# Patient Record
Sex: Female | Born: 1940
Health system: Southern US, Community
[De-identification: ages and names within clinical notes are randomized; demographics above are authoritative.]

## PROBLEM LIST (undated history)

## (undated) DIAGNOSIS — E7800 Pure hypercholesterolemia, unspecified: Secondary | ICD-10-CM

## (undated) DIAGNOSIS — I10 Essential (primary) hypertension: Secondary | ICD-10-CM

---

## 1997-08-29 ENCOUNTER — Other Ambulatory Visit: Admission: RE | Admit: 1997-08-29 | Discharge: 1997-08-29 | Payer: Self-pay | Admitting: Cardiology

## 1998-08-31 ENCOUNTER — Other Ambulatory Visit: Admission: RE | Admit: 1998-08-31 | Discharge: 1998-08-31 | Payer: Self-pay | Admitting: Cardiology

## 1999-09-17 ENCOUNTER — Other Ambulatory Visit: Admission: RE | Admit: 1999-09-17 | Discharge: 1999-09-17 | Payer: Self-pay | Admitting: Internal Medicine

## 1999-09-21 ENCOUNTER — Encounter: Payer: Self-pay | Admitting: Internal Medicine

## 1999-09-21 ENCOUNTER — Encounter: Admission: RE | Admit: 1999-09-21 | Discharge: 1999-09-21 | Payer: Self-pay | Admitting: Internal Medicine

## 2000-03-08 ENCOUNTER — Other Ambulatory Visit: Admission: RE | Admit: 2000-03-08 | Discharge: 2000-03-08 | Payer: Self-pay | Admitting: Internal Medicine

## 2000-05-31 ENCOUNTER — Ambulatory Visit (HOSPITAL_COMMUNITY): Admission: RE | Admit: 2000-05-31 | Discharge: 2000-05-31 | Payer: Self-pay | Admitting: Gastroenterology

## 2000-10-23 ENCOUNTER — Other Ambulatory Visit: Admission: RE | Admit: 2000-10-23 | Discharge: 2000-10-23 | Payer: Self-pay | Admitting: *Deleted

## 2000-10-25 ENCOUNTER — Ambulatory Visit (HOSPITAL_COMMUNITY): Admission: RE | Admit: 2000-10-25 | Discharge: 2000-10-25 | Payer: Self-pay | Admitting: *Deleted

## 2001-08-06 ENCOUNTER — Ambulatory Visit (HOSPITAL_COMMUNITY): Admission: RE | Admit: 2001-08-06 | Discharge: 2001-08-06 | Payer: Self-pay | Admitting: *Deleted

## 2001-10-01 ENCOUNTER — Encounter: Payer: Self-pay | Admitting: Gastroenterology

## 2001-10-01 ENCOUNTER — Encounter: Admission: RE | Admit: 2001-10-01 | Discharge: 2001-10-01 | Payer: Self-pay | Admitting: Gastroenterology

## 2002-04-18 ENCOUNTER — Other Ambulatory Visit: Admission: RE | Admit: 2002-04-18 | Discharge: 2002-04-18 | Payer: Self-pay | Admitting: *Deleted

## 2002-05-01 ENCOUNTER — Ambulatory Visit (HOSPITAL_BASED_OUTPATIENT_CLINIC_OR_DEPARTMENT_OTHER): Admission: RE | Admit: 2002-05-01 | Discharge: 2002-05-01 | Payer: Self-pay | Admitting: Orthopedic Surgery

## 2002-10-03 ENCOUNTER — Encounter: Payer: Self-pay | Admitting: Internal Medicine

## 2002-10-03 ENCOUNTER — Encounter: Admission: RE | Admit: 2002-10-03 | Discharge: 2002-10-03 | Payer: Self-pay | Admitting: Internal Medicine

## 2003-03-26 ENCOUNTER — Encounter: Admission: RE | Admit: 2003-03-26 | Discharge: 2003-03-26 | Payer: Self-pay | Admitting: Internal Medicine

## 2003-04-30 ENCOUNTER — Other Ambulatory Visit: Admission: RE | Admit: 2003-04-30 | Discharge: 2003-04-30 | Payer: Self-pay | Admitting: *Deleted

## 2004-04-05 ENCOUNTER — Encounter: Admission: RE | Admit: 2004-04-05 | Discharge: 2004-04-05 | Payer: Self-pay | Admitting: Internal Medicine

## 2005-04-08 ENCOUNTER — Encounter: Admission: RE | Admit: 2005-04-08 | Discharge: 2005-04-08 | Payer: Self-pay | Admitting: Internal Medicine

## 2006-04-07 ENCOUNTER — Encounter: Admission: RE | Admit: 2006-04-07 | Discharge: 2006-04-07 | Payer: Self-pay | Admitting: Internal Medicine

## 2009-12-30 ENCOUNTER — Encounter
Admission: RE | Admit: 2009-12-30 | Discharge: 2009-12-30 | Payer: Self-pay | Source: Home / Self Care | Attending: Internal Medicine | Admitting: Internal Medicine

## 2010-02-07 ENCOUNTER — Encounter: Payer: Self-pay | Admitting: Internal Medicine

## 2010-11-29 ENCOUNTER — Other Ambulatory Visit: Payer: Self-pay | Admitting: Dermatology

## 2011-02-25 ENCOUNTER — Other Ambulatory Visit: Payer: Self-pay | Admitting: Gastroenterology

## 2013-04-18 ENCOUNTER — Other Ambulatory Visit: Payer: Self-pay | Admitting: Internal Medicine

## 2013-04-18 DIAGNOSIS — R1032 Left lower quadrant pain: Secondary | ICD-10-CM

## 2013-04-24 ENCOUNTER — Ambulatory Visit
Admission: RE | Admit: 2013-04-24 | Discharge: 2013-04-24 | Disposition: A | Discharge status: Home / Self Care | Payer: Medicare Other | Source: Ambulatory Visit | Attending: Internal Medicine | Admitting: Internal Medicine

## 2013-04-24 DIAGNOSIS — R1032 Left lower quadrant pain: Secondary | ICD-10-CM

## 2013-04-24 IMAGING — CT CT ABD-PELV W/ CM
3 of 5 series · 12 of 36 positions shown, 18 images · IV contrast (READICAT/WATER & [ID] OMNI 300)
Comparison: None.

CLINICAL DATA: Left lower quadrant abdominal pain, constipation,
negative colonoscopy 2 years ago

EXAM:
CT ABDOMEN AND PELVIS WITH CONTRAST
TECHNIQUE: Multidetector CT imaging of the abdomen and pelvis was performed
using the standard protocol following bolus administration of
intravenous contrast.
CONTRAST:  100mL OMNIPAQUE IOHEXOL 300 MG/ML  SOLN

[Series 3: abd/pelvis with · axial · 0.78mm/px · z∈[-312,-12]mm · 7 of 80 slices shown, 12 images]
[im 10/80  soft-tissue]
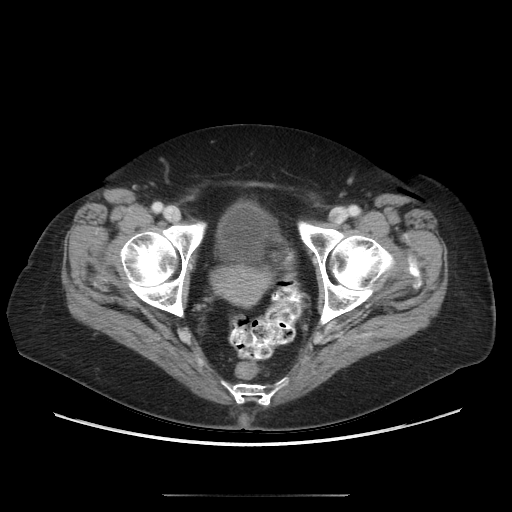
[im 10/80  bone]
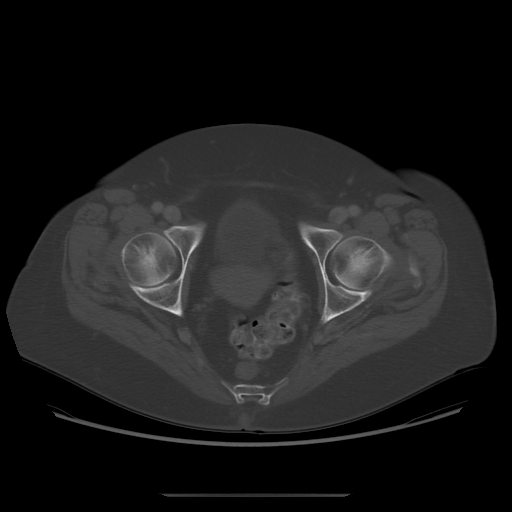
[im 20/80  soft-tissue]
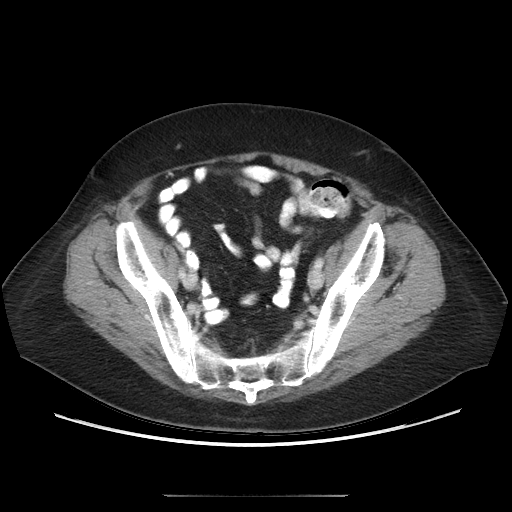
[im 30/80  soft-tissue]
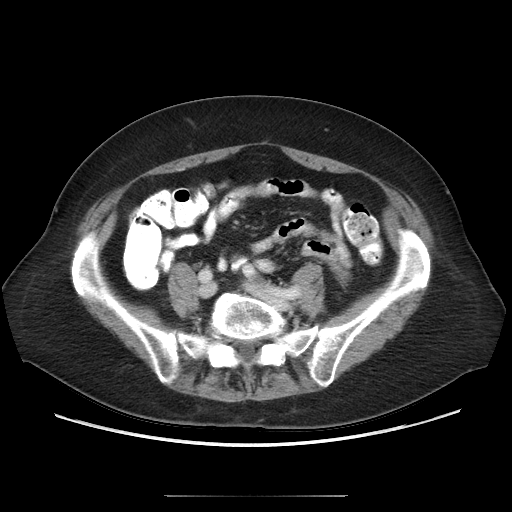
[im 40/80  soft-tissue]
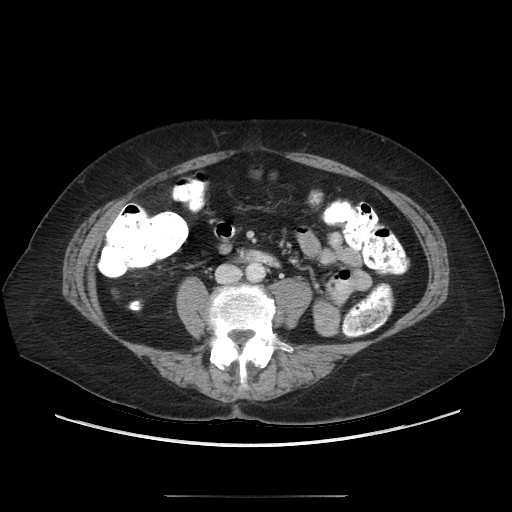
[im 40/80  lung]
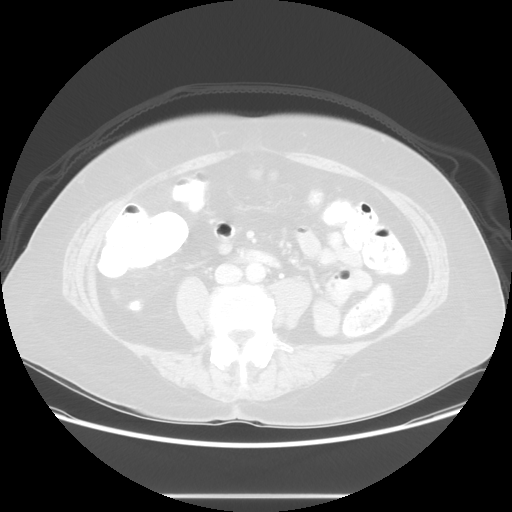
[im 50/80  soft-tissue]
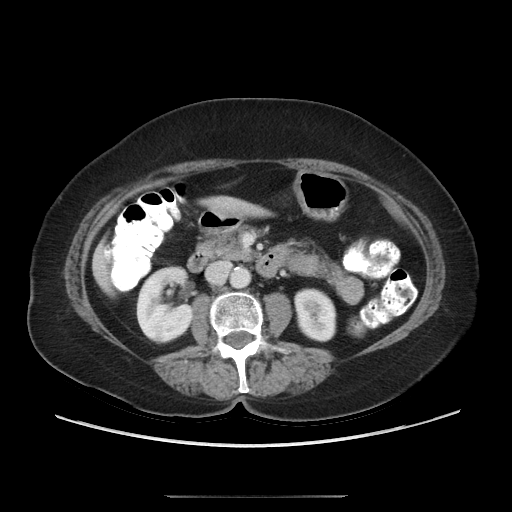
[im 50/80  lung]
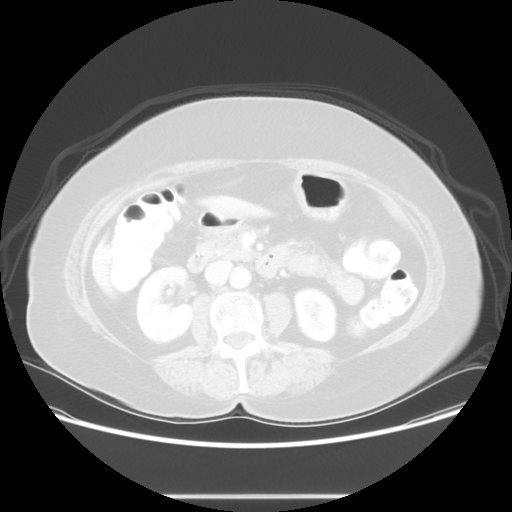
[im 60/80  soft-tissue]
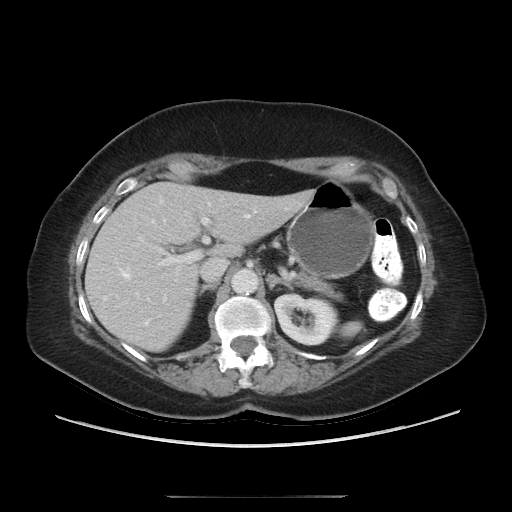
[im 60/80  lung]
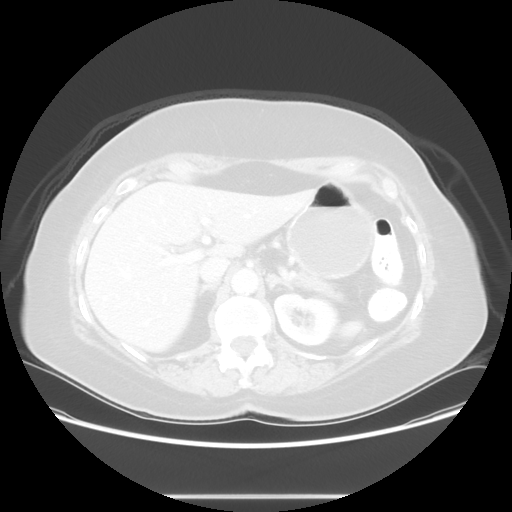
[im 70/80  soft-tissue]
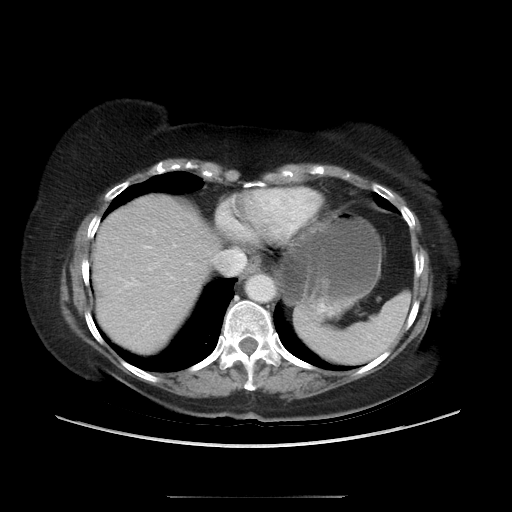
[im 70/80  lung]
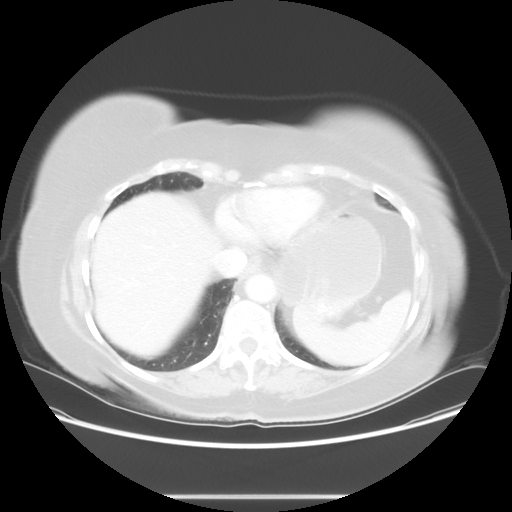

[Series 601: coronal body · coronal · 0.84mm/px · 1 of 114 slices shown, 2 images]
[im 38/114  soft-tissue]
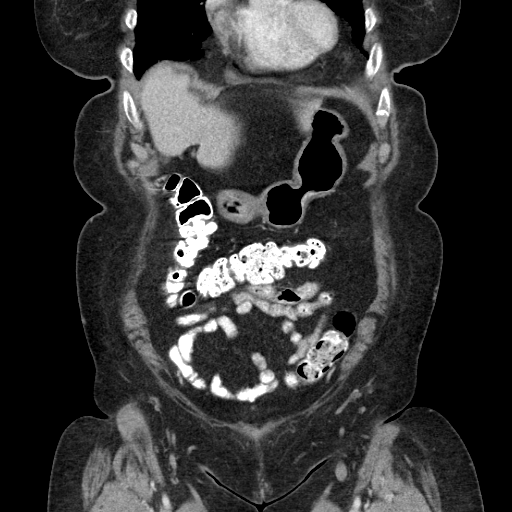
[im 38/114  bone]
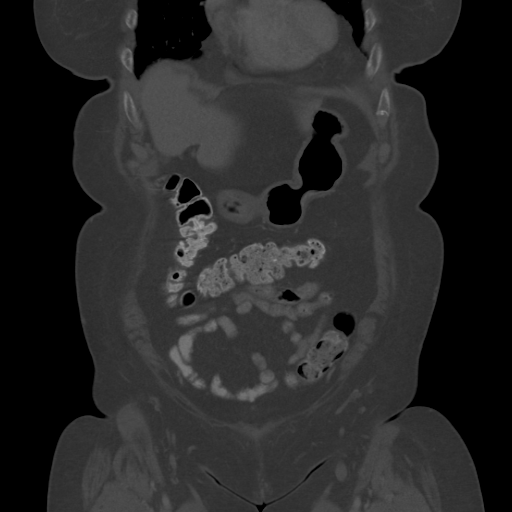

[Series 602: sagittal body · sagittal · 0.84mm/px · 4 of 159 slices shown]
[im 19/159  soft-tissue]
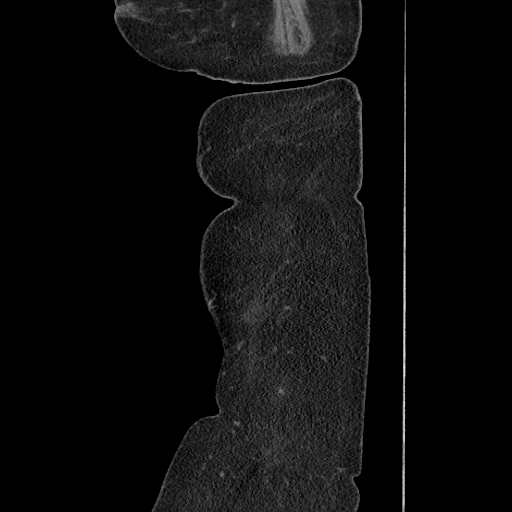
[im 38/159  soft-tissue]
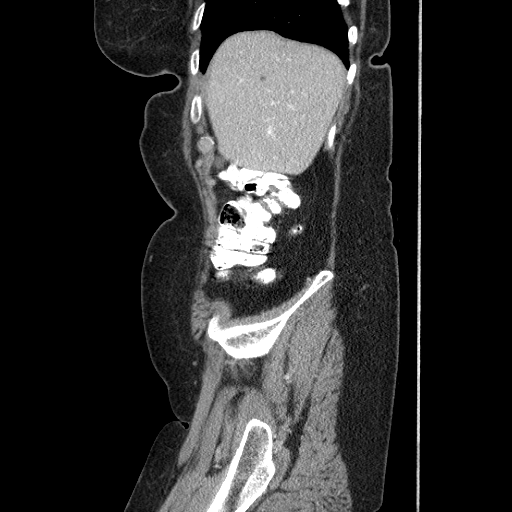
[im 56/159  soft-tissue]
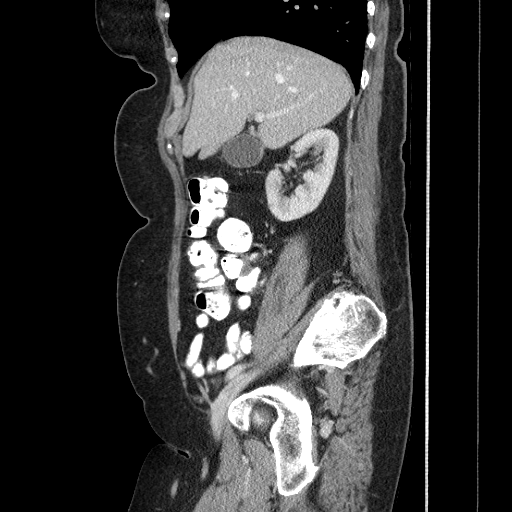
[im 75/159  soft-tissue]
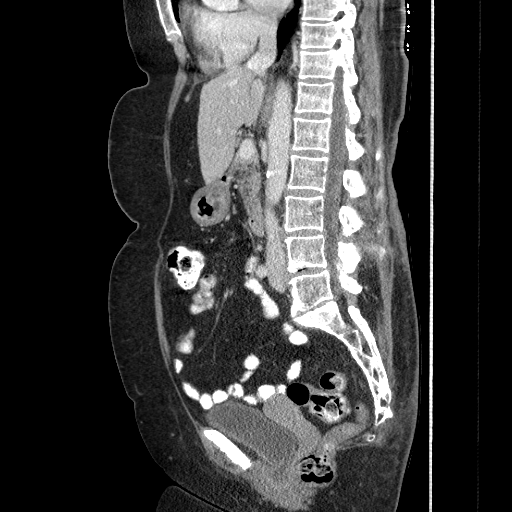

[12 of 36 positions shown; findings below may reference images not displayed]

FINDINGS: The lung bases are clear. The liver enhances with only a single sub
cm low-attenuation structure near the dome of the right lobe most
consistent with a benign process such as a small cyst. No calcified
gallstones are seen, but there are small soft tissue densities
layering dependently in the neck of the gallbladder which could
represent noncalcified gallstones or polyps. The pancreas is normal
in size and the pancreatic duct is not dilated. The adrenal glands
and spleen are unremarkable. The stomach is moderately fluid
distended with no abnormality noted. The kidneys enhance with no
calculus, although there is a small cyst in the lower pole of the
right kidney laterally of 13 mm in diameter. The pelvocaliceal
systems appear unremarkable on the delayed images and the proximal
ureters are normal in caliber. The abdominal aorta is normal in
caliber with only mild atheromatous change present. No adenopathy is
seen.

The urinary bladder is not well distended but no abnormality is
seen. The uterus is normal in size and no adnexal lesion is seen. No
fluid is noted within the pelvis. The colon is not distended but no
gross abnormality is evident. The terminal ileum is unremarkable as
is the appendix. There is degenerative disc disease at both L4-5 and
L5-S1 levels.
IMPRESSION: 1. No explanation for the patient's left lower quadrant pain is
seen. No diverticular disease is evident.
2. The terminal ileum and appendix are unremarkable.
3. Degenerative disc disease is noted at L4-5 and L5-S1.
4. Small soft tissue densities layer within the neck of the
gallbladder may represent noncalcified gallstones. Consider
ultrasound if further assessment is warranted.

## 2013-04-24 MED ORDER — IOHEXOL 300 MG/ML  SOLN
100.0000 mL | Freq: Once | INTRAMUSCULAR | Status: AC | PRN
  Administered 2013-04-24: 100 mL via INTRAVENOUS

## 2016-07-08 ENCOUNTER — Other Ambulatory Visit (HOSPITAL_COMMUNITY): Payer: Self-pay | Admitting: Internal Medicine

## 2016-07-08 DIAGNOSIS — S32009S Unspecified fracture of unspecified lumbar vertebra, sequela: Secondary | ICD-10-CM

## 2016-07-11 ENCOUNTER — Ambulatory Visit (HOSPITAL_COMMUNITY): Payer: Medicare PPO

## 2016-07-12 ENCOUNTER — Ambulatory Visit (HOSPITAL_COMMUNITY): Payer: Medicare PPO

## 2016-07-13 ENCOUNTER — Ambulatory Visit (HOSPITAL_COMMUNITY)
Admission: RE | Admit: 2016-07-13 | Discharge: 2016-07-13 | Disposition: A | Discharge status: Home / Self Care | Payer: Medicare PPO | Source: Ambulatory Visit | Attending: Internal Medicine | Admitting: Internal Medicine

## 2016-07-13 DIAGNOSIS — S32009S Unspecified fracture of unspecified lumbar vertebra, sequela: Secondary | ICD-10-CM

## 2016-07-13 DIAGNOSIS — X58XXXA Exposure to other specified factors, initial encounter: Secondary | ICD-10-CM | POA: Diagnosis not present

## 2016-07-13 DIAGNOSIS — M5136 Other intervertebral disc degeneration, lumbar region: Secondary | ICD-10-CM | POA: Diagnosis not present

## 2016-07-13 DIAGNOSIS — S32009A Unspecified fracture of unspecified lumbar vertebra, initial encounter for closed fracture: Secondary | ICD-10-CM | POA: Insufficient documentation

## 2016-07-13 DIAGNOSIS — M48061 Spinal stenosis, lumbar region without neurogenic claudication: Secondary | ICD-10-CM | POA: Diagnosis not present

## 2018-03-30 DIAGNOSIS — J00 Acute nasopharyngitis [common cold]: Secondary | ICD-10-CM | POA: Diagnosis not present

## 2018-03-30 DIAGNOSIS — I10 Essential (primary) hypertension: Secondary | ICD-10-CM | POA: Diagnosis not present

## 2018-04-11 DIAGNOSIS — I959 Hypotension, unspecified: Secondary | ICD-10-CM | POA: Diagnosis not present

## 2018-04-11 DIAGNOSIS — Z0000 Encounter for general adult medical examination without abnormal findings: Secondary | ICD-10-CM | POA: Diagnosis not present

## 2018-04-11 DIAGNOSIS — E785 Hyperlipidemia, unspecified: Secondary | ICD-10-CM | POA: Diagnosis not present

## 2018-04-11 DIAGNOSIS — N390 Urinary tract infection, site not specified: Secondary | ICD-10-CM | POA: Diagnosis not present

## 2018-04-11 DIAGNOSIS — Z6827 Body mass index (BMI) 27.0-27.9, adult: Secondary | ICD-10-CM | POA: Diagnosis not present

## 2018-04-11 DIAGNOSIS — I10 Essential (primary) hypertension: Secondary | ICD-10-CM | POA: Diagnosis not present

## 2018-04-16 DIAGNOSIS — R32 Unspecified urinary incontinence: Secondary | ICD-10-CM | POA: Diagnosis not present

## 2018-04-16 DIAGNOSIS — Z8249 Family history of ischemic heart disease and other diseases of the circulatory system: Secondary | ICD-10-CM | POA: Diagnosis not present

## 2018-04-16 DIAGNOSIS — M810 Age-related osteoporosis without current pathological fracture: Secondary | ICD-10-CM | POA: Diagnosis not present

## 2018-04-16 DIAGNOSIS — I10 Essential (primary) hypertension: Secondary | ICD-10-CM | POA: Diagnosis not present

## 2018-04-16 DIAGNOSIS — Z882 Allergy status to sulfonamides status: Secondary | ICD-10-CM | POA: Diagnosis not present

## 2018-04-16 DIAGNOSIS — Z809 Family history of malignant neoplasm, unspecified: Secondary | ICD-10-CM | POA: Diagnosis not present

## 2018-04-16 DIAGNOSIS — E785 Hyperlipidemia, unspecified: Secondary | ICD-10-CM | POA: Diagnosis not present

## 2018-08-07 DIAGNOSIS — I10 Essential (primary) hypertension: Secondary | ICD-10-CM | POA: Diagnosis not present

## 2018-08-07 DIAGNOSIS — R946 Abnormal results of thyroid function studies: Secondary | ICD-10-CM | POA: Diagnosis not present

## 2018-08-07 DIAGNOSIS — E538 Deficiency of other specified B group vitamins: Secondary | ICD-10-CM | POA: Diagnosis not present

## 2018-08-07 DIAGNOSIS — E7849 Other hyperlipidemia: Secondary | ICD-10-CM | POA: Diagnosis not present

## 2018-08-08 DIAGNOSIS — I10 Essential (primary) hypertension: Secondary | ICD-10-CM | POA: Diagnosis not present

## 2018-08-08 DIAGNOSIS — R82998 Other abnormal findings in urine: Secondary | ICD-10-CM | POA: Diagnosis not present

## 2018-08-14 DIAGNOSIS — N281 Cyst of kidney, acquired: Secondary | ICD-10-CM | POA: Diagnosis not present

## 2018-08-14 DIAGNOSIS — E538 Deficiency of other specified B group vitamins: Secondary | ICD-10-CM | POA: Diagnosis not present

## 2018-08-14 DIAGNOSIS — E039 Hypothyroidism, unspecified: Secondary | ICD-10-CM | POA: Diagnosis not present

## 2018-08-14 DIAGNOSIS — S32009D Unspecified fracture of unspecified lumbar vertebra, subsequent encounter for fracture with routine healing: Secondary | ICD-10-CM | POA: Diagnosis not present

## 2018-08-14 DIAGNOSIS — Z0000 Encounter for general adult medical examination without abnormal findings: Secondary | ICD-10-CM | POA: Diagnosis not present

## 2018-08-14 DIAGNOSIS — Z1331 Encounter for screening for depression: Secondary | ICD-10-CM | POA: Diagnosis not present

## 2018-08-14 DIAGNOSIS — I868 Varicose veins of other specified sites: Secondary | ICD-10-CM | POA: Diagnosis not present

## 2018-08-14 DIAGNOSIS — D126 Benign neoplasm of colon, unspecified: Secondary | ICD-10-CM | POA: Diagnosis not present

## 2018-08-14 DIAGNOSIS — M810 Age-related osteoporosis without current pathological fracture: Secondary | ICD-10-CM | POA: Diagnosis not present

## 2018-08-23 ENCOUNTER — Other Ambulatory Visit (HOSPITAL_COMMUNITY): Payer: Self-pay | Admitting: *Deleted

## 2018-08-24 ENCOUNTER — Ambulatory Visit (HOSPITAL_COMMUNITY)
Admission: RE | Admit: 2018-08-24 | Discharge: 2018-08-24 | Disposition: A | Discharge status: Home / Self Care | Payer: Medicare HMO | Source: Ambulatory Visit | Attending: Internal Medicine | Admitting: Internal Medicine

## 2018-08-24 ENCOUNTER — Other Ambulatory Visit: Payer: Self-pay

## 2018-08-24 DIAGNOSIS — M810 Age-related osteoporosis without current pathological fracture: Secondary | ICD-10-CM | POA: Diagnosis not present

## 2018-08-24 MED ORDER — DENOSUMAB 60 MG/ML ~~LOC~~ SOSY
60.0000 mg | PREFILLED_SYRINGE | Freq: Once | SUBCUTANEOUS | Status: AC
  Administered 2018-08-24: 60 mg via SUBCUTANEOUS

## 2018-08-24 MED ORDER — DENOSUMAB 60 MG/ML ~~LOC~~ SOSY
PREFILLED_SYRINGE | SUBCUTANEOUS | Status: AC
  Filled 2018-08-24: qty 1

## 2018-08-24 NOTE — Discharge Instructions (Signed)
Denosumab injection °What is this medicine? °DENOSUMAB (den oh sue mab) slows bone breakdown. Prolia is used to treat osteoporosis in women after menopause and in men, and in people who are taking corticosteroids for 6 months or more. Xgeva is used to treat a high calcium level due to cancer and to prevent bone fractures and other bone problems caused by multiple myeloma or cancer bone metastases. Xgeva is also used to treat giant cell tumor of the bone. °This medicine may be used for other purposes; ask your health care provider or pharmacist if you have questions. °COMMON BRAND NAME(S): Prolia, XGEVA °What should I tell my health care provider before I take this medicine? °They need to know if you have any of these conditions: °· dental disease °· having surgery or tooth extraction °· infection °· kidney disease °· low levels of calcium or Vitamin D in the blood °· malnutrition °· on hemodialysis °· skin conditions or sensitivity °· thyroid or parathyroid disease °· an unusual reaction to denosumab, other medicines, foods, dyes, or preservatives °· pregnant or trying to get pregnant °· breast-feeding °How should I use this medicine? °This medicine is for injection under the skin. It is given by a health care professional in a hospital or clinic setting. °A special MedGuide will be given to you before each treatment. Be sure to read this information carefully each time. °For Prolia, talk to your pediatrician regarding the use of this medicine in children. Special care may be needed. For Xgeva, talk to your pediatrician regarding the use of this medicine in children. While this drug may be prescribed for children as young as 13 years for selected conditions, precautions do apply. °Overdosage: If you think you have taken too much of this medicine contact a poison control center or emergency room at once. °NOTE: This medicine is only for you. Do not share this medicine with others. °What if I miss a dose? °It is  important not to miss your dose. Call your doctor or health care professional if you are unable to keep an appointment. °What may interact with this medicine? °Do not take this medicine with any of the following medications: °· other medicines containing denosumab °This medicine may also interact with the following medications: °· medicines that lower your chance of fighting infection °· steroid medicines like prednisone or cortisone °This list may not describe all possible interactions. Give your health care provider a list of all the medicines, herbs, non-prescription drugs, or dietary supplements you use. Also tell them if you smoke, drink alcohol, or use illegal drugs. Some items may interact with your medicine. °What should I watch for while using this medicine? °Visit your doctor or health care professional for regular checks on your progress. Your doctor or health care professional may order blood tests and other tests to see how you are doing. °Call your doctor or health care professional for advice if you get a fever, chills or sore throat, or other symptoms of a cold or flu. Do not treat yourself. This drug may decrease your body's ability to fight infection. Try to avoid being around people who are sick. °You should make sure you get enough calcium and vitamin D while you are taking this medicine, unless your doctor tells you not to. Discuss the foods you eat and the vitamins you take with your health care professional. °See your dentist regularly. Brush and floss your teeth as directed. Before you have any dental work done, tell your dentist you are   receiving this medicine. Do not become pregnant while taking this medicine or for 5 months after stopping it. Talk with your doctor or health care professional about your birth control options while taking this medicine. Women should inform their doctor if they wish to become pregnant or think they might be pregnant. There is a potential for serious side  effects to an unborn child. Talk to your health care professional or pharmacist for more information. What side effects may I notice from receiving this medicine? Side effects that you should report to your doctor or health care professional as soon as possible:  allergic reactions like skin rash, itching or hives, swelling of the face, lips, or tongue  bone pain  breathing problems  dizziness  jaw pain, especially after dental work  redness, blistering, peeling of the skin  signs and symptoms of infection like fever or chills; cough; sore throat; pain or trouble passing urine  signs of low calcium like fast heartbeat, muscle cramps or muscle pain; pain, tingling, numbness in the hands or feet; seizures  unusual bleeding or bruising  unusually weak or tired Side effects that usually do not require medical attention (report to your doctor or health care professional if they continue or are bothersome):  constipation  diarrhea  headache  joint pain  loss of appetite  muscle pain  runny nose  tiredness  upset stomach This list may not describe all possible side effects. Call your doctor for medical advice about side effects. You may report side effects to FDA at 1-800-FDA-1088. Where should I keep my medicine? This medicine is only given in a clinic, doctor's office, or other health care setting and will not be stored at home. NOTE: This sheet is a summary. It may not cover all possible information. If you have questions about this medicine, talk to your doctor, pharmacist, or health care provider.  2020 Elsevier/Gold Standard (2017-05-12 16:10:44)

## 2018-10-05 DIAGNOSIS — Z23 Encounter for immunization: Secondary | ICD-10-CM | POA: Diagnosis not present

## 2018-10-08 DIAGNOSIS — B0089 Other herpesviral infection: Secondary | ICD-10-CM | POA: Diagnosis not present

## 2018-10-08 DIAGNOSIS — D692 Other nonthrombocytopenic purpura: Secondary | ICD-10-CM | POA: Diagnosis not present

## 2018-10-08 DIAGNOSIS — L821 Other seborrheic keratosis: Secondary | ICD-10-CM | POA: Diagnosis not present

## 2018-10-08 DIAGNOSIS — L738 Other specified follicular disorders: Secondary | ICD-10-CM | POA: Diagnosis not present

## 2018-10-29 DIAGNOSIS — Z1231 Encounter for screening mammogram for malignant neoplasm of breast: Secondary | ICD-10-CM | POA: Diagnosis not present

## 2018-11-07 DIAGNOSIS — R946 Abnormal results of thyroid function studies: Secondary | ICD-10-CM | POA: Diagnosis not present

## 2018-11-07 DIAGNOSIS — E538 Deficiency of other specified B group vitamins: Secondary | ICD-10-CM | POA: Diagnosis not present

## 2019-02-11 ENCOUNTER — Ambulatory Visit: Payer: BC Managed Care – PPO | Attending: Internal Medicine

## 2019-02-11 DIAGNOSIS — Z23 Encounter for immunization: Secondary | ICD-10-CM | POA: Insufficient documentation

## 2019-02-11 NOTE — Progress Notes (Signed)
   Covid-19 Vaccination Clinic  Name:  Melissa Tapia    MRN: [id number] DOB: 1940/09/18  02/11/2019  Ms. Melissa Tapia was observed post Covid-19 immunization for 15 minutes without incidence. She was provided with Vaccine Information Sheet and instruction to access the V-Safe system.   Ms. Melissa Tapia was instructed to call 911 with any severe reactions post vaccine: Marland Kitchen Difficulty breathing  . Swelling of your face and throat  . A fast heartbeat  . A bad rash all over your body  . Dizziness and weakness    Immunizations Administered    Name Date Dose VIS Date Route   Pfizer COVID-19 Vaccine 02/11/2019  8:56 AM 0.3 mL 12/28/2018 Intramuscular   Manufacturer: [place]   Lot: [id number]   [place]: [id number]

## 2019-02-15 DIAGNOSIS — M810 Age-related osteoporosis without current pathological fracture: Secondary | ICD-10-CM | POA: Diagnosis not present

## 2019-02-15 DIAGNOSIS — I10 Essential (primary) hypertension: Secondary | ICD-10-CM | POA: Diagnosis not present

## 2019-02-15 DIAGNOSIS — E039 Hypothyroidism, unspecified: Secondary | ICD-10-CM | POA: Diagnosis not present

## 2019-02-15 DIAGNOSIS — K5909 Other constipation: Secondary | ICD-10-CM | POA: Diagnosis not present

## 2019-02-15 DIAGNOSIS — K649 Unspecified hemorrhoids: Secondary | ICD-10-CM | POA: Diagnosis not present

## 2019-02-15 DIAGNOSIS — M5136 Other intervertebral disc degeneration, lumbar region: Secondary | ICD-10-CM | POA: Diagnosis not present

## 2019-02-15 DIAGNOSIS — I959 Hypotension, unspecified: Secondary | ICD-10-CM | POA: Diagnosis not present

## 2019-03-04 ENCOUNTER — Ambulatory Visit: Payer: BC Managed Care – PPO | Attending: Internal Medicine

## 2019-03-04 DIAGNOSIS — Z23 Encounter for immunization: Secondary | ICD-10-CM

## 2019-03-04 NOTE — Progress Notes (Signed)
   Covid-19 Vaccination Clinic  Name:  Melissa Tapia    MRN: [id number] DOB: Aug 12, 1940  03/04/2019  [name] was observed post Covid-19 immunization for 15 minutes without incidence. She was provided with Vaccine Information Sheet and instruction to access the V-Safe system.   [name] was instructed to call 911 with any severe reactions post vaccine: Marland Kitchen Difficulty breathing  . Swelling of your face and throat  . A fast heartbeat  . A bad rash all over your body  . Dizziness and weakness    Immunizations Administered    Name Date Dose VIS Date Route   Pfizer COVID-19 Vaccine 03/04/2019  8:24 AM 0.3 mL 12/28/2018 Intramuscular   Manufacturer: [place]   Lot: [id number]   [place]: [id number]

## 2019-03-25 ENCOUNTER — Other Ambulatory Visit (HOSPITAL_COMMUNITY): Payer: Self-pay | Admitting: *Deleted

## 2019-03-26 ENCOUNTER — Ambulatory Visit (HOSPITAL_COMMUNITY)
Admission: RE | Admit: 2019-03-26 | Discharge: 2019-03-26 | Disposition: A | Discharge status: Home / Self Care | Payer: Medicare HMO | Source: Ambulatory Visit | Attending: Internal Medicine | Admitting: Internal Medicine

## 2019-03-26 ENCOUNTER — Other Ambulatory Visit: Payer: Self-pay

## 2019-03-26 DIAGNOSIS — M810 Age-related osteoporosis without current pathological fracture: Secondary | ICD-10-CM | POA: Insufficient documentation

## 2019-03-26 MED ORDER — DENOSUMAB 60 MG/ML ~~LOC~~ SOSY
PREFILLED_SYRINGE | SUBCUTANEOUS | Status: AC
  Filled 2019-03-26: qty 1

## 2019-03-26 MED ORDER — DENOSUMAB 60 MG/ML ~~LOC~~ SOSY
60.0000 mg | PREFILLED_SYRINGE | Freq: Once | SUBCUTANEOUS | Status: AC
  Administered 2019-03-26: 10:00:00 60 mg via SUBCUTANEOUS

## 2019-04-16 DIAGNOSIS — I10 Essential (primary) hypertension: Secondary | ICD-10-CM | POA: Diagnosis not present

## 2019-04-16 DIAGNOSIS — R32 Unspecified urinary incontinence: Secondary | ICD-10-CM | POA: Diagnosis not present

## 2019-04-16 DIAGNOSIS — Z8249 Family history of ischemic heart disease and other diseases of the circulatory system: Secondary | ICD-10-CM | POA: Diagnosis not present

## 2019-04-16 DIAGNOSIS — Z809 Family history of malignant neoplasm, unspecified: Secondary | ICD-10-CM | POA: Diagnosis not present

## 2019-04-16 DIAGNOSIS — E785 Hyperlipidemia, unspecified: Secondary | ICD-10-CM | POA: Diagnosis not present

## 2019-04-16 DIAGNOSIS — K5900 Constipation, unspecified: Secondary | ICD-10-CM | POA: Diagnosis not present

## 2019-04-16 DIAGNOSIS — Z79899 Other long term (current) drug therapy: Secondary | ICD-10-CM | POA: Diagnosis not present

## 2019-04-16 DIAGNOSIS — M810 Age-related osteoporosis without current pathological fracture: Secondary | ICD-10-CM | POA: Diagnosis not present

## 2019-04-23 DIAGNOSIS — Z800 Family history of malignant neoplasm of digestive organs: Secondary | ICD-10-CM | POA: Diagnosis not present

## 2019-04-23 DIAGNOSIS — K921 Melena: Secondary | ICD-10-CM | POA: Diagnosis not present

## 2019-05-02 DIAGNOSIS — E538 Deficiency of other specified B group vitamins: Secondary | ICD-10-CM | POA: Diagnosis not present

## 2019-05-20 DIAGNOSIS — Z1159 Encounter for screening for other viral diseases: Secondary | ICD-10-CM | POA: Diagnosis not present

## 2019-05-23 DIAGNOSIS — K5730 Diverticulosis of large intestine without perforation or abscess without bleeding: Secondary | ICD-10-CM | POA: Diagnosis not present

## 2019-05-23 DIAGNOSIS — Z800 Family history of malignant neoplasm of digestive organs: Secondary | ICD-10-CM | POA: Diagnosis not present

## 2019-05-23 DIAGNOSIS — K921 Melena: Secondary | ICD-10-CM | POA: Diagnosis not present

## 2019-08-14 DIAGNOSIS — E7849 Other hyperlipidemia: Secondary | ICD-10-CM | POA: Diagnosis not present

## 2019-08-14 DIAGNOSIS — Z0000 Encounter for general adult medical examination without abnormal findings: Secondary | ICD-10-CM | POA: Diagnosis not present

## 2019-08-14 DIAGNOSIS — E038 Other specified hypothyroidism: Secondary | ICD-10-CM | POA: Diagnosis not present

## 2019-08-14 DIAGNOSIS — I10 Essential (primary) hypertension: Secondary | ICD-10-CM | POA: Diagnosis not present

## 2019-08-14 DIAGNOSIS — E538 Deficiency of other specified B group vitamins: Secondary | ICD-10-CM | POA: Diagnosis not present

## 2019-08-20 ENCOUNTER — Other Ambulatory Visit: Payer: Self-pay | Admitting: Internal Medicine

## 2019-08-20 DIAGNOSIS — E538 Deficiency of other specified B group vitamins: Secondary | ICD-10-CM | POA: Diagnosis not present

## 2019-08-20 DIAGNOSIS — E039 Hypothyroidism, unspecified: Secondary | ICD-10-CM | POA: Diagnosis not present

## 2019-08-20 DIAGNOSIS — N3281 Overactive bladder: Secondary | ICD-10-CM | POA: Diagnosis not present

## 2019-08-20 DIAGNOSIS — R82998 Other abnormal findings in urine: Secondary | ICD-10-CM | POA: Diagnosis not present

## 2019-08-20 DIAGNOSIS — M810 Age-related osteoporosis without current pathological fracture: Secondary | ICD-10-CM | POA: Diagnosis not present

## 2019-08-20 DIAGNOSIS — Z1212 Encounter for screening for malignant neoplasm of rectum: Secondary | ICD-10-CM | POA: Diagnosis not present

## 2019-08-20 DIAGNOSIS — L659 Nonscarring hair loss, unspecified: Secondary | ICD-10-CM | POA: Diagnosis not present

## 2019-08-20 DIAGNOSIS — I868 Varicose veins of other specified sites: Secondary | ICD-10-CM | POA: Diagnosis not present

## 2019-08-20 DIAGNOSIS — I10 Essential (primary) hypertension: Secondary | ICD-10-CM | POA: Diagnosis not present

## 2019-08-20 DIAGNOSIS — Z0000 Encounter for general adult medical examination without abnormal findings: Secondary | ICD-10-CM | POA: Diagnosis not present

## 2019-08-20 DIAGNOSIS — H9190 Unspecified hearing loss, unspecified ear: Secondary | ICD-10-CM | POA: Diagnosis not present

## 2019-08-21 ENCOUNTER — Other Ambulatory Visit: Payer: Self-pay | Admitting: Internal Medicine

## 2019-08-21 DIAGNOSIS — E785 Hyperlipidemia, unspecified: Secondary | ICD-10-CM

## 2019-09-04 ENCOUNTER — Other Ambulatory Visit: Payer: BC Managed Care – PPO

## 2019-09-04 ENCOUNTER — Ambulatory Visit
Admission: RE | Admit: 2019-09-04 | Discharge: 2019-09-04 | Disposition: A | Discharge status: Home / Self Care | Payer: BC Managed Care – PPO | Source: Ambulatory Visit | Attending: Internal Medicine | Admitting: Internal Medicine

## 2019-09-04 ENCOUNTER — Other Ambulatory Visit: Payer: Self-pay

## 2019-09-04 DIAGNOSIS — M810 Age-related osteoporosis without current pathological fracture: Secondary | ICD-10-CM

## 2019-09-04 DIAGNOSIS — Z780 Asymptomatic menopausal state: Secondary | ICD-10-CM | POA: Diagnosis not present

## 2019-09-04 DIAGNOSIS — M85852 Other specified disorders of bone density and structure, left thigh: Secondary | ICD-10-CM | POA: Diagnosis not present

## 2019-09-05 ENCOUNTER — Other Ambulatory Visit: Payer: BC Managed Care – PPO

## 2019-09-11 ENCOUNTER — Ambulatory Visit
Admission: RE | Admit: 2019-09-11 | Discharge: 2019-09-11 | Disposition: A | Discharge status: Home / Self Care | Payer: Self-pay | Source: Ambulatory Visit | Attending: Internal Medicine | Admitting: Internal Medicine

## 2019-09-11 DIAGNOSIS — E785 Hyperlipidemia, unspecified: Secondary | ICD-10-CM

## 2019-09-27 ENCOUNTER — Other Ambulatory Visit (HOSPITAL_COMMUNITY): Payer: Self-pay | Admitting: *Deleted

## 2019-09-30 ENCOUNTER — Ambulatory Visit (HOSPITAL_COMMUNITY)
Admission: RE | Admit: 2019-09-30 | Discharge: 2019-09-30 | Disposition: A | Discharge status: Home / Self Care | Payer: Medicare HMO | Source: Ambulatory Visit | Attending: Internal Medicine | Admitting: Internal Medicine

## 2019-09-30 ENCOUNTER — Other Ambulatory Visit: Payer: Self-pay

## 2019-09-30 DIAGNOSIS — M810 Age-related osteoporosis without current pathological fracture: Secondary | ICD-10-CM | POA: Insufficient documentation

## 2019-09-30 MED ORDER — DENOSUMAB 60 MG/ML ~~LOC~~ SOSY
PREFILLED_SYRINGE | SUBCUTANEOUS | Status: AC
  Administered 2019-09-30: 60 mg
  Filled 2019-09-30: qty 1

## 2019-09-30 MED ORDER — DENOSUMAB 60 MG/ML ~~LOC~~ SOSY: 60.0000 mg | PREFILLED_SYRINGE | Freq: Once | SUBCUTANEOUS | Status: DC

## 2019-09-30 NOTE — Discharge Instructions (Signed)
Denosumab injection What is this medicine? DENOSUMAB (den oh sue mab) slows bone breakdown. Prolia is used to treat osteoporosis in women after menopause and in men, and in people who are taking corticosteroids for 6 months or more. Xgeva is used to treat a high calcium level due to cancer and to prevent bone fractures and other bone problems caused by multiple myeloma or cancer bone metastases. Xgeva is also used to treat giant cell tumor of the bone. This medicine may be used for other purposes; ask your health care provider or pharmacist if you have questions. COMMON BRAND NAME(S): Prolia, XGEVA What should I tell my health care provider before I take this medicine? They need to know if you have any of these conditions:  dental disease  having surgery or tooth extraction  infection  kidney disease  low levels of calcium or Vitamin D in the blood  malnutrition  on hemodialysis  skin conditions or sensitivity  thyroid or parathyroid disease  an unusual reaction to denosumab, other medicines, foods, dyes, or preservatives  pregnant or trying to get pregnant  breast-feeding How should I use this medicine? This medicine is for injection under the skin. It is given by a health care professional in a hospital or clinic setting. A special MedGuide will be given to you before each treatment. Be sure to read this information carefully each time. For Prolia, talk to your pediatrician regarding the use of this medicine in children. Special care may be needed. For Xgeva, talk to your pediatrician regarding the use of this medicine in children. While this drug may be prescribed for children as young as 13 years for selected conditions, precautions do apply. Overdosage: If you think you have taken too much of this medicine contact a poison control center or emergency room at once. NOTE: This medicine is only for you. Do not share this medicine with others. What if I miss a dose? It is  important not to miss your dose. Call your doctor or health care professional if you are unable to keep an appointment. What may interact with this medicine? Do not take this medicine with any of the following medications:  other medicines containing denosumab This medicine may also interact with the following medications:  medicines that lower your chance of fighting infection  steroid medicines like prednisone or cortisone This list may not describe all possible interactions. Give your health care provider a list of all the medicines, herbs, non-prescription drugs, or dietary supplements you use. Also tell them if you smoke, drink alcohol, or use illegal drugs. Some items may interact with your medicine. What should I watch for while using this medicine? Visit your doctor or health care professional for regular checks on your progress. Your doctor or health care professional may order blood tests and other tests to see how you are doing. Call your doctor or health care professional for advice if you get a fever, chills or sore throat, or other symptoms of a cold or flu. Do not treat yourself. This drug may decrease your body's ability to fight infection. Try to avoid being around people who are sick. You should make sure you get enough calcium and vitamin D while you are taking this medicine, unless your doctor tells you not to. Discuss the foods you eat and the vitamins you take with your health care professional. See your dentist regularly. Brush and floss your teeth as directed. Before you have any dental work done, tell your dentist you are   receiving this medicine. Do not become pregnant while taking this medicine or for 5 months after stopping it. Talk with your doctor or health care professional about your birth control options while taking this medicine. Women should inform their doctor if they wish to become pregnant or think they might be pregnant. There is a potential for serious side  effects to an unborn child. Talk to your health care professional or pharmacist for more information. What side effects may I notice from receiving this medicine? Side effects that you should report to your doctor or health care professional as soon as possible:  allergic reactions like skin rash, itching or hives, swelling of the face, lips, or tongue  bone pain  breathing problems  dizziness  jaw pain, especially after dental work  redness, blistering, peeling of the skin  signs and symptoms of infection like fever or chills; cough; sore throat; pain or trouble passing urine  signs of low calcium like fast heartbeat, muscle cramps or muscle pain; pain, tingling, numbness in the hands or feet; seizures  unusual bleeding or bruising  unusually weak or tired Side effects that usually do not require medical attention (report to your doctor or health care professional if they continue or are bothersome):  constipation  diarrhea  headache  joint pain  loss of appetite  muscle pain  runny nose  tiredness  upset stomach This list may not describe all possible side effects. Call your doctor for medical advice about side effects. You may report side effects to FDA at 1-800-FDA-1088. Where should I keep my medicine? This medicine is only given in a clinic, doctor's office, or other health care setting and will not be stored at home. NOTE: This sheet is a summary. It may not cover all possible information. If you have questions about this medicine, talk to your doctor, pharmacist, or health care provider.  2020 Elsevier/Gold Standard (2017-05-12 16:10:44)

## 2019-10-16 DIAGNOSIS — L820 Inflamed seborrheic keratosis: Secondary | ICD-10-CM | POA: Diagnosis not present

## 2019-10-16 DIAGNOSIS — L821 Other seborrheic keratosis: Secondary | ICD-10-CM | POA: Diagnosis not present

## 2019-10-16 DIAGNOSIS — L738 Other specified follicular disorders: Secondary | ICD-10-CM | POA: Diagnosis not present

## 2019-10-19 DIAGNOSIS — Z23 Encounter for immunization: Secondary | ICD-10-CM | POA: Diagnosis not present

## 2019-10-29 DIAGNOSIS — M5116 Intervertebral disc disorders with radiculopathy, lumbar region: Secondary | ICD-10-CM | POA: Diagnosis not present

## 2019-10-29 DIAGNOSIS — M25552 Pain in left hip: Secondary | ICD-10-CM | POA: Diagnosis not present

## 2019-10-29 DIAGNOSIS — M9905 Segmental and somatic dysfunction of pelvic region: Secondary | ICD-10-CM | POA: Diagnosis not present

## 2019-10-29 DIAGNOSIS — M9903 Segmental and somatic dysfunction of lumbar region: Secondary | ICD-10-CM | POA: Diagnosis not present

## 2019-10-31 DIAGNOSIS — M25552 Pain in left hip: Secondary | ICD-10-CM | POA: Diagnosis not present

## 2019-10-31 DIAGNOSIS — M9905 Segmental and somatic dysfunction of pelvic region: Secondary | ICD-10-CM | POA: Diagnosis not present

## 2019-10-31 DIAGNOSIS — M9903 Segmental and somatic dysfunction of lumbar region: Secondary | ICD-10-CM | POA: Diagnosis not present

## 2019-10-31 DIAGNOSIS — M5116 Intervertebral disc disorders with radiculopathy, lumbar region: Secondary | ICD-10-CM | POA: Diagnosis not present

## 2019-11-04 DIAGNOSIS — M9903 Segmental and somatic dysfunction of lumbar region: Secondary | ICD-10-CM | POA: Diagnosis not present

## 2019-11-04 DIAGNOSIS — M5116 Intervertebral disc disorders with radiculopathy, lumbar region: Secondary | ICD-10-CM | POA: Diagnosis not present

## 2019-11-04 DIAGNOSIS — Z1231 Encounter for screening mammogram for malignant neoplasm of breast: Secondary | ICD-10-CM | POA: Diagnosis not present

## 2019-11-04 DIAGNOSIS — M9905 Segmental and somatic dysfunction of pelvic region: Secondary | ICD-10-CM | POA: Diagnosis not present

## 2019-11-04 DIAGNOSIS — M25552 Pain in left hip: Secondary | ICD-10-CM | POA: Diagnosis not present

## 2019-11-06 DIAGNOSIS — M5116 Intervertebral disc disorders with radiculopathy, lumbar region: Secondary | ICD-10-CM | POA: Diagnosis not present

## 2019-11-06 DIAGNOSIS — M9903 Segmental and somatic dysfunction of lumbar region: Secondary | ICD-10-CM | POA: Diagnosis not present

## 2019-11-06 DIAGNOSIS — M25552 Pain in left hip: Secondary | ICD-10-CM | POA: Diagnosis not present

## 2019-11-06 DIAGNOSIS — M9905 Segmental and somatic dysfunction of pelvic region: Secondary | ICD-10-CM | POA: Diagnosis not present

## 2019-11-11 DIAGNOSIS — M9905 Segmental and somatic dysfunction of pelvic region: Secondary | ICD-10-CM | POA: Diagnosis not present

## 2019-11-11 DIAGNOSIS — M9903 Segmental and somatic dysfunction of lumbar region: Secondary | ICD-10-CM | POA: Diagnosis not present

## 2019-11-11 DIAGNOSIS — M25552 Pain in left hip: Secondary | ICD-10-CM | POA: Diagnosis not present

## 2019-11-11 DIAGNOSIS — M5116 Intervertebral disc disorders with radiculopathy, lumbar region: Secondary | ICD-10-CM | POA: Diagnosis not present

## 2019-11-12 DIAGNOSIS — M5116 Intervertebral disc disorders with radiculopathy, lumbar region: Secondary | ICD-10-CM | POA: Diagnosis not present

## 2019-11-12 DIAGNOSIS — M25552 Pain in left hip: Secondary | ICD-10-CM | POA: Diagnosis not present

## 2019-11-12 DIAGNOSIS — M9905 Segmental and somatic dysfunction of pelvic region: Secondary | ICD-10-CM | POA: Diagnosis not present

## 2019-11-12 DIAGNOSIS — M9903 Segmental and somatic dysfunction of lumbar region: Secondary | ICD-10-CM | POA: Diagnosis not present

## 2019-11-14 DIAGNOSIS — M5116 Intervertebral disc disorders with radiculopathy, lumbar region: Secondary | ICD-10-CM | POA: Diagnosis not present

## 2019-11-14 DIAGNOSIS — M9905 Segmental and somatic dysfunction of pelvic region: Secondary | ICD-10-CM | POA: Diagnosis not present

## 2019-11-14 DIAGNOSIS — M25552 Pain in left hip: Secondary | ICD-10-CM | POA: Diagnosis not present

## 2019-11-14 DIAGNOSIS — M9903 Segmental and somatic dysfunction of lumbar region: Secondary | ICD-10-CM | POA: Diagnosis not present

## 2019-11-18 DIAGNOSIS — M9905 Segmental and somatic dysfunction of pelvic region: Secondary | ICD-10-CM | POA: Diagnosis not present

## 2019-11-18 DIAGNOSIS — M9903 Segmental and somatic dysfunction of lumbar region: Secondary | ICD-10-CM | POA: Diagnosis not present

## 2019-11-18 DIAGNOSIS — M25552 Pain in left hip: Secondary | ICD-10-CM | POA: Diagnosis not present

## 2019-11-18 DIAGNOSIS — M5116 Intervertebral disc disorders with radiculopathy, lumbar region: Secondary | ICD-10-CM | POA: Diagnosis not present

## 2019-11-19 DIAGNOSIS — M5116 Intervertebral disc disorders with radiculopathy, lumbar region: Secondary | ICD-10-CM | POA: Diagnosis not present

## 2019-11-19 DIAGNOSIS — M25552 Pain in left hip: Secondary | ICD-10-CM | POA: Diagnosis not present

## 2019-11-19 DIAGNOSIS — M9905 Segmental and somatic dysfunction of pelvic region: Secondary | ICD-10-CM | POA: Diagnosis not present

## 2019-11-19 DIAGNOSIS — M9903 Segmental and somatic dysfunction of lumbar region: Secondary | ICD-10-CM | POA: Diagnosis not present

## 2019-11-21 DIAGNOSIS — M9905 Segmental and somatic dysfunction of pelvic region: Secondary | ICD-10-CM | POA: Diagnosis not present

## 2019-11-21 DIAGNOSIS — M5116 Intervertebral disc disorders with radiculopathy, lumbar region: Secondary | ICD-10-CM | POA: Diagnosis not present

## 2019-11-21 DIAGNOSIS — M9903 Segmental and somatic dysfunction of lumbar region: Secondary | ICD-10-CM | POA: Diagnosis not present

## 2019-11-21 DIAGNOSIS — M25552 Pain in left hip: Secondary | ICD-10-CM | POA: Diagnosis not present

## 2019-11-25 DIAGNOSIS — M9905 Segmental and somatic dysfunction of pelvic region: Secondary | ICD-10-CM | POA: Diagnosis not present

## 2019-11-25 DIAGNOSIS — M5116 Intervertebral disc disorders with radiculopathy, lumbar region: Secondary | ICD-10-CM | POA: Diagnosis not present

## 2019-11-25 DIAGNOSIS — M9903 Segmental and somatic dysfunction of lumbar region: Secondary | ICD-10-CM | POA: Diagnosis not present

## 2019-11-25 DIAGNOSIS — M25552 Pain in left hip: Secondary | ICD-10-CM | POA: Diagnosis not present

## 2019-11-28 DIAGNOSIS — M5116 Intervertebral disc disorders with radiculopathy, lumbar region: Secondary | ICD-10-CM | POA: Diagnosis not present

## 2019-11-28 DIAGNOSIS — M9905 Segmental and somatic dysfunction of pelvic region: Secondary | ICD-10-CM | POA: Diagnosis not present

## 2019-11-28 DIAGNOSIS — H524 Presbyopia: Secondary | ICD-10-CM | POA: Diagnosis not present

## 2019-11-28 DIAGNOSIS — Z0100 Encounter for examination of eyes and vision without abnormal findings: Secondary | ICD-10-CM | POA: Diagnosis not present

## 2019-11-28 DIAGNOSIS — M9903 Segmental and somatic dysfunction of lumbar region: Secondary | ICD-10-CM | POA: Diagnosis not present

## 2019-11-28 DIAGNOSIS — M25552 Pain in left hip: Secondary | ICD-10-CM | POA: Diagnosis not present

## 2019-12-02 DIAGNOSIS — M5116 Intervertebral disc disorders with radiculopathy, lumbar region: Secondary | ICD-10-CM | POA: Diagnosis not present

## 2019-12-02 DIAGNOSIS — M9903 Segmental and somatic dysfunction of lumbar region: Secondary | ICD-10-CM | POA: Diagnosis not present

## 2019-12-02 DIAGNOSIS — M25552 Pain in left hip: Secondary | ICD-10-CM | POA: Diagnosis not present

## 2019-12-02 DIAGNOSIS — M9905 Segmental and somatic dysfunction of pelvic region: Secondary | ICD-10-CM | POA: Diagnosis not present

## 2019-12-04 DIAGNOSIS — M9905 Segmental and somatic dysfunction of pelvic region: Secondary | ICD-10-CM | POA: Diagnosis not present

## 2019-12-04 DIAGNOSIS — M25552 Pain in left hip: Secondary | ICD-10-CM | POA: Diagnosis not present

## 2019-12-04 DIAGNOSIS — M9903 Segmental and somatic dysfunction of lumbar region: Secondary | ICD-10-CM | POA: Diagnosis not present

## 2019-12-04 DIAGNOSIS — M5116 Intervertebral disc disorders with radiculopathy, lumbar region: Secondary | ICD-10-CM | POA: Diagnosis not present

## 2019-12-11 DIAGNOSIS — M9905 Segmental and somatic dysfunction of pelvic region: Secondary | ICD-10-CM | POA: Diagnosis not present

## 2019-12-11 DIAGNOSIS — M25552 Pain in left hip: Secondary | ICD-10-CM | POA: Diagnosis not present

## 2019-12-11 DIAGNOSIS — M9903 Segmental and somatic dysfunction of lumbar region: Secondary | ICD-10-CM | POA: Diagnosis not present

## 2019-12-11 DIAGNOSIS — M5116 Intervertebral disc disorders with radiculopathy, lumbar region: Secondary | ICD-10-CM | POA: Diagnosis not present

## 2019-12-18 DIAGNOSIS — M5116 Intervertebral disc disorders with radiculopathy, lumbar region: Secondary | ICD-10-CM | POA: Diagnosis not present

## 2019-12-18 DIAGNOSIS — M9903 Segmental and somatic dysfunction of lumbar region: Secondary | ICD-10-CM | POA: Diagnosis not present

## 2019-12-18 DIAGNOSIS — M9905 Segmental and somatic dysfunction of pelvic region: Secondary | ICD-10-CM | POA: Diagnosis not present

## 2019-12-18 DIAGNOSIS — M25552 Pain in left hip: Secondary | ICD-10-CM | POA: Diagnosis not present

## 2019-12-26 DIAGNOSIS — E039 Hypothyroidism, unspecified: Secondary | ICD-10-CM | POA: Diagnosis not present

## 2020-01-01 DIAGNOSIS — M9903 Segmental and somatic dysfunction of lumbar region: Secondary | ICD-10-CM | POA: Diagnosis not present

## 2020-01-01 DIAGNOSIS — M25552 Pain in left hip: Secondary | ICD-10-CM | POA: Diagnosis not present

## 2020-01-01 DIAGNOSIS — M9905 Segmental and somatic dysfunction of pelvic region: Secondary | ICD-10-CM | POA: Diagnosis not present

## 2020-01-01 DIAGNOSIS — M5116 Intervertebral disc disorders with radiculopathy, lumbar region: Secondary | ICD-10-CM | POA: Diagnosis not present

## 2020-03-16 DIAGNOSIS — M9905 Segmental and somatic dysfunction of pelvic region: Secondary | ICD-10-CM | POA: Diagnosis not present

## 2020-03-16 DIAGNOSIS — M9903 Segmental and somatic dysfunction of lumbar region: Secondary | ICD-10-CM | POA: Diagnosis not present

## 2020-03-16 DIAGNOSIS — M25552 Pain in left hip: Secondary | ICD-10-CM | POA: Diagnosis not present

## 2020-03-16 DIAGNOSIS — M5116 Intervertebral disc disorders with radiculopathy, lumbar region: Secondary | ICD-10-CM | POA: Diagnosis not present

## 2020-03-24 DIAGNOSIS — Z79899 Other long term (current) drug therapy: Secondary | ICD-10-CM | POA: Diagnosis not present

## 2020-03-24 DIAGNOSIS — M810 Age-related osteoporosis without current pathological fracture: Secondary | ICD-10-CM | POA: Diagnosis not present

## 2020-04-01 ENCOUNTER — Encounter (HOSPITAL_COMMUNITY): Payer: BC Managed Care – PPO

## 2020-04-29 ENCOUNTER — Other Ambulatory Visit (HOSPITAL_COMMUNITY): Payer: Self-pay

## 2020-04-30 ENCOUNTER — Ambulatory Visit (HOSPITAL_COMMUNITY)
Admission: RE | Admit: 2020-04-30 | Discharge: 2020-04-30 | Disposition: A | Discharge status: Home / Self Care | Payer: Medicare HMO | Source: Ambulatory Visit | Attending: Internal Medicine | Admitting: Internal Medicine

## 2020-04-30 ENCOUNTER — Other Ambulatory Visit: Payer: Self-pay

## 2020-04-30 DIAGNOSIS — M810 Age-related osteoporosis without current pathological fracture: Secondary | ICD-10-CM | POA: Diagnosis present

## 2020-04-30 MED ORDER — DENOSUMAB 60 MG/ML ~~LOC~~ SOSY
60.0000 mg | PREFILLED_SYRINGE | Freq: Once | SUBCUTANEOUS | Status: AC
  Administered 2020-04-30: 60 mg via SUBCUTANEOUS

## 2020-04-30 MED ORDER — DENOSUMAB 60 MG/ML ~~LOC~~ SOSY
PREFILLED_SYRINGE | SUBCUTANEOUS | Status: AC
  Filled 2020-04-30: qty 1

## 2020-06-02 DIAGNOSIS — E538 Deficiency of other specified B group vitamins: Secondary | ICD-10-CM | POA: Diagnosis not present

## 2020-09-11 DIAGNOSIS — E785 Hyperlipidemia, unspecified: Secondary | ICD-10-CM | POA: Diagnosis not present

## 2020-09-11 DIAGNOSIS — E538 Deficiency of other specified B group vitamins: Secondary | ICD-10-CM | POA: Diagnosis not present

## 2020-09-11 DIAGNOSIS — E039 Hypothyroidism, unspecified: Secondary | ICD-10-CM | POA: Diagnosis not present

## 2020-09-11 DIAGNOSIS — M810 Age-related osteoporosis without current pathological fracture: Secondary | ICD-10-CM | POA: Diagnosis not present

## 2020-09-18 ENCOUNTER — Other Ambulatory Visit: Payer: Self-pay | Admitting: Internal Medicine

## 2020-09-18 DIAGNOSIS — E538 Deficiency of other specified B group vitamins: Secondary | ICD-10-CM | POA: Diagnosis not present

## 2020-09-18 DIAGNOSIS — I712 Thoracic aortic aneurysm, without rupture: Secondary | ICD-10-CM | POA: Diagnosis not present

## 2020-09-18 DIAGNOSIS — I2510 Atherosclerotic heart disease of native coronary artery without angina pectoris: Secondary | ICD-10-CM | POA: Diagnosis not present

## 2020-09-18 DIAGNOSIS — Z0000 Encounter for general adult medical examination without abnormal findings: Secondary | ICD-10-CM | POA: Diagnosis not present

## 2020-09-18 DIAGNOSIS — I10 Essential (primary) hypertension: Secondary | ICD-10-CM | POA: Diagnosis not present

## 2020-09-18 DIAGNOSIS — Z1331 Encounter for screening for depression: Secondary | ICD-10-CM | POA: Diagnosis not present

## 2020-09-18 DIAGNOSIS — R82998 Other abnormal findings in urine: Secondary | ICD-10-CM | POA: Diagnosis not present

## 2020-09-18 DIAGNOSIS — I7121 Aneurysm of the ascending aorta, without rupture: Secondary | ICD-10-CM

## 2020-09-18 DIAGNOSIS — Z23 Encounter for immunization: Secondary | ICD-10-CM | POA: Diagnosis not present

## 2020-09-18 DIAGNOSIS — N3281 Overactive bladder: Secondary | ICD-10-CM | POA: Diagnosis not present

## 2020-09-18 DIAGNOSIS — E785 Hyperlipidemia, unspecified: Secondary | ICD-10-CM | POA: Diagnosis not present

## 2020-09-18 DIAGNOSIS — M810 Age-related osteoporosis without current pathological fracture: Secondary | ICD-10-CM | POA: Diagnosis not present

## 2020-09-28 ENCOUNTER — Encounter (HOSPITAL_COMMUNITY): Payer: No Typology Code available for payment source

## 2020-09-29 DIAGNOSIS — Z79899 Other long term (current) drug therapy: Secondary | ICD-10-CM | POA: Diagnosis not present

## 2020-09-29 DIAGNOSIS — E785 Hyperlipidemia, unspecified: Secondary | ICD-10-CM | POA: Diagnosis not present

## 2020-09-29 DIAGNOSIS — Z806 Family history of leukemia: Secondary | ICD-10-CM | POA: Diagnosis not present

## 2020-09-29 DIAGNOSIS — R69 Illness, unspecified: Secondary | ICD-10-CM | POA: Diagnosis not present

## 2020-09-29 DIAGNOSIS — R32 Unspecified urinary incontinence: Secondary | ICD-10-CM | POA: Diagnosis not present

## 2020-09-29 DIAGNOSIS — M810 Age-related osteoporosis without current pathological fracture: Secondary | ICD-10-CM | POA: Diagnosis not present

## 2020-09-29 DIAGNOSIS — G4700 Insomnia, unspecified: Secondary | ICD-10-CM | POA: Diagnosis not present

## 2020-09-29 DIAGNOSIS — I10 Essential (primary) hypertension: Secondary | ICD-10-CM | POA: Diagnosis not present

## 2020-09-29 DIAGNOSIS — Z882 Allergy status to sulfonamides status: Secondary | ICD-10-CM | POA: Diagnosis not present

## 2020-09-29 DIAGNOSIS — Z8261 Family history of arthritis: Secondary | ICD-10-CM | POA: Diagnosis not present

## 2020-09-29 DIAGNOSIS — Z604 Social exclusion and rejection: Secondary | ICD-10-CM | POA: Diagnosis not present

## 2020-09-29 DIAGNOSIS — G8929 Other chronic pain: Secondary | ICD-10-CM | POA: Diagnosis not present

## 2020-09-29 DIAGNOSIS — K5900 Constipation, unspecified: Secondary | ICD-10-CM | POA: Diagnosis not present

## 2020-10-05 ENCOUNTER — Encounter (HOSPITAL_COMMUNITY): Payer: No Typology Code available for payment source

## 2020-10-12 ENCOUNTER — Ambulatory Visit
Admission: RE | Admit: 2020-10-12 | Discharge: 2020-10-12 | Disposition: A | Discharge status: Home / Self Care | Payer: Medicare HMO | Source: Ambulatory Visit | Attending: Internal Medicine | Admitting: Internal Medicine

## 2020-10-12 ENCOUNTER — Other Ambulatory Visit: Payer: Self-pay

## 2020-10-12 DIAGNOSIS — I712 Thoracic aortic aneurysm, without rupture: Secondary | ICD-10-CM | POA: Diagnosis not present

## 2020-10-12 DIAGNOSIS — I7121 Aneurysm of the ascending aorta, without rupture: Secondary | ICD-10-CM

## 2020-10-12 DIAGNOSIS — I700 Atherosclerosis of aorta: Secondary | ICD-10-CM | POA: Diagnosis not present

## 2020-10-12 DIAGNOSIS — J479 Bronchiectasis, uncomplicated: Secondary | ICD-10-CM | POA: Diagnosis not present

## 2020-10-12 MED ORDER — IOPAMIDOL (ISOVUE-370) INJECTION 76%
75.0000 mL | Freq: Once | INTRAVENOUS | Status: AC | PRN
  Administered 2020-10-12: 75 mL via INTRAVENOUS

## 2020-10-13 ENCOUNTER — Other Ambulatory Visit (HOSPITAL_BASED_OUTPATIENT_CLINIC_OR_DEPARTMENT_OTHER): Payer: Self-pay

## 2020-10-13 ENCOUNTER — Ambulatory Visit: Payer: Medicare HMO | Attending: Internal Medicine

## 2020-10-13 DIAGNOSIS — Z23 Encounter for immunization: Secondary | ICD-10-CM

## 2020-10-13 MED ORDER — PFIZER COVID-19 VAC BIVALENT 30 MCG/0.3ML IM SUSP
INTRAMUSCULAR | 0 refills | Status: DC
  Filled 2020-10-13: qty 0.3, 1d supply, fill #0

## 2020-10-13 NOTE — Progress Notes (Signed)
   Covid-19 Vaccination Clinic  Name:  Melissa Tapia    MRN: [id number] DOB: Oct 27, 1940  10/13/2020  [name] was observed post Covid-19 immunization for 15 minutes without incident. She was provided with Vaccine Information Sheet and instruction to access the V-Safe system.   [name] was instructed to call 911 with any severe reactions post vaccine: Difficulty breathing  Swelling of face and throat  A fast heartbeat  A bad rash all over body  Dizziness and weakness

## 2020-10-14 DIAGNOSIS — N907 Vulvar cyst: Secondary | ICD-10-CM | POA: Diagnosis not present

## 2020-10-14 DIAGNOSIS — Z6826 Body mass index (BMI) 26.0-26.9, adult: Secondary | ICD-10-CM | POA: Diagnosis not present

## 2020-11-03 DIAGNOSIS — L738 Other specified follicular disorders: Secondary | ICD-10-CM | POA: Diagnosis not present

## 2020-11-03 DIAGNOSIS — D225 Melanocytic nevi of trunk: Secondary | ICD-10-CM | POA: Diagnosis not present

## 2020-11-03 DIAGNOSIS — I8391 Asymptomatic varicose veins of right lower extremity: Secondary | ICD-10-CM | POA: Diagnosis not present

## 2020-11-03 DIAGNOSIS — I8392 Asymptomatic varicose veins of left lower extremity: Secondary | ICD-10-CM | POA: Diagnosis not present

## 2020-11-03 DIAGNOSIS — L578 Other skin changes due to chronic exposure to nonionizing radiation: Secondary | ICD-10-CM | POA: Diagnosis not present

## 2020-11-03 DIAGNOSIS — L821 Other seborrheic keratosis: Secondary | ICD-10-CM | POA: Diagnosis not present

## 2020-11-05 DIAGNOSIS — Z1231 Encounter for screening mammogram for malignant neoplasm of breast: Secondary | ICD-10-CM | POA: Diagnosis not present

## 2020-11-11 ENCOUNTER — Other Ambulatory Visit (HOSPITAL_COMMUNITY): Payer: Self-pay | Admitting: *Deleted

## 2020-11-12 ENCOUNTER — Ambulatory Visit (HOSPITAL_COMMUNITY)
Admission: RE | Admit: 2020-11-12 | Discharge: 2020-11-12 | Disposition: A | Discharge status: Home / Self Care | Payer: Medicare HMO | Source: Ambulatory Visit | Attending: Internal Medicine | Admitting: Internal Medicine

## 2020-11-12 ENCOUNTER — Other Ambulatory Visit: Payer: Self-pay

## 2020-11-12 DIAGNOSIS — M810 Age-related osteoporosis without current pathological fracture: Secondary | ICD-10-CM | POA: Insufficient documentation

## 2020-11-12 MED ORDER — DENOSUMAB 60 MG/ML ~~LOC~~ SOSY
PREFILLED_SYRINGE | SUBCUTANEOUS | Status: AC
  Administered 2020-11-12: 60 mg via SUBCUTANEOUS
  Filled 2020-11-12: qty 1

## 2020-11-12 MED ORDER — DENOSUMAB 60 MG/ML ~~LOC~~ SOSY: 60.0000 mg | PREFILLED_SYRINGE | Freq: Once | SUBCUTANEOUS | Status: AC

## 2020-11-25 DIAGNOSIS — H5203 Hypermetropia, bilateral: Secondary | ICD-10-CM | POA: Diagnosis not present

## 2021-01-28 DIAGNOSIS — E538 Deficiency of other specified B group vitamins: Secondary | ICD-10-CM | POA: Diagnosis not present

## 2021-02-11 DIAGNOSIS — N952 Postmenopausal atrophic vaginitis: Secondary | ICD-10-CM | POA: Diagnosis not present

## 2021-02-11 DIAGNOSIS — R82998 Other abnormal findings in urine: Secondary | ICD-10-CM | POA: Diagnosis not present

## 2021-02-11 DIAGNOSIS — N3281 Overactive bladder: Secondary | ICD-10-CM | POA: Diagnosis not present

## 2021-02-11 DIAGNOSIS — R8290 Unspecified abnormal findings in urine: Secondary | ICD-10-CM | POA: Diagnosis not present

## 2021-02-11 DIAGNOSIS — N390 Urinary tract infection, site not specified: Secondary | ICD-10-CM | POA: Diagnosis not present

## 2021-02-11 DIAGNOSIS — N8111 Cystocele, midline: Secondary | ICD-10-CM | POA: Diagnosis not present

## 2021-03-03 DIAGNOSIS — M25562 Pain in left knee: Secondary | ICD-10-CM | POA: Diagnosis not present

## 2021-03-03 DIAGNOSIS — M1712 Unilateral primary osteoarthritis, left knee: Secondary | ICD-10-CM | POA: Diagnosis not present

## 2021-03-18 ENCOUNTER — Other Ambulatory Visit (HOSPITAL_COMMUNITY): Payer: Self-pay | Admitting: Internal Medicine

## 2021-03-18 DIAGNOSIS — I7121 Aneurysm of the ascending aorta, without rupture: Secondary | ICD-10-CM | POA: Diagnosis not present

## 2021-03-18 DIAGNOSIS — I2510 Atherosclerotic heart disease of native coronary artery without angina pectoris: Secondary | ICD-10-CM | POA: Diagnosis not present

## 2021-03-18 DIAGNOSIS — M810 Age-related osteoporosis without current pathological fracture: Secondary | ICD-10-CM | POA: Diagnosis not present

## 2021-03-18 DIAGNOSIS — E785 Hyperlipidemia, unspecified: Secondary | ICD-10-CM | POA: Diagnosis not present

## 2021-03-18 DIAGNOSIS — M7989 Other specified soft tissue disorders: Secondary | ICD-10-CM | POA: Diagnosis not present

## 2021-03-18 DIAGNOSIS — M5136 Other intervertebral disc degeneration, lumbar region: Secondary | ICD-10-CM | POA: Diagnosis not present

## 2021-03-18 DIAGNOSIS — I959 Hypotension, unspecified: Secondary | ICD-10-CM | POA: Diagnosis not present

## 2021-03-18 DIAGNOSIS — E538 Deficiency of other specified B group vitamins: Secondary | ICD-10-CM | POA: Diagnosis not present

## 2021-03-18 DIAGNOSIS — I10 Essential (primary) hypertension: Secondary | ICD-10-CM | POA: Diagnosis not present

## 2021-03-19 ENCOUNTER — Other Ambulatory Visit: Payer: Self-pay

## 2021-03-19 ENCOUNTER — Ambulatory Visit (HOSPITAL_COMMUNITY)
Admission: RE | Admit: 2021-03-19 | Discharge: 2021-03-19 | Disposition: A | Discharge status: Home / Self Care | Payer: Medicare HMO | Source: Ambulatory Visit | Attending: Internal Medicine | Admitting: Internal Medicine

## 2021-03-19 DIAGNOSIS — M7989 Other specified soft tissue disorders: Secondary | ICD-10-CM | POA: Diagnosis not present

## 2021-04-05 DIAGNOSIS — I868 Varicose veins of other specified sites: Secondary | ICD-10-CM | POA: Diagnosis not present

## 2021-04-05 DIAGNOSIS — I2510 Atherosclerotic heart disease of native coronary artery without angina pectoris: Secondary | ICD-10-CM | POA: Diagnosis not present

## 2021-04-05 DIAGNOSIS — I82813 Embolism and thrombosis of superficial veins of lower extremities, bilateral: Secondary | ICD-10-CM | POA: Diagnosis not present

## 2021-04-05 DIAGNOSIS — M7122 Synovial cyst of popliteal space [Baker], left knee: Secondary | ICD-10-CM | POA: Diagnosis not present

## 2021-04-05 DIAGNOSIS — E785 Hyperlipidemia, unspecified: Secondary | ICD-10-CM | POA: Diagnosis not present

## 2021-04-05 DIAGNOSIS — I959 Hypotension, unspecified: Secondary | ICD-10-CM | POA: Diagnosis not present

## 2021-04-22 ENCOUNTER — Other Ambulatory Visit (HOSPITAL_COMMUNITY): Payer: Self-pay

## 2021-04-23 ENCOUNTER — Ambulatory Visit (HOSPITAL_COMMUNITY)
Admission: RE | Admit: 2021-04-23 | Discharge: 2021-04-23 | Disposition: A | Discharge status: Home / Self Care | Payer: Medicare HMO | Source: Ambulatory Visit | Attending: Internal Medicine | Admitting: Internal Medicine

## 2021-04-23 DIAGNOSIS — M810 Age-related osteoporosis without current pathological fracture: Secondary | ICD-10-CM | POA: Insufficient documentation

## 2021-04-23 MED ORDER — DENOSUMAB 60 MG/ML ~~LOC~~ SOSY
60.0000 mg | PREFILLED_SYRINGE | Freq: Once | SUBCUTANEOUS | Status: AC
  Administered 2021-04-23: 60 mg via SUBCUTANEOUS

## 2021-04-23 MED ORDER — DENOSUMAB 60 MG/ML ~~LOC~~ SOSY
PREFILLED_SYRINGE | SUBCUTANEOUS | Status: AC
  Filled 2021-04-23: qty 1

## 2021-04-26 ENCOUNTER — Encounter (HOSPITAL_COMMUNITY): Payer: Medicare HMO

## 2021-05-19 ENCOUNTER — Encounter (HOSPITAL_COMMUNITY): Payer: Medicare HMO

## 2021-05-19 ENCOUNTER — Ambulatory Visit (HOSPITAL_COMMUNITY)
Admission: RE | Admit: 2021-05-19 | Discharge: 2021-05-19 | Disposition: A | Discharge status: Home / Self Care | Payer: Medicare HMO | Source: Ambulatory Visit | Attending: Internal Medicine | Admitting: Internal Medicine

## 2021-05-19 ENCOUNTER — Other Ambulatory Visit (HOSPITAL_COMMUNITY): Payer: Self-pay | Admitting: Internal Medicine

## 2021-05-19 DIAGNOSIS — I8000 Phlebitis and thrombophlebitis of superficial vessels of unspecified lower extremity: Secondary | ICD-10-CM | POA: Diagnosis not present

## 2021-05-27 DIAGNOSIS — M170 Bilateral primary osteoarthritis of knee: Secondary | ICD-10-CM | POA: Diagnosis not present

## 2021-06-07 DIAGNOSIS — M7122 Synovial cyst of popliteal space [Baker], left knee: Secondary | ICD-10-CM | POA: Diagnosis not present

## 2021-06-07 DIAGNOSIS — M7121 Synovial cyst of popliteal space [Baker], right knee: Secondary | ICD-10-CM | POA: Diagnosis not present

## 2021-06-07 DIAGNOSIS — M25562 Pain in left knee: Secondary | ICD-10-CM | POA: Diagnosis not present

## 2021-06-07 DIAGNOSIS — M25561 Pain in right knee: Secondary | ICD-10-CM | POA: Diagnosis not present

## 2021-06-09 DIAGNOSIS — E538 Deficiency of other specified B group vitamins: Secondary | ICD-10-CM | POA: Diagnosis not present

## 2021-06-25 DIAGNOSIS — M545 Low back pain, unspecified: Secondary | ICD-10-CM | POA: Diagnosis not present

## 2021-06-25 DIAGNOSIS — M25562 Pain in left knee: Secondary | ICD-10-CM | POA: Diagnosis not present

## 2021-06-25 DIAGNOSIS — M25561 Pain in right knee: Secondary | ICD-10-CM | POA: Diagnosis not present

## 2021-06-28 DIAGNOSIS — R238 Other skin changes: Secondary | ICD-10-CM | POA: Diagnosis not present

## 2021-08-14 ENCOUNTER — Emergency Department (HOSPITAL_BASED_OUTPATIENT_CLINIC_OR_DEPARTMENT_OTHER)
Admission: EM | Admit: 2021-08-14 | Discharge: 2021-08-14 | Disposition: A | Discharge status: Home / Self Care | Payer: Medicare HMO | Attending: Emergency Medicine | Admitting: Emergency Medicine

## 2021-08-14 ENCOUNTER — Other Ambulatory Visit: Payer: Self-pay

## 2021-08-14 ENCOUNTER — Encounter (HOSPITAL_BASED_OUTPATIENT_CLINIC_OR_DEPARTMENT_OTHER): Payer: Self-pay | Admitting: Emergency Medicine

## 2021-08-14 DIAGNOSIS — R110 Nausea: Secondary | ICD-10-CM

## 2021-08-14 DIAGNOSIS — R638 Other symptoms and signs concerning food and fluid intake: Secondary | ICD-10-CM | POA: Insufficient documentation

## 2021-08-14 HISTORY — DX: Essential (primary) hypertension: I10

## 2021-08-14 HISTORY — DX: Pure hypercholesterolemia, unspecified: E78.00

## 2021-08-14 LAB — CBC WITH DIFFERENTIAL/PLATELET
Abs Immature Granulocytes: 0.01 10*3/uL (ref 0.00–0.07)
Basophils Absolute: 0 10*3/uL (ref 0.0–0.1)
Basophils Relative: 1 %
Eosinophils Absolute: 0.3 10*3/uL (ref 0.0–0.5)
Eosinophils Relative: 4 %
HCT: 40.3 % (ref 36.0–46.0)
Hemoglobin: 13.9 g/dL (ref 12.0–15.0)
Immature Granulocytes: 0 %
Lymphocytes Relative: 31 %
Lymphs Abs: 2 10*3/uL (ref 0.7–4.0)
MCH: 34.1 pg — ABNORMAL HIGH (ref 26.0–34.0)
MCHC: 34.5 g/dL (ref 30.0–36.0)
MCV: 98.8 fL (ref 80.0–100.0)
Monocytes Absolute: 0.4 10*3/uL (ref 0.1–1.0)
Monocytes Relative: 6 %
Neutro Abs: 3.7 10*3/uL (ref 1.7–7.7)
Neutrophils Relative %: 58 %
Platelets: 219 10*3/uL (ref 150–400)
RBC: 4.08 MIL/uL (ref 3.87–5.11)
RDW: 12.3 % (ref 11.5–15.5)
WBC: 6.4 10*3/uL (ref 4.0–10.5)
nRBC: 0 % (ref 0.0–0.2)

## 2021-08-14 LAB — URINALYSIS, ROUTINE W REFLEX MICROSCOPIC
Bilirubin Urine: NEGATIVE
Glucose, UA: NEGATIVE mg/dL
Hgb urine dipstick: NEGATIVE
Ketones, ur: NEGATIVE mg/dL
Nitrite: NEGATIVE
Protein, ur: NEGATIVE mg/dL
Specific Gravity, Urine: 1.005 (ref 1.005–1.030)
pH: 6.5 (ref 5.0–8.0)

## 2021-08-14 LAB — COMPREHENSIVE METABOLIC PANEL
ALT: 20 U/L (ref 0–44)
AST: 25 U/L (ref 15–41)
Albumin: 4.4 g/dL (ref 3.5–5.0)
Alkaline Phosphatase: 58 U/L (ref 38–126)
Anion gap: 10 (ref 5–15)
BUN: 14 mg/dL (ref 8–23)
CO2: 26 mmol/L (ref 22–32)
Calcium: 10 mg/dL (ref 8.9–10.3)
Chloride: 107 mmol/L (ref 98–111)
Creatinine, Ser: 0.93 mg/dL (ref 0.44–1.00)
GFR, Estimated: >60 mL/min (ref >60)
Glucose, Bld: 118 mg/dL — ABNORMAL HIGH (ref 70–99)
Potassium: 3.6 mmol/L (ref 3.5–5.1)
Sodium: 143 mmol/L (ref 135–145)
Total Bilirubin: 1.5 mg/dL — ABNORMAL HIGH (ref 0.3–1.2)
Total Protein: 6.7 g/dL (ref 6.5–8.1)

## 2021-08-14 LAB — LIPASE, BLOOD: Lipase: 48 U/L (ref 11–51)

## 2021-08-14 MED ORDER — ESOMEPRAZOLE MAGNESIUM 20 MG PO CPDR: 20.0000 mg | DELAYED_RELEASE_CAPSULE | Freq: Every day | ORAL | 0 refills | Status: DC

## 2021-08-14 NOTE — ED Notes (Signed)
Pt attempted to void, with no success. Given cup of water.

## 2021-08-14 NOTE — Discharge Instructions (Signed)
Recommend trying the antacid medicine as newly prescribed.  Please follow-up with your primary care doctor and your gastroenterologist.  Come back to ER if you are having worsening pain, vomiting, fever or other new concerning symptom.

## 2021-08-14 NOTE — ED Provider Notes (Signed)
[facility] EMERGENC[facility] DEPT Provider Note   CSN: [id number] Arrival date & time: 08/14/21  3149     History  Chief Complaint  Patient presents with   Nausea    Melissa Tapia is a 81 y.o. female.  Presented to the emergency room due to concern for nausea.  Patient states that since Monday she has been having intermittent episodes of nausea, decreased appetite.  Last night she woke up due to some upper abdominal discomfort.  She states this morning she was not have any sort of ongoing abdominal pain, does not have any ongoing symptoms at this time.  Has not vomited.  No chills or fevers.  Planning to go to the beach today with family and wanted to get evaluated prior to making the trip.  She denies prior history of gastric reflux disease, gastritis, esophagitis.  Has never had endoscopy.  Had colonoscopy in 2021 that was reportedly grossly normal.  Patient had a copy of her colonoscopy report from 2021, diverticulosis, hemorrhoids but no other concerning findings.  Performed by Dr. Watt Climes.  HPI     Home Medications Prior to Admission medications   Medication Sig Start Date End Date Taking? Authorizing Provider  esomeprazole (NEXIUM) 20 MG capsule Take 1 capsule (20 mg total) by mouth daily. 08/14/21  Yes Jayma Volpi, Ellwood Dense, MD  COVID-19 mRNA bivalent vaccine, Pfizer, (PFIZER COVID-19 VAC BIVALENT) injection Inject into the muscle. 10/13/20   Carlyle Basques, MD      Allergies    Sulfa antibiotics    Review of Systems   Review of Systems  Constitutional:  Positive for fatigue. Negative for chills and fever.  HENT:  Negative for ear pain and sore throat.   Eyes:  Negative for pain and visual disturbance.  Respiratory:  Negative for cough and shortness of breath.   Cardiovascular:  Negative for chest pain and palpitations.  Gastrointestinal:  Positive for nausea. Negative for abdominal pain and vomiting.  Genitourinary:  Negative for dysuria and hematuria.   Musculoskeletal:  Negative for arthralgias and back pain.  Skin:  Negative for color change and rash.  Neurological:  Negative for seizures and syncope.  All other systems reviewed and are negative.   Physical Exam Updated Vital Signs BP (!) 169/70   Pulse 63   Temp 98.4 F (36.9 C)   Resp 16   Ht '5\' 3"'$  (1.6 m)   Wt 68 kg   SpO2 100%   BMI 26.57 kg/m  Physical Exam Vitals and nursing note reviewed.  Constitutional:      General: She is not in acute distress.    Appearance: She is well-developed.  HENT:     Head: Normocephalic and atraumatic.  Eyes:     Conjunctiva/sclera: Conjunctivae normal.  Cardiovascular:     Rate and Rhythm: Normal rate and regular rhythm.     Heart sounds: No murmur heard. Pulmonary:     Effort: Pulmonary effort is normal. No respiratory distress.     Breath sounds: Normal breath sounds.  Abdominal:     Palpations: Abdomen is soft.     Tenderness: There is no abdominal tenderness.  Musculoskeletal:        General: No swelling.     Cervical back: Neck supple.  Skin:    General: Skin is warm and dry.     Capillary Refill: Capillary refill takes less than 2 seconds.  Neurological:     Mental Status: She is alert.  Psychiatric:  Mood and Affect: Mood normal.     ED Results / Procedures / Treatments   Labs (all labs ordered are listed, but only abnormal results are displayed) Labs Reviewed  CBC WITH DIFFERENTIAL/PLATELET - Abnormal; Notable for the following components:      Result Value   MCH 34.1 (*)    All other components within normal limits  COMPREHENSIVE METABOLIC PANEL - Abnormal; Notable for the following components:   Glucose, Bld 118 (*)    Total Bilirubin 1.5 (*)    All other components within normal limits  URINALYSIS, ROUTINE W REFLEX MICROSCOPIC - Abnormal; Notable for the following components:   Leukocytes,Ua SMALL (*)    All other components within normal limits  LIPASE, BLOOD    EKG EKG  Interpretation  Date/Time:  Saturday August 14 2021 08:07:30 EDT Ventricular Rate:  81 PR Interval:  164 QRS Duration: 92 QT Interval:  392 QTC Calculation: 455 R Axis:   -47 Text Interpretation: Sinus rhythm Left anterior fascicular block Abnormal R-wave progression, late transition Borderline T abnormalities, anterior leads Confirmed by Madalyn Rob [phone]) on 08/14/2021 8:25:46 AM  Radiology No results found.  Procedures Procedures    Medications Ordered in ED Medications - No data to display  ED Course/ Medical Decision Making/ A&P                           Medical Decision Making Amount and/or Complexity of Data Reviewed Labs: ordered.   81 year old lady presenting to ER due to concern for intermittent nausea, poor appetite over the past week.  On exam she appears well in no acute distress, her abdomen is soft and nontender.  Checked urinalysis, basic labs.  No leukocytosis, no UTI, normal LFTs, no electrolyte derangement.  Given work-up, no ongoing symptoms, feel she stable for discharge and can be managed in outpatient setting, do not feel she needs CT scan given her exam, history and blood work.  Advise follow-up with her primary care doctor and/or gastroenterology.  Advised trial of PPI for now.     After the discussed management above, the patient was determined to be safe for discharge.  The patient was in agreement with this plan and all questions regarding their care were answered.  ED return precautions were discussed and the patient will return to the ED with any significant worsening of condition.         Final Clinical Impression(s) / ED Diagnoses Final diagnoses:  Nausea    Rx / DC Orders ED Discharge Orders          Ordered    esomeprazole (NEXIUM) 20 MG capsule  Daily        08/14/21 1105              Lucrezia Starch, MD 08/14/21 1105

## 2021-08-14 NOTE — ED Triage Notes (Signed)
Pt reports nausea and decreased appetite since Monday. No abd pain/fevers/vomiting or diarrhea.

## 2021-08-24 DIAGNOSIS — R32 Unspecified urinary incontinence: Secondary | ICD-10-CM | POA: Diagnosis not present

## 2021-08-24 DIAGNOSIS — Z008 Encounter for other general examination: Secondary | ICD-10-CM | POA: Diagnosis not present

## 2021-08-24 DIAGNOSIS — Z809 Family history of malignant neoplasm, unspecified: Secondary | ICD-10-CM | POA: Diagnosis not present

## 2021-08-24 DIAGNOSIS — M810 Age-related osteoporosis without current pathological fracture: Secondary | ICD-10-CM | POA: Diagnosis not present

## 2021-08-24 DIAGNOSIS — Z882 Allergy status to sulfonamides status: Secondary | ICD-10-CM | POA: Diagnosis not present

## 2021-08-24 DIAGNOSIS — K219 Gastro-esophageal reflux disease without esophagitis: Secondary | ICD-10-CM | POA: Diagnosis not present

## 2021-08-24 DIAGNOSIS — M1990 Unspecified osteoarthritis, unspecified site: Secondary | ICD-10-CM | POA: Diagnosis not present

## 2021-08-24 DIAGNOSIS — N905 Atrophy of vulva: Secondary | ICD-10-CM | POA: Diagnosis not present

## 2021-08-24 DIAGNOSIS — R69 Illness, unspecified: Secondary | ICD-10-CM | POA: Diagnosis not present

## 2021-08-24 DIAGNOSIS — Z79620 Long term (current) use of immunosuppressive biologic: Secondary | ICD-10-CM | POA: Diagnosis not present

## 2021-08-24 DIAGNOSIS — E785 Hyperlipidemia, unspecified: Secondary | ICD-10-CM | POA: Diagnosis not present

## 2021-08-24 DIAGNOSIS — Z7982 Long term (current) use of aspirin: Secondary | ICD-10-CM | POA: Diagnosis not present

## 2021-08-24 DIAGNOSIS — I10 Essential (primary) hypertension: Secondary | ICD-10-CM | POA: Diagnosis not present

## 2021-09-02 DIAGNOSIS — I82813 Embolism and thrombosis of superficial veins of lower extremities, bilateral: Secondary | ICD-10-CM | POA: Diagnosis not present

## 2021-09-02 DIAGNOSIS — E538 Deficiency of other specified B group vitamins: Secondary | ICD-10-CM | POA: Diagnosis not present

## 2021-09-02 DIAGNOSIS — I2510 Atherosclerotic heart disease of native coronary artery without angina pectoris: Secondary | ICD-10-CM | POA: Diagnosis not present

## 2021-09-02 DIAGNOSIS — E785 Hyperlipidemia, unspecified: Secondary | ICD-10-CM | POA: Diagnosis not present

## 2021-09-02 DIAGNOSIS — M810 Age-related osteoporosis without current pathological fracture: Secondary | ICD-10-CM | POA: Diagnosis not present

## 2021-09-02 DIAGNOSIS — E039 Hypothyroidism, unspecified: Secondary | ICD-10-CM | POA: Diagnosis not present

## 2021-09-02 DIAGNOSIS — R110 Nausea: Secondary | ICD-10-CM | POA: Diagnosis not present

## 2021-09-02 DIAGNOSIS — I10 Essential (primary) hypertension: Secondary | ICD-10-CM | POA: Diagnosis not present

## 2021-09-06 DIAGNOSIS — M810 Age-related osteoporosis without current pathological fracture: Secondary | ICD-10-CM | POA: Diagnosis not present

## 2021-09-09 DIAGNOSIS — K5900 Constipation, unspecified: Secondary | ICD-10-CM | POA: Diagnosis not present

## 2021-09-15 ENCOUNTER — Ambulatory Visit
Admission: RE | Admit: 2021-09-15 | Discharge: 2021-09-15 | Disposition: A | Discharge status: Home / Self Care | Payer: Medicare HMO | Source: Ambulatory Visit | Attending: Gastroenterology | Admitting: Gastroenterology

## 2021-09-15 ENCOUNTER — Other Ambulatory Visit: Payer: Self-pay | Admitting: Gastroenterology

## 2021-09-15 DIAGNOSIS — K5900 Constipation, unspecified: Secondary | ICD-10-CM | POA: Diagnosis not present

## 2021-09-17 DIAGNOSIS — R110 Nausea: Secondary | ICD-10-CM | POA: Diagnosis not present

## 2021-09-21 ENCOUNTER — Encounter (HOSPITAL_BASED_OUTPATIENT_CLINIC_OR_DEPARTMENT_OTHER): Payer: Self-pay | Admitting: Obstetrics and Gynecology

## 2021-09-21 ENCOUNTER — Other Ambulatory Visit: Payer: Self-pay

## 2021-09-21 ENCOUNTER — Emergency Department (HOSPITAL_BASED_OUTPATIENT_CLINIC_OR_DEPARTMENT_OTHER)
Admission: EM | Admit: 2021-09-21 | Discharge: 2021-09-21 | Disposition: A | Discharge status: Home / Self Care | Payer: Medicare HMO | Attending: Emergency Medicine | Admitting: Emergency Medicine

## 2021-09-21 DIAGNOSIS — Z79899 Other long term (current) drug therapy: Secondary | ICD-10-CM | POA: Diagnosis not present

## 2021-09-21 DIAGNOSIS — I10 Essential (primary) hypertension: Secondary | ICD-10-CM | POA: Insufficient documentation

## 2021-09-21 DIAGNOSIS — R112 Nausea with vomiting, unspecified: Secondary | ICD-10-CM | POA: Diagnosis not present

## 2021-09-21 DIAGNOSIS — R110 Nausea: Secondary | ICD-10-CM

## 2021-09-21 MED ORDER — ONDANSETRON HCL 4 MG PO TABS: 4.0000 mg | ORAL_TABLET | Freq: Three times a day (TID) | ORAL | 0 refills | Status: DC | PRN

## 2021-09-21 MED ORDER — ONDANSETRON 4 MG PO TBDP
8.0000 mg | ORAL_TABLET | Freq: Once | ORAL | Status: AC
  Administered 2021-09-21: 8 mg via ORAL
  Filled 2021-09-21: qty 2

## 2021-09-21 MED ORDER — ALUM & MAG HYDROXIDE-SIMETH 200-200-20 MG/5ML PO SUSP
30.0000 mL | Freq: Once | ORAL | Status: AC
  Administered 2021-09-21: 30 mL via ORAL
  Filled 2021-09-21: qty 30

## 2021-09-21 MED ORDER — ESOMEPRAZOLE MAGNESIUM 40 MG PO CPDR: 40.0000 mg | DELAYED_RELEASE_CAPSULE | Freq: Every day | ORAL | 0 refills | Status: DC

## 2021-09-21 MED ORDER — SUCRALFATE 1 G PO TABS: 1.0000 g | ORAL_TABLET | Freq: Three times a day (TID) | ORAL | 0 refills | Status: DC

## 2021-09-21 NOTE — ED Triage Notes (Signed)
Patient reports to the ER for nausea. Reports she was placed on Aspirin 325 by her doctor and has since had nausea. Reports she was seen in the ER and prescribed a PPI. Patient followed up with her PCP and was told to keep taking the aspirin. Patient reports August 24th she went to see [facility] and had no blood in her stool and an X-ray was done. Patient reports she was seen on 9/1 by [facility] and was told no concerns on X-rays. Patient reports the last 4 nights she has not been able to sleep due to nausea. Patient reports she has been taking a stool softener and miralax to assist in BM. Patient reports she is concerned about her nausea continuing and the PPI is no longer helping.

## 2021-09-21 NOTE — Discharge Instructions (Addendum)
Please return to the ED with any new symptoms such as fevers, blood in vomit Please read attached guide concerning nausea Please increase Nexium dosage to 40 mg.  I have written your prescription insulin at your pharmacy reflecting this.  Please also begin taking Carafate 1 g 3 times daily with meals.  This prescription is also been sent in.  Please take Zofran as needed for breakthrough nausea. Please follow-up with your PCP for further management

## 2021-09-21 NOTE — ED Provider Notes (Signed)
[facility] EMERGENCY DEPT Provider Note   CSN: [id number] Arrival date & time: 09/21/21  1029     History  Chief Complaint  Patient presents with   Nausea    Melissa Tapia is a 81 y.o. female with medical history of hypertension.  The patient presents to the ED for evaluation of nausea.  Patient states that back in June she was started on 325 aspirin once daily by her PCP due to left lower extremity swelling.  The patient states that ever since starting aspirin she has had persistent nausea.  The patient was seen on 7/29 in the ED and placed on 20 mg of Nexium which she states has been controlling her nausea quite well since then.  The patient states that in the last 5 to 6 days her nausea has returned.  The patient reports that she is still taking 20 mg of Nexium as directed, patient denies taking Zofran.  The patient states that she was recently seen at [facility], had unremarkable abdominal films and unremarkable stool sample which did not show any blood in her stool.  Patient denies any abdominal pain, fevers, constipation, diarrhea, vomiting, blood in stool.  HPI     Home Medications Prior to Admission medications   Medication Sig Start Date End Date Taking? Authorizing Provider  esomeprazole (NEXIUM) 40 MG capsule Take 1 capsule (40 mg total) by mouth daily. 09/21/21  Yes Azucena Cecil, PA-C  ondansetron (ZOFRAN) 4 MG tablet Take 1 tablet (4 mg total) by mouth every 8 (eight) hours as needed for nausea or vomiting. 09/21/21  Yes Azucena Cecil, PA-C  sucralfate (CARAFATE) 1 g tablet Take 1 tablet (1 g total) by mouth 4 (four) times daily -  with meals and at bedtime. 09/21/21  Yes Genevive Bi F, PA-C  COVID-19 mRNA bivalent vaccine, Pfizer, (PFIZER COVID-19 VAC BIVALENT) injection Inject into the muscle. 10/13/20   Carlyle Basques, MD      Allergies    Sulfa antibiotics    Review of Systems   Review of Systems  Constitutional:  Negative for fever.   Gastrointestinal:  Positive for nausea. Negative for abdominal pain, blood in stool, constipation, diarrhea and vomiting.  All other systems reviewed and are negative.   Physical Exam Updated Vital Signs BP 137/66   Pulse 63   Temp 98.6 F (37 C)   Resp 18   Ht '5\' 3"'$  (1.6 m)   Wt 68 kg   SpO2 100%   BMI 26.56 kg/m  Physical Exam Vitals and nursing note reviewed.  Constitutional:      General: She is not in acute distress.    Appearance: Normal appearance. She is not ill-appearing, toxic-appearing or diaphoretic.  HENT:     Head: Normocephalic and atraumatic.     Nose: Nose normal. No congestion.     Mouth/Throat:     Mouth: Mucous membranes are moist.     Pharynx: Oropharynx is clear.  Eyes:     Extraocular Movements: Extraocular movements intact.     Conjunctiva/sclera: Conjunctivae normal.     Pupils: Pupils are equal, round, and reactive to light.  Cardiovascular:     Rate and Rhythm: Normal rate and regular rhythm.  Pulmonary:     Effort: Pulmonary effort is normal.     Breath sounds: Normal breath sounds. No wheezing.  Abdominal:     General: Abdomen is flat. Bowel sounds are normal.     Palpations: Abdomen is soft.  Tenderness: There is no abdominal tenderness. There is no right CVA tenderness, left CVA tenderness or guarding.  Musculoskeletal:     Cervical back: Normal range of motion and neck supple. No tenderness.  Skin:    General: Skin is warm and dry.     Capillary Refill: Capillary refill takes less than 2 seconds.  Neurological:     Mental Status: She is alert and oriented to person, place, and time.     ED Results / Procedures / Treatments   Labs (all labs ordered are listed, but only abnormal results are displayed) Labs Reviewed - No data to display  EKG None  Radiology No results found.  Procedures Procedures   Medications Ordered in ED Medications  ondansetron (ZOFRAN-ODT) disintegrating tablet 8 mg (8 mg Oral Given 09/21/21 1155)   alum & mag hydroxide-simeth (MAALOX/MYLANTA) 200-200-20 MG/5ML suspension 30 mL (30 mLs Oral Given 09/21/21 1159)    ED Course/ Medical Decision Making/ A&P                           Medical Decision Making Risk OTC drugs. Prescription drug management.   81 year old female presents to ED for evaluation.  Please see HPI for further details.  On examination the patient is afebrile and nontachycardic.  The patient lung sounds are clear bilaterally, she is not hypoxic.  Patient abdomen is soft and compressible in all 4 quadrants, there is no tenderness elicited.  The patient has no CVA tenderness bilaterally.  The patient is nontoxic in appearance.  Patient states that original dose of 20 mg Nexium treated symptoms quite well.  After the patient was given GI cocktail, Zofran here in the department she states that her symptoms abated and she felt much better.  The patient will be advised to increase her Nexium dosage to 40 mg and she will also be sent home with as needed Carafate and Zofran.  Patient advised to follow-up with PCP to discuss lower dose of aspirin, she states that she will do this.  Patient given return precautions and she voiced understanding.  The patient had all her questions answered to her satisfaction prior to discharge.  The patient is stable at this time for discharge home.  Final Clinical Impression(s) / ED Diagnoses Final diagnoses:  Nausea without vomiting    Rx / DC Orders ED Discharge Orders          Ordered    esomeprazole (NEXIUM) 40 MG capsule  Daily        09/21/21 1246    sucralfate (CARAFATE) 1 g tablet  3 times daily with meals & bedtime        09/21/21 1246    ondansetron (ZOFRAN) 4 MG tablet  Every 8 hours PRN        09/21/21 1246              Azucena Cecil, PA-C 09/21/21 1247    Blanchie Dessert, MD 09/22/21 1128

## 2021-09-24 ENCOUNTER — Other Ambulatory Visit: Payer: Self-pay | Admitting: Internal Medicine

## 2021-09-24 ENCOUNTER — Ambulatory Visit
Admission: RE | Admit: 2021-09-24 | Discharge: 2021-09-24 | Disposition: A | Discharge status: Home / Self Care | Payer: Medicare HMO | Source: Ambulatory Visit | Attending: Internal Medicine | Admitting: Internal Medicine

## 2021-09-24 DIAGNOSIS — K8020 Calculus of gallbladder without cholecystitis without obstruction: Secondary | ICD-10-CM | POA: Diagnosis not present

## 2021-09-24 DIAGNOSIS — E785 Hyperlipidemia, unspecified: Secondary | ICD-10-CM | POA: Diagnosis not present

## 2021-09-24 DIAGNOSIS — M5136 Other intervertebral disc degeneration, lumbar region: Secondary | ICD-10-CM | POA: Diagnosis not present

## 2021-09-24 DIAGNOSIS — E039 Hypothyroidism, unspecified: Secondary | ICD-10-CM | POA: Diagnosis not present

## 2021-09-24 DIAGNOSIS — M810 Age-related osteoporosis without current pathological fracture: Secondary | ICD-10-CM | POA: Diagnosis not present

## 2021-09-24 DIAGNOSIS — I2510 Atherosclerotic heart disease of native coronary artery without angina pectoris: Secondary | ICD-10-CM | POA: Diagnosis not present

## 2021-09-24 DIAGNOSIS — R0609 Other forms of dyspnea: Secondary | ICD-10-CM

## 2021-09-24 DIAGNOSIS — R110 Nausea: Secondary | ICD-10-CM | POA: Diagnosis not present

## 2021-09-24 DIAGNOSIS — I10 Essential (primary) hypertension: Secondary | ICD-10-CM | POA: Diagnosis not present

## 2021-09-24 DIAGNOSIS — J984 Other disorders of lung: Secondary | ICD-10-CM | POA: Diagnosis not present

## 2021-09-24 DIAGNOSIS — E538 Deficiency of other specified B group vitamins: Secondary | ICD-10-CM | POA: Diagnosis not present

## 2021-09-24 DIAGNOSIS — I700 Atherosclerosis of aorta: Secondary | ICD-10-CM | POA: Diagnosis not present

## 2021-09-24 DIAGNOSIS — I82813 Embolism and thrombosis of superficial veins of lower extremities, bilateral: Secondary | ICD-10-CM | POA: Diagnosis not present

## 2021-09-24 DIAGNOSIS — J929 Pleural plaque without asbestos: Secondary | ICD-10-CM | POA: Diagnosis not present

## 2021-09-24 MED ORDER — IOPAMIDOL (ISOVUE-370) INJECTION 76%
75.0000 mL | Freq: Once | INTRAVENOUS | Status: AC | PRN
  Administered 2021-09-24: 75 mL via INTRAVENOUS

## 2021-09-30 ENCOUNTER — Ambulatory Visit
Admission: RE | Admit: 2021-09-30 | Discharge: 2021-09-30 | Disposition: A | Discharge status: Home / Self Care | Payer: Medicare HMO | Source: Ambulatory Visit | Attending: Internal Medicine | Admitting: Internal Medicine

## 2021-09-30 ENCOUNTER — Other Ambulatory Visit: Payer: Self-pay | Admitting: Internal Medicine

## 2021-09-30 DIAGNOSIS — J8410 Pulmonary fibrosis, unspecified: Secondary | ICD-10-CM

## 2021-09-30 DIAGNOSIS — R0609 Other forms of dyspnea: Secondary | ICD-10-CM | POA: Diagnosis not present

## 2021-09-30 DIAGNOSIS — I700 Atherosclerosis of aorta: Secondary | ICD-10-CM | POA: Diagnosis not present

## 2021-09-30 DIAGNOSIS — J479 Bronchiectasis, uncomplicated: Secondary | ICD-10-CM | POA: Diagnosis not present

## 2021-09-30 DIAGNOSIS — J84112 Idiopathic pulmonary fibrosis: Secondary | ICD-10-CM | POA: Diagnosis not present

## 2021-09-30 DIAGNOSIS — K449 Diaphragmatic hernia without obstruction or gangrene: Secondary | ICD-10-CM | POA: Diagnosis not present

## 2021-09-30 DIAGNOSIS — K219 Gastro-esophageal reflux disease without esophagitis: Secondary | ICD-10-CM | POA: Diagnosis not present

## 2021-10-06 DIAGNOSIS — I10 Essential (primary) hypertension: Secondary | ICD-10-CM | POA: Diagnosis not present

## 2021-10-06 DIAGNOSIS — E785 Hyperlipidemia, unspecified: Secondary | ICD-10-CM | POA: Diagnosis not present

## 2021-10-06 DIAGNOSIS — M810 Age-related osteoporosis without current pathological fracture: Secondary | ICD-10-CM | POA: Diagnosis not present

## 2021-10-06 DIAGNOSIS — J8410 Pulmonary fibrosis, unspecified: Secondary | ICD-10-CM | POA: Diagnosis not present

## 2021-10-06 DIAGNOSIS — R0609 Other forms of dyspnea: Secondary | ICD-10-CM | POA: Diagnosis not present

## 2021-10-06 DIAGNOSIS — E039 Hypothyroidism, unspecified: Secondary | ICD-10-CM | POA: Diagnosis not present

## 2021-10-06 DIAGNOSIS — K219 Gastro-esophageal reflux disease without esophagitis: Secondary | ICD-10-CM | POA: Diagnosis not present

## 2021-10-13 DIAGNOSIS — R202 Paresthesia of skin: Secondary | ICD-10-CM | POA: Diagnosis not present

## 2021-10-13 DIAGNOSIS — I10 Essential (primary) hypertension: Secondary | ICD-10-CM | POA: Diagnosis not present

## 2021-10-13 DIAGNOSIS — R200 Anesthesia of skin: Secondary | ICD-10-CM | POA: Diagnosis not present

## 2021-10-13 DIAGNOSIS — J8410 Pulmonary fibrosis, unspecified: Secondary | ICD-10-CM | POA: Diagnosis not present

## 2021-10-13 DIAGNOSIS — R0609 Other forms of dyspnea: Secondary | ICD-10-CM | POA: Diagnosis not present

## 2021-10-13 DIAGNOSIS — R69 Illness, unspecified: Secondary | ICD-10-CM | POA: Diagnosis not present

## 2021-10-21 ENCOUNTER — Ambulatory Visit (INDEPENDENT_AMBULATORY_CARE_PROVIDER_SITE_OTHER): Payer: Medicare HMO | Admitting: Internal Medicine

## 2021-10-21 ENCOUNTER — Encounter: Payer: Self-pay | Admitting: Internal Medicine

## 2021-10-21 VITALS — BP 130/76 | HR 85 | Temp 97.9°F | Ht 63.0 in | Wt 150.0 lb

## 2021-10-21 DIAGNOSIS — J849 Interstitial pulmonary disease, unspecified: Secondary | ICD-10-CM | POA: Diagnosis not present

## 2021-10-21 DIAGNOSIS — Z23 Encounter for immunization: Secondary | ICD-10-CM

## 2021-10-21 DIAGNOSIS — R202 Paresthesia of skin: Secondary | ICD-10-CM

## 2021-10-21 DIAGNOSIS — R0600 Dyspnea, unspecified: Secondary | ICD-10-CM

## 2021-10-21 NOTE — Patient Instructions (Addendum)
ICD-10-CM   1. ILD (interstitial lung disease) ([facility])  J84.9     2. Paroxysmal nocturnal dyspnea  R06.00     3. Paresthesia  R20.2       ILD (interstitial lung disease) (HCC) Shortness of breath   Subtle mild early ILD since 2021 -> 2023; looks stable. Not sure if was there in 2015 - I doubt it -Clinically this is not consistent with IPF.  The odds of this being IPF which is the aggressive variety is less than 10%.  Most likely the non-- IPF variety.  Clinically stable and mild.  Would advocate for a more conservative course of management given the toxicities with antifibrotic's and some amount of morbidity with biopsy  Plan -   - Serum: ESR, ACE, ANA, DS-DNA, RF, anti-CCP,   Total CK,  Aldolase,  scl-70, ssA, ssB,)  & Hypersensitivity Pneumonitis Panel, Quantiferon gold, BNP, cbc witgh diff and IgE - do full PFT in 3 months - continue breo schduled for now - reasess in [facility] -- high dose flu shot 10/21/2021    Paroxysmal nocturnal dyspnea Paresthesia  - ? Anxiety attack  Plan - check echo - check BNP  blood work - check ONO test on room air    Followup  -3 months - 30 min visit; but after spiro/dlco  - walk and ILD symptoms score at followup

## 2021-10-21 NOTE — Progress Notes (Signed)
OV 10/21/2021 to Dr. Chase Caller at the ILD center by Dr. Crist Infante  Subjective:  Patient ID: Melissa Tapia, female , DOB: [birth year] , age 81 y.o. , MRN: [id number] , ADDRESS: [address] [place] [facility] [id number] PCP Crist Infante, MD Patient Care Team: Crist Infante, MD as PCP - General (Internal Medicine)  This Provider for this visit: Treatment Team:  Attending Provider: Brand Males, MD    10/21/2021 -   Chief Complaint  Patient presents with   Consult    Pt recently had a CT performed which is the reason for today's visit.  Pt states that she does have complaints of SOB that can happen at anytime.     HPI Melissa Tapia 81 y.o. -is a retired Pharmacist, hospital.  She works at [employer] in the schools and [employer].  School was built in [place] but a subsequent building was built in 1980s.  She worked in the old building and then retired at some point.  Her current symptoms are somewhat atypical with the evaluation for ILD.  She says all of a sudden in May 2023 she had phlebitis in the lower extremity and also a Baker's cyst removed but then by the end of July 2023 she had nausea and was treated with Nexium in the emergency room.  Then by September 5 and 6, 2023 she started feeling significantly different and started having shortness of breath.  By September 11 she was started on Advair September 20 this was changed to [facility] and this is actually helping her symptoms.  During all this she ended up with a high-resolution CT chest that showed ILD and therefore she has been referred here.  According to the radiologist the findings worse compared to 2015.  In my personal visualization she has had the findings at least since 2021 but possibly not in 2015.  It appears stable to me as of September 2023.  She actually does not have any chronic shortness of breath or cough.  She does report new onset paroxysmal nocturnal dyspnea more recently not otherwise specified.  She wakes up with  dyspnea also feels paresthesia in her fingers.  This has not been evaluated she is wondering if this because of anxiety.  She says recently her sister came and slept with then she did not have it.  Her husband died 66 months ago and she is living alone and this makes her a little bit nervous.  [facility] Integrated Comprehensive ILD Questionnaire  Symptoms:  x SYMPTOM SCALE - ILD 10/21/2021  Current weight   O2 use 0  Shortness of Breath 0 -> 5 scale with 5 being worst (score 6 If unable to do)  At rest 00  Simple tasks - showers, clothes change, eating, shaving 00  Household (dishes, doing bed, laundry) 0  Shopping 00  Walking level at own pace 0  Walking up Stairs 0  Total (30-36) Dyspnea Score 0      Non-dyspnea symptoms (0-> 5 scale) 10/21/2021  How bad is your cough? 0  How bad is your fatigue 3  How bad is nausea 2  How bad is vomiting?  0  How bad is diarrhea? 0  How bad is anxiety? 4  How bad is depression 1  Any chronic pain - if so where and how bad 0     Past Medical History :  -She might have asthma per history but otherwise a lot of past medical history is negative  ROS:  She has arthralgia which she thinks is due to age - She has new onset paroxysmal nocturnal dyspnea. - She has dry mouth not otherwise specified. - After her husband died 53 months ago she is lost 8 pounds. - She has had nausea for the last few weeks this is associated with the paroxysmal nocturnal dyspnea.  FAMILY HISTORY of LUNG DISEASE:  -She thinks her dad had pulmonary fibrosis*  PERSONAL EXPOSURE HISTORY:  -She smoked some in college in 1962 but that about it.  Detailed organic and inorganic antigen exposure history in the house is negative.  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  Old building built in 1928.  A new building was built in the 1980s.  She was exposed to old buildings but does not remember any dampness or musty smell or mold..  Detailed organic and inorganic antigen exposure history is negative  PULMONARY TOXICITY HISTORY (27 items):  -.  This is negative.  INVESTIGATIONS: x   CT Chest data - HRCT 09/30/21   Narrative & Impression  CLINICAL DATA:  Pulmonary fibrosis   EXAM: CT CHEST WITHOUT CONTRAST   TECHNIQUE: Multidetector CT imaging of the chest was performed following the standard protocol without intravenous contrast. High resolution imaging of the lungs, as well as inspiratory and expiratory imaging, was performed.   RADIATION DOSE REDUCTION: This exam was performed according to the departmental dose-optimization program which includes automated exposure control, adjustment of the mA and/or kV according to patient size and/or use of iterative reconstruction technique.   COMPARISON:  CT chest pulmonary angiogram, 09/24/2021, CT chest coronary calcium scoring, 09/11/2019, CT abdomen pelvis, 04/24/2013   FINDINGS: Cardiovascular: Aortic atherosclerosis. Normal heart size. Scattered left coronary artery calcifications. No pericardial effusion.   Mediastinum/Nodes: No enlarged mediastinal, hilar, or axillary lymph nodes. Small hiatal hernia. Thyroid gland, trachea, and esophagus demonstrate no significant findings.   Lungs/Pleura: Mild pulmonary fibrosis in a pattern with apical to basal gradient, featuring irregular peripheral interstitial opacity and ground-glass, as well as traction bronchiectasis at the lung bases, however without clear evidence of subpleural bronchiolectasis or honeycombing. Fibrotic findings appear very slightly worsened over time when compared to the lung bases as included on examination dated 04/24/2013. No significant air trapping on expiratory  phase imaging. No pleural effusion or pneumothorax.   Upper Abdomen: No acute abnormality.   Musculoskeletal: No chest wall abnormality. No acute osseous findings.   IMPRESSION: 1. Mild pulmonary fibrosis in a pattern with apical to basal gradient, featuring irregular peripheral interstitial opacity and ground-glass, as well as traction bronchiectasis at the lung bases, however without clear evidence of subpleural bronchiolectasis or honeycombing. Fibrotic findings appear very slightly worsened over time when compared to the lung bases as included on examination dated 04/24/2013. Findings are indeterminate for UIP per consensus guidelines, differential considerations including both UIP and NSIP: Diagnosis of Idiopathic Pulmonary Fibrosis: An Official ATS/ERS/JRS/ALAT Clinical Practice Guideline. [place], Iss 5, [phone], Sep 17 2016. Consider pulmonary referral and interstitial lung disease protocol CT follow-up in 1 year if indicated by clinical suspicion for fibrotic interstitial lung disease. 2. Coronary artery disease. 3. Cholelithiasis.   Aortic Atherosclerosis (ICD10-I70.0).     Electronically Signed   By:  Delanna Ahmadi M.D.   On: 10/01/2021 10:02    No results found.  Simple office walk 185 feet x  3 laps goal with forehead probe 10/21/2021    O2 used ra   Number laps completed 3   Comments about pace avg   Resting Pulse Ox/HR 100% and 85/min   Final Pulse Ox/HR 98% and 112/min   Desaturated </= 88% no   Desaturated <= 3% points no   Got Tachycardic >/= 90/min yes   Symptoms at end of test nonex   Miscellaneous comments x      PFT      No data to display             has a past medical history of Hypercholesteremia and Hypertension.   reports that she has quit smoking. Her smoking use included cigarettes. She has been exposed to tobacco smoke. She has never used smokeless tobacco.  No past surgical history on  file.  Allergies  Allergen Reactions   Sulfa Antibiotics Rash    In 1963    Immunization History  Administered Date(s) Administered   Fluad Quad(high Dose 65+) 10/21/2021   Influenza-Unspecified 10/31/2020   PFIZER(Purple Top)SARS-COV-2 Vaccination 02/11/2019, 03/04/2019   Pfizer Covid-19 Vaccine Bivalent Booster 63yrs & up 10/13/2020   Pneumococcal-Unspecified 10/31/2020    No family history on file.   Current Outpatient Medications:    atorvastatin (LIPITOR) 10 MG tablet, Take 10 mg by mouth at bedtime., Disp: , Rfl:    BREO ELLIPTA 100-25 MCG/ACT AEPB, Inhale into the lungs., Disp: , Rfl:    Calcium-Vitamin D (CALTRATE 600 PLUS-VIT D PO), Caltrate 600 plus D, Disp: , Rfl:    Cholecalciferol 25 MCG (1000 UT) capsule, Take by mouth., Disp: , Rfl:    Cyanocobalamin (B-12 COMPLIANCE INJECTION IJ), Inject as directed., Disp: , Rfl:    cyanocobalamin (VITAMIN B12) 1000 MCG tablet, Take 1 tablet by mouth daily., Disp: , Rfl:    denosumab (PROLIA) 60 MG/ML SOSY injection, first was 8/20 Subcutaneous, Disp: , Rfl:    mirtazapine (REMERON) 15 MG tablet, Take 15 mg by mouth at bedtime., Disp: , Rfl:    Multiple Vitamins-Minerals (CENTRUM SILVER 50+WOMEN) TABS, Centrum, Disp: , Rfl:    Omega-3 Fatty Acids (FISH OIL) 1200 MG CAPS, Take one cap, po, once a day Oral, Disp: , Rfl:    omeprazole (PRILOSEC OTC) 20 MG tablet, 1 tablet 30 minutes before morning meal Orally Once a day for 90 days, Disp: , Rfl:    telmisartan (MICARDIS) 20 MG tablet, 1/2 TABLET ORALLY ONCE A DAY IN THE EVENING 90 DAYS Orally Once a day for 90 days, Disp: , Rfl:       Objective:   Vitals:   10/21/21 1428  BP: 130/76  Pulse: 85  Temp: 97.9 F (36.6 C)  TempSrc: Oral  SpO2: 100%  Weight: 150 lb (68 kg)  Height: $Remove'5\' 3"'HNFjiXZ$  (1.6 m)    Estimated body mass index is 26.57 kg/m as calculated from the following:   Height as of this encounter: $RemoveBeforeD'5\' 3"'sXgRpdFRCCyqUl$  (1.6 m).   Weight as of this encounter: 150 lb (68  kg).  $Rem'@WEIGHTCHANGE'BMVp$ @  Filed Weights   10/21/21 1428  Weight: 150 lb (68 kg)     Physical Exam    General: No distress. Looks well Neuro: Alert and Oriented x 3. GCS 15. Speech normal Psych: Pleasant Resp:  Barrel Chest - no.  Wheeze - no, Crackles - mild at base, No overt respiratory distress  CVS: Normal heart sounds. Murmurs - no Ext: Stigmata of Connective Tissue Disease - no HEENT: Normal upper airway. PEERL +. No post nasal drip        Assessment:       ICD-10-CM   1. ILD (interstitial lung disease) (HCC)  J84.9 Sedimentation rate    Angiotensin converting enzyme    ANA    Anti-DNA antibody, double-stranded    Rheumatoid factor    Cyclic citrul peptide antibody, IgG    CK total and CKMB (cardiac)not at [facility]    Aldolase    Anti-scleroderma antibody    Sjogren's syndrome antibods(ssa + ssb)    Brain natriuretic peptide    ECHOCARDIOGRAM COMPLETE    Pulmonary function test    Pulse oximetry, overnight    Brain natriuretic peptide    Sjogren's syndrome antibods(ssa + ssb)    Anti-scleroderma antibody    Aldolase    CK total and CKMB (cardiac)not at [facility]    Cyclic citrul peptide antibody, IgG    Rheumatoid factor    Anti-DNA antibody, double-stranded    ANA    Angiotensin converting enzyme    Sedimentation rate    2. Paroxysmal nocturnal dyspnea  R06.00     3. Paresthesia  R20.2     4. Need for immunization against influenza  Z23 Flu Vaccine QUAD High Dose(Fluad)         Plan:     Patient Instructions     ICD-10-CM   1. ILD (interstitial lung disease) ([place])  J84.9     2. Paroxysmal nocturnal dyspnea  R06.00     3. Paresthesia  R20.2       ILD (interstitial lung disease) (HCC) Shortness of breath   Subtle mild early ILD since 2021 -> 2023; looks stable. Not sure if was there in 2015 - I doubt it -Clinically this is not consistent with IPF.  The odds of this being IPF which is the aggressive variety is less than 10%.  Most likely the non--  IPF variety.  Clinically stable and mild.  Would advocate for a more conservative course of management given the toxicities with antifibrotic's and some amount of morbidity with biopsy  Plan -   - Serum: ESR, ACE, ANA, DS-DNA, RF, anti-CCP,   Total CK,  Aldolase,  scl-70, ssA, ssB,)  & Hypersensitivity Pneumonitis Panel, Quantiferon gold, BNP, cbc witgh diff and IgE - do full PFT in 3 months - continue breo schduled for now - reasess in [facility] -- high dose flu shot 10/21/2021    Paroxysmal nocturnal dyspnea Paresthesia  - ? Anxiety attack  Plan - check echo - check BNP  blood work - check ONO test on room air    Followup  -3 months - 30 min visit; but after spiro/dlco  - walk and ILD symptoms score at followup     SIGNATURE    Dr. Brand Males, M.D., F.C.C.P,  Pulmonary and Critical Care Medicine Staff Physician, [place] Director - Interstitial Lung Disease  Program  Pulmonary [place] at [place], [place], [address]  Pager: [phone], If no answer or between  15:00h - 7:00h: call [phone] Telephone: [phone]  4:59 PM 10/21/2021

## 2021-10-22 LAB — SJOGREN'S SYNDROME ANTIBODS(SSA + SSB)
SSA (Ro) (ENA) Antibody, IgG: <1 AI
SSB (La) (ENA) Antibody, IgG: <1 AI

## 2021-10-22 LAB — SEDIMENTATION RATE: Sed Rate: 1 mm/hr (ref 0–30)

## 2021-10-22 LAB — BRAIN NATRIURETIC PEPTIDE: Pro B Natriuretic peptide (BNP): 21 pg/mL (ref 0.0–100.0)

## 2021-10-22 LAB — RHEUMATOID FACTOR: Rheumatoid fact SerPl-aCnc: <14 IU/mL (ref <14)

## 2021-10-22 LAB — ANGIOTENSIN CONVERTING ENZYME: Angiotensin-Converting Enzyme: 20 U/L (ref 9–67)

## 2021-10-22 LAB — ANTI-SCLERODERMA ANTIBODY: Scleroderma (Scl-70) (ENA) Antibody, IgG: <1 AI

## 2021-10-22 LAB — ANA: Anti Nuclear Antibody (ANA): NEGATIVE

## 2021-10-22 LAB — ANTI-DNA ANTIBODY, DOUBLE-STRANDED: ds DNA Ab: <1 IU/mL

## 2021-10-22 LAB — CYCLIC CITRUL PEPTIDE ANTIBODY, IGG: Cyclic Citrullin Peptide Ab: <16 UNITS

## 2021-10-25 DIAGNOSIS — E538 Deficiency of other specified B group vitamins: Secondary | ICD-10-CM | POA: Diagnosis not present

## 2021-10-25 DIAGNOSIS — E785 Hyperlipidemia, unspecified: Secondary | ICD-10-CM | POA: Diagnosis not present

## 2021-10-25 DIAGNOSIS — M810 Age-related osteoporosis without current pathological fracture: Secondary | ICD-10-CM | POA: Diagnosis not present

## 2021-10-25 DIAGNOSIS — E039 Hypothyroidism, unspecified: Secondary | ICD-10-CM | POA: Diagnosis not present

## 2021-10-25 DIAGNOSIS — R7989 Other specified abnormal findings of blood chemistry: Secondary | ICD-10-CM | POA: Diagnosis not present

## 2021-10-25 DIAGNOSIS — I10 Essential (primary) hypertension: Secondary | ICD-10-CM | POA: Diagnosis not present

## 2021-10-25 LAB — CK TOTAL AND CKMB (NOT AT ARMC)
CK, MB: 0.9 ng/mL (ref 0–5.0)
Relative Index: 1.4 (ref 0–4.0)
Total CK: 65 U/L (ref 29–143)

## 2021-10-25 LAB — ALDOLASE: Aldolase: 4.8 U/L (ref <=8.1)

## 2021-11-01 DIAGNOSIS — R7301 Impaired fasting glucose: Secondary | ICD-10-CM | POA: Diagnosis not present

## 2021-11-01 DIAGNOSIS — I868 Varicose veins of other specified sites: Secondary | ICD-10-CM | POA: Diagnosis not present

## 2021-11-01 DIAGNOSIS — M810 Age-related osteoporosis without current pathological fracture: Secondary | ICD-10-CM | POA: Diagnosis not present

## 2021-11-01 DIAGNOSIS — Z1331 Encounter for screening for depression: Secondary | ICD-10-CM | POA: Diagnosis not present

## 2021-11-01 DIAGNOSIS — I2510 Atherosclerotic heart disease of native coronary artery without angina pectoris: Secondary | ICD-10-CM | POA: Diagnosis not present

## 2021-11-01 DIAGNOSIS — I700 Atherosclerosis of aorta: Secondary | ICD-10-CM | POA: Diagnosis not present

## 2021-11-01 DIAGNOSIS — R82998 Other abnormal findings in urine: Secondary | ICD-10-CM | POA: Diagnosis not present

## 2021-11-01 DIAGNOSIS — Z1339 Encounter for screening examination for other mental health and behavioral disorders: Secondary | ICD-10-CM | POA: Diagnosis not present

## 2021-11-01 DIAGNOSIS — M5136 Other intervertebral disc degeneration, lumbar region: Secondary | ICD-10-CM | POA: Diagnosis not present

## 2021-11-01 DIAGNOSIS — E538 Deficiency of other specified B group vitamins: Secondary | ICD-10-CM | POA: Diagnosis not present

## 2021-11-01 DIAGNOSIS — I7121 Aneurysm of the ascending aorta, without rupture: Secondary | ICD-10-CM | POA: Diagnosis not present

## 2021-11-01 DIAGNOSIS — Z0000 Encounter for general adult medical examination without abnormal findings: Secondary | ICD-10-CM | POA: Diagnosis not present

## 2021-11-01 DIAGNOSIS — I10 Essential (primary) hypertension: Secondary | ICD-10-CM | POA: Diagnosis not present

## 2021-11-04 ENCOUNTER — Other Ambulatory Visit (HOSPITAL_COMMUNITY): Payer: Self-pay

## 2021-11-08 ENCOUNTER — Ambulatory Visit (HOSPITAL_COMMUNITY)
Admission: RE | Admit: 2021-11-08 | Discharge: 2021-11-08 | Disposition: A | Discharge status: Home / Self Care | Payer: Medicare HMO | Source: Ambulatory Visit | Attending: Internal Medicine | Admitting: Internal Medicine

## 2021-11-08 DIAGNOSIS — M810 Age-related osteoporosis without current pathological fracture: Secondary | ICD-10-CM | POA: Insufficient documentation

## 2021-11-08 DIAGNOSIS — Z1231 Encounter for screening mammogram for malignant neoplasm of breast: Secondary | ICD-10-CM | POA: Diagnosis not present

## 2021-11-08 MED ORDER — DENOSUMAB 60 MG/ML ~~LOC~~ SOSY
60.0000 mg | PREFILLED_SYRINGE | Freq: Once | SUBCUTANEOUS | Status: AC
  Administered 2021-11-08: 60 mg via SUBCUTANEOUS

## 2021-11-08 MED ORDER — DENOSUMAB 60 MG/ML ~~LOC~~ SOSY
PREFILLED_SYRINGE | SUBCUTANEOUS | Status: AC
  Filled 2021-11-08: qty 1

## 2021-11-11 ENCOUNTER — Telehealth: Payer: Self-pay | Admitting: Internal Medicine

## 2021-11-11 NOTE — Telephone Encounter (Signed)
Called and spoke to patient and she states that she has not heard anything from [facility] about her over night oxygen test. I gave her the number for [facility] and advised her to call them for an update with the ONO. Nothing further needed

## 2021-11-15 ENCOUNTER — Ambulatory Visit (HOSPITAL_COMMUNITY): Payer: Medicare HMO | Attending: Internal Medicine

## 2021-11-15 DIAGNOSIS — I10 Essential (primary) hypertension: Secondary | ICD-10-CM | POA: Insufficient documentation

## 2021-11-15 DIAGNOSIS — J849 Interstitial pulmonary disease, unspecified: Secondary | ICD-10-CM | POA: Diagnosis not present

## 2021-11-15 DIAGNOSIS — Z87891 Personal history of nicotine dependence: Secondary | ICD-10-CM | POA: Insufficient documentation

## 2021-11-15 DIAGNOSIS — E785 Hyperlipidemia, unspecified: Secondary | ICD-10-CM | POA: Insufficient documentation

## 2021-11-15 DIAGNOSIS — I371 Nonrheumatic pulmonary valve insufficiency: Secondary | ICD-10-CM

## 2021-11-15 DIAGNOSIS — I361 Nonrheumatic tricuspid (valve) insufficiency: Secondary | ICD-10-CM

## 2021-11-15 LAB — ECHOCARDIOGRAM COMPLETE
Area-P 1/2: 3.91 cm2
S' Lateral: 2.3 cm

## 2021-11-23 DIAGNOSIS — J849 Interstitial pulmonary disease, unspecified: Secondary | ICD-10-CM | POA: Diagnosis not present

## 2021-11-24 ENCOUNTER — Telehealth: Payer: Self-pay | Admitting: Internal Medicine

## 2021-11-24 NOTE — Telephone Encounter (Signed)
  ONO - you have to show me when I am in next week   BNP normal ECHO - very mild heart muscle stiffness can contribue to dyspnea but is c/w general aging process/weight/BP  My apologies for  delay   Latest Reference Range & Units 10/21/21 15:37  CK Total 29 - 143 U/L 65  CK, MB 0 - 5.0 ng/mL 0.9  Pro B Natriuretic peptide (BNP) 0.0 - 100.0 pg/mL 21.0    eCH  IMPRESSIONS     1. Left ventricular ejection fraction, by estimation, is 60 to 65%. Left  ventricular ejection fraction by 3D volume is 65 %. The left ventricle has  normal function. The left ventricle has no regional wall motion  abnormalities. Left ventricular diastolic   parameters are consistent with Grade I diastolic dysfunction (impaired  relaxation).   2. Right ventricular systolic function is normal. The right ventricular  size is normal. There is normal pulmonary artery systolic pressure.   3. The mitral valve is normal in structure. No evidence of mitral valve  regurgitation. No evidence of mitral stenosis.   4. The aortic valve is tricuspid. Aortic valve regurgitation is not  visualized. No aortic stenosis is present.   5. The inferior vena cava is normal in size with greater than 50%  respiratory variability, suggesting right atrial pressure of 3 mmHg.

## 2021-11-24 NOTE — Telephone Encounter (Signed)
Called and spoke to patient about Dr Chase Caller his recommendations. She was very anxious over the phone about her needing oxygen and cpap. I did explain the difference in cpap and oxygen for her. She is really worried because she states she is not sleeping at all at night. She asked if I can ask Dr Chase Caller about sleep apnea.   Sir she is aware that you will look at ONO when you come to the office next week. But she sounds very anxious and really worried.   Patient is also asking about if she is needing to repeat the ONO. I advised her that we will have to wait for the Dr to look at the original ono and he will determine if a repeat test is needed   Please advise sir

## 2021-11-24 NOTE — Telephone Encounter (Signed)
Pt called the office stating that she had an echocardiogram performed as well as bloodwork and states that she has not heard anything about any of the results. Pt also stated that she had a recent ONO and has not heard anything about the results of that.  Pt said she is unsure how accurate the ONO results are going to be as she said she did not sleep well that night.  MR, please advise on all this for pt.

## 2021-11-29 NOTE — Telephone Encounter (Signed)
  Overnight pulse oximetry done on 11/19/2021 shows pulse ox less than 88% for 7 minutes.  Her autoimmune profile and BNP is normal.  Echo is essentially normal except for extremely mild heart muscle stiffness which can explain some amount of shortness of breath with exertion  The oxygen desaturation test is borderline.  If she wants she can try 1 L of nasal cannula at night and see if overall she is better in the daytime.  On the other hand if she wants to wait and watch that is fine too  Of note they forgot to do her CBC with differential and blood IgE and QuantiFERON gold. ->  Please make sure these results are not pending.  If it is not done she can do it at some point in time in the future.  Not urgent    Latest Reference Range & Units 10/21/21 15:37  CK Total 29 - 143 U/L 65  CK, MB 0 - 5.0 ng/mL 0.9  Pro B Natriuretic peptide (BNP) 0.0 - 100.0 pg/mL 21.0  Aldolase < OR = 8.1 U/L 4.8  Sed Rate 0 - 30 mm/hr 1  Anti Nuclear Antibody (ANA) NEGATIVE  NEGATIVE  Angiotensin-Converting Enzyme 9 - 67 U/L 20  Cyclic Citrullin Peptide Ab UNITS <16  ds DNA Ab IU/mL <1  RA Latex Turbid. <14 IU/mL <14  SSA (Ro) (ENA) Antibody, IgG <1.0 NEG AI <1.0 NEG  SSB (La) (ENA) Antibody, IgG <1.0 NEG AI <1.0 NEG  Scleroderma (Scl-70) (ENA) Antibody, IgG <1.0 NEG AI <1.0 NEG    ECHO 11/15/21   IMPRESSIONS     1. Left ventricular ejection fraction, by estimation, is 60 to 65%. Left  ventricular ejection fraction by 3D volume is 65 %. The left ventricle has  normal function. The left ventricle has no regional wall motion  abnormalities. Left ventricular diastolic   parameters are consistent with Grade I diastolic dysfunction (impaired  relaxation).   2. Right ventricular systolic function is normal. The right ventricular  size is normal. There is normal pulmonary artery systolic pressure.   3. The mitral valve is normal in structure. No evidence of mitral valve  regurgitation. No evidence of mitral  stenosis.   4. The aortic valve is tricuspid. Aortic valve regurgitation is not  visualized. No aortic stenosis is present.   5. The inferior vena cava is normal in size with greater than 50%  respiratory variability, suggesting right atrial pressure of 3 mmHg.

## 2021-11-29 NOTE — Telephone Encounter (Signed)
Called and spoke with pt speaking to her about the oxygen and ONO results. Pt said she would have to think about this and would call us back after she further thought about this.

## 2021-11-29 NOTE — Telephone Encounter (Signed)
I called and spoke with the pt  She is aware of results/recs per Dr Chase Caller  She states that she wants to think about the o2 and call us back  She is unsure if she is ready to start on this  Pt very overwhelmed and I did not mention non urgent need for more labs  Will discuss this when she calls back Will forward to [place] to f/u on

## 2021-12-06 ENCOUNTER — Telehealth: Payer: Self-pay | Admitting: Internal Medicine

## 2021-12-06 DIAGNOSIS — G4734 Idiopathic sleep related nonobstructive alveolar hypoventilation: Secondary | ICD-10-CM

## 2021-12-07 NOTE — Telephone Encounter (Signed)
Spoke to pt and she states she is ready to try 1L of at night O2. I placed order for DME and assured her they will contact her. Pt verbalized understanding. Nothing further needed.

## 2021-12-14 ENCOUNTER — Telehealth: Payer: Self-pay | Admitting: Internal Medicine

## 2021-12-14 NOTE — Telephone Encounter (Signed)
This patient spoke with me via phone and has refused O2. She stated she has a pillow that's been helping her sleep much better and would like to wait til she have another F2F appt to be re-evaluated.   I've let Jasmine at [employer] know to hold off on the order.

## 2021-12-27 DIAGNOSIS — L821 Other seborrheic keratosis: Secondary | ICD-10-CM | POA: Diagnosis not present

## 2021-12-27 DIAGNOSIS — D1801 Hemangioma of skin and subcutaneous tissue: Secondary | ICD-10-CM | POA: Diagnosis not present

## 2021-12-27 DIAGNOSIS — L738 Other specified follicular disorders: Secondary | ICD-10-CM | POA: Diagnosis not present

## 2021-12-27 DIAGNOSIS — D485 Neoplasm of uncertain behavior of skin: Secondary | ICD-10-CM | POA: Diagnosis not present

## 2021-12-27 DIAGNOSIS — L304 Erythema intertrigo: Secondary | ICD-10-CM | POA: Diagnosis not present

## 2021-12-27 DIAGNOSIS — L739 Follicular disorder, unspecified: Secondary | ICD-10-CM | POA: Diagnosis not present

## 2021-12-31 DIAGNOSIS — H52223 Regular astigmatism, bilateral: Secondary | ICD-10-CM | POA: Diagnosis not present

## 2021-12-31 DIAGNOSIS — H5213 Myopia, bilateral: Secondary | ICD-10-CM | POA: Diagnosis not present

## 2022-01-04 DIAGNOSIS — K219 Gastro-esophageal reflux disease without esophagitis: Secondary | ICD-10-CM | POA: Diagnosis not present

## 2022-01-04 DIAGNOSIS — I10 Essential (primary) hypertension: Secondary | ICD-10-CM | POA: Diagnosis not present

## 2022-01-04 DIAGNOSIS — J8410 Pulmonary fibrosis, unspecified: Secondary | ICD-10-CM | POA: Diagnosis not present

## 2022-01-04 DIAGNOSIS — B370 Candidal stomatitis: Secondary | ICD-10-CM | POA: Diagnosis not present

## 2022-01-23 NOTE — Patient Instructions (Signed)
   ILD (interstitial lung disease) (HCC)    Subtle mild early ILD since 2021 -> 2023; looks stable. Not sure if was there in 2015 - I doubt it -Clinically this is not consistent with IPF.  Pulmonary function test in January 2024 is normal.  Mild desaturations at night.  Serology profile is negative    Plan -Supportive expectant approach with serial monitoring -Do spirometry and DLCO in 6 months -No antifibrotic or biopsy recommended at this point in time  Vaccine counseling  Plan - Highly recommend RSV vaccine  Paroxysmal nocturnal dyspnea   - likely  Anxiety attack because cardiac echo and BNP blood test was normal and you are currently better after controlling anxiety  Plan -Expectant monitoring - Can stop fish oil to avoid any acid reflux potentially that might contribute to this problem  Dry mouth  -Probably from [facility]  Plan - Can try to add flaxseed to your food -Also try Biotene mouthwash   Oral thrush  - Currently not present due to treatment by primary care physician  Plan - According to the primary care physician Dr. Hervey Ard  Hx of asthma  Plan   - contniue breo   Followup  -6 months - 30 min visit; but after spiro/dlco  - walk and ILD symptoms score at followup

## 2022-01-23 NOTE — Progress Notes (Unsigned)
OV 10/21/2021 to Dr. Chase Caller at the ILD center by Dr. Crist Infante  Subjective:  Patient ID: Melissa Tapia, female , DOB: [birth year] , age 82 y.o. , MRN: [id number] , ADDRESS: [address] [facility] [id number] PCP Crist Infante, MD Patient Care Team: Crist Infante, MD as PCP - General (Internal Medicine)  This Provider for this visit: Treatment Team:  Attending Provider: Brand Males, MD    10/21/2021 -   Chief Complaint  Patient presents with   Consult    Pt recently had a CT performed which is the reason for today's visit.  Pt states that she does have complaints of SOB that can happen at anytime.     HPI Melissa Tapia 82 y.o. -is a retired Pharmacist, hospital.  She works at [employer] in the schools and [employer].  School was built in [place] but a subsequent building was built in 1980s.  She worked in the old building and then retired at some point.  Her current symptoms are somewhat atypical with the evaluation for ILD.  She says all of a sudden in May 2023 she had phlebitis in the lower extremity and also a Baker's cyst removed but then by the end of July 2023 she had nausea and was treated with Nexium in the emergency room.  Then by September 5 and 6, 2023 she started feeling significantly different and started having shortness of breath.  By September 11 she was started on Advair September 20 this was changed to [facility] and this is actually helping her symptoms.  During all this she ended up with a high-resolution CT chest that showed ILD and therefore she has been referred here.  According to the radiologist the findings worse compared to 2015.  In my personal visualization she has had the findings at least since 2021 but possibly not in 2015.  It appears stable to me as of September 2023.  She actually does not have any chronic shortness of breath or cough.  She does report new onset paroxysmal nocturnal dyspnea more recently not otherwise specified.  She wakes up with  dyspnea also feels paresthesia in her fingers.  This has not been evaluated she is wondering if this because of anxiety.  She says recently her sister came and slept with then she did not have it.  Her husband died 60 months ago and she is living alone and this makes her a little bit nervous.  [facility] Comprehensive ILD Questionnaire  Symptoms:  x Past Medical History :  -She might have asthma per history but otherwise a lot of past medical history is negative   ROS:  She has arthralgia which she thinks is due to age - She has new onset paroxysmal nocturnal dyspnea. - She has dry mouth not otherwise specified. - After her husband died 68 months ago she is lost 8 pounds. - She has had nausea for the last few weeks this is associated with the paroxysmal nocturnal dyspnea.  FAMILY HISTORY of LUNG DISEASE:  -She thinks her dad had pulmonary fibrosis*  PERSONAL EXPOSURE HISTORY:  -She smoked some in college in 1962 but that about it.  Detailed organic and inorganic antigen exposure history in the house is negative.  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  Old building built in 1928.  A new building was built in the 1980s.  She was exposed to old buildings but does not remember any dampness or musty smell or mold..  Detailed organic and inorganic antigen exposure history is negative  PULMONARY TOXICITY HISTORY (27 items):  -.  This is negative.  INVESTIGATIONS: x   CT Chest data - HRCT 09/30/21   Narrative & Impression  CLINICAL DATA:  Pulmonary fibrosis   EXAM: CT CHEST WITHOUT CONTRAST   TECHNIQUE: Multidetector CT imaging of the chest was performed following the standard protocol without intravenous contrast. High  resolution imaging of the lungs, as well as inspiratory and expiratory imaging, was performed.   RADIATION DOSE REDUCTION: This exam was performed according to the departmental dose-optimization program which includes automated exposure control, adjustment of the mA and/or kV according to patient size and/or use of iterative reconstruction technique.   COMPARISON:  CT chest pulmonary angiogram, 09/24/2021, CT chest coronary calcium scoring, 09/11/2019, CT abdomen pelvis, 04/24/2013   FINDINGS: Cardiovascular: Aortic atherosclerosis. Normal heart size. Scattered left coronary artery calcifications. No pericardial effusion.   Mediastinum/Nodes: No enlarged mediastinal, hilar, or axillary lymph nodes. Small hiatal hernia. Thyroid gland, trachea, and esophagus demonstrate no significant findings.   Lungs/Pleura: Mild pulmonary fibrosis in a pattern with apical to basal gradient, featuring irregular peripheral interstitial opacity and ground-glass, as well as traction bronchiectasis at the lung bases, however without clear evidence of subpleural bronchiolectasis or honeycombing. Fibrotic findings appear very slightly worsened over time when compared to the lung bases as included on examination dated 04/24/2013. No significant air trapping on expiratory phase imaging. No pleural effusion or pneumothorax.   Upper Abdomen: No acute abnormality.   Musculoskeletal: No chest wall abnormality. No acute osseous findings.   IMPRESSION: 1. Mild pulmonary fibrosis in a pattern with apical to basal gradient, featuring irregular peripheral interstitial opacity and ground-glass, as well as traction bronchiectasis at the lung bases, however without clear evidence of subpleural bronchiolectasis or honeycombing. Fibrotic findings appear very slightly worsened over time when compared to the lung bases as included on examination dated 04/24/2013. Findings are indeterminate for UIP per  consensus guidelines, differential considerations including both UIP and NSIP: Diagnosis of Idiopathic Pulmonary Fibrosis: An Official ATS/ERS/JRS/ALAT Clinical Practice Guideline. [place], Iss 5, [phone], Sep 17 2016. Consider pulmonary referral and interstitial lung disease protocol CT follow-up in 1 year if indicated by clinical suspicion for fibrotic interstitial lung disease. 2. Coronary artery disease. 3. Cholelithiasis.   Aortic Atherosclerosis (ICD10-I70.0).     Electronically Signed   By: Delanna Ahmadi M.D.   On: 10/01/2021 10:02    No results found.    OV 01/24/2022  Subjective:  Patient ID: Melissa Tapia, female , DOB: November 20, 1940 , age 73 y.o. , MRN: [id number] , ADDRESS: [address] [facility] [id number] PCP Crist Infante, MD Patient Care Team: Crist Infante, MD as PCP - General (Internal Medicine)  This Provider for this visit: Treatment Team:  Attending Provider: Brand Males, MD    01/24/2022 -   Chief Complaint  Patient presents with   Follow-up    PFT, ILD, feeling better from last visit.   Early ILD/subclinical ILD -indeterminate for UIP September 2023 CT scan  Clinical asthma being treated with Memory Dance by primary care physician.  HPI Melissa Tapia 82 y.o. -returns for follow-up.  She continues to be asymptomatic from  a respiratory standpoint.  She used to have some paroxysmal nocturnal dyspnea but we got an echocardiogram that showed grade 1 diastolic dysfunction but otherwise normal.  BNP was normal.  She does not have any dyspnea on exertion there is no cough.  She believes the paroxysmal nocturnal dyspnea was because of anxiety.  She still gets up at night but not because of shortness of breath but more because of dry mouth.  She is taking fish oil.  She is taking Breo because of clinical asthma prescriber primary care physician she believes this is helping her.  She full pulmonary function test and this is essentially  normal with a normal FVC and DLCO.  She did have overnight desaturation test and she desaturated for 7 minutes which is very mild.  She did not want to do oxygen for this.  She has other complaints - She recently has been diagnosed by primary care physician with thrush.  She has tried different treatments.  On my exam today there is no thrush.  She is continuing with those.  She understands Memory Dance is a risk factor for this but she believes Memory Dance is helping her.  We talked about doing Biotene and also flaxseed and she is going to do this   - She has not had RSV vaccine.  She discussed with primary care physician was recommended.  I seconded the recommendation very strongly.    SYMPTOM SCALE - ILD 10/21/2021   Current weight    O2 use 0   Shortness of Breath 0 -> 5 scale with 5 being worst (score 6 If unable to do)   At rest 00   Simple tasks - showers, clothes change, eating, shaving 00   Household (dishes, doing bed, laundry) 0   Shopping 00   Walking level at own pace 0   Walking up Stairs 0   Total (30-36) Dyspnea Score 0       Non-dyspnea symptoms (0-> 5 scale) 10/21/2021   How bad is your cough? 0   How bad is your fatigue 3   How bad is nausea 2   How bad is vomiting?  0   How bad is diarrhea? 0   How bad is anxiety? 4   How bad is depression 1   Any chronic pain - if so where and how bad 0    Simple office walk 185 feet x  3 laps goal with forehead probe 10/21/2021    O2 used ra   Number laps completed 3   Comments about pace avg   Resting Pulse Ox/HR 100% and 85/min   Final Pulse Ox/HR 98% and 112/min   Desaturated </= 88% no   Desaturated <= 3% points no   Got Tachycardic >/= 90/min yes   Symptoms at end of test nonex   Miscellaneous comments x       Latest Reference Range & Units 10/21/21 15:37  CK Total 29 - 143 U/L 65  CK, MB 0 - 5.0 ng/mL 0.9  Pro B Natriuretic peptide (BNP) 0.0 - 100.0 pg/mL 21.0  Aldolase < OR = 8.1 U/L 4.8  Sed Rate 0 - 30 mm/hr 1   Anti Nuclear Antibody (ANA) NEGATIVE  NEGATIVE  Angiotensin-Converting Enzyme 9 - 67 U/L 20  Cyclic Citrullin Peptide Ab UNITS <16  ds DNA Ab IU/mL <1  RA Latex Turbid. <14 IU/mL <14  SSA (Ro) (ENA) Antibody, IgG <1.0 NEG AI <1.0 NEG  SSB (La) (ENA) Antibody, IgG <1.0 NEG AI <  1.0 NEG  Scleroderma (Scl-70) (ENA) Antibody, IgG <1.0 NEG AI <1.0 NEG    ECHO OCt 2023   IMPRESSIONS     1. Left ventricular ejection fraction, by estimation, is 60 to 65%. Left  ventricular ejection fraction by 3D volume is 65 %. The left ventricle has  normal function. The left ventricle has no regional wall motion  abnormalities. Left ventricular diastolic   parameters are consistent with Grade I diastolic dysfunction (impaired  relaxation).   2. Right ventricular systolic function is normal. The right ventricular  size is normal. There is normal pulmonary artery systolic pressure.   3. The mitral valve is normal in structure. No evidence of mitral valve  regurgitation. No evidence of mitral stenosis.   4. The aortic valve is tricuspid. Aortic valve regurgitation is not  visualized. No aortic stenosis is present.   5. The inferior vena cava is normal in size with greater than 50%  respiratory variability, suggesting right atrial pressure of 3 mmHg.    Pulmonary function test personally reviewed.    Latest Ref Rng & Units 01/24/2022    2:07 PM  PFT Results  FVC-Pre L 2.12  P  FVC-Predicted Pre % 86  P  Pre FEV1/FVC % % 89  P  FEV1-Pre L 1.89  P  FEV1-Predicted Pre % 104  P  DLCO uncorrected ml/min/mmHg 15.70  P  DLCO UNC% % 86  P  DLCO corrected ml/min/mmHg 15.70  P  DLCO COR %Predicted % 86  P  DLVA Predicted % 110  P  TLC L 4.98  P  TLC % Predicted % 101  P  RV % Predicted % 90  P    P Preliminary result       has a past medical history of Hypercholesteremia and Hypertension.   reports that she has quit smoking. Her smoking use included cigarettes. She has been exposed to tobacco  smoke. She has never used smokeless tobacco.  No past surgical history on file.  Allergies  Allergen Reactions   Aspirin Nausea Only   Sulfa Antibiotics Rash    In 1963    Immunization History  Administered Date(s) Administered   Fluad Quad(high Dose 65+) 10/21/2021   Influenza-Unspecified 10/31/2020   PFIZER(Purple Top)SARS-COV-2 Vaccination 02/11/2019, 03/04/2019   Pfizer Covid-19 Vaccine Bivalent Booster 62yr & up 10/13/2020   Pneumococcal-Unspecified 10/31/2020    No family history on file.   Current Outpatient Medications:    BREO ELLIPTA 100-25 MCG/ACT AEPB, Inhale into the lungs., Disp: , Rfl:    atorvastatin (LIPITOR) 10 MG tablet, Take 10 mg by mouth at bedtime., Disp: , Rfl:    Calcium-Vitamin D (CALTRATE 600 PLUS-VIT D PO), Caltrate 600 plus D, Disp: , Rfl:    Cholecalciferol 25 MCG (1000 UT) capsule, Take by mouth., Disp: , Rfl:    Cyanocobalamin (B-12 COMPLIANCE INJECTION IJ), Inject as directed., Disp: , Rfl:    cyanocobalamin (VITAMIN B12) 1000 MCG tablet, Take 1 tablet by mouth daily., Disp: , Rfl:    denosumab (PROLIA) 60 MG/ML SOSY injection, first was 8/20 Subcutaneous, Disp: , Rfl:    mirtazapine (REMERON) 15 MG tablet, Take 15 mg by mouth at bedtime., Disp: , Rfl:    Multiple Vitamins-Minerals (CENTRUM SILVER 50+WOMEN) TABS, Centrum, Disp: , Rfl:    Omega-3 Fatty Acids (FISH OIL) 1200 MG CAPS, Take one cap, po, once a day Oral, Disp: , Rfl:    omeprazole (PRILOSEC OTC) 20 MG tablet, 1 tablet 30 minutes before morning meal  Orally Once a day for 90 days, Disp: , Rfl:    telmisartan (MICARDIS) 20 MG tablet, 1/2 TABLET ORALLY ONCE A DAY IN THE EVENING 90 DAYS Orally Once a day for 90 days, Disp: , Rfl:       Objective:   Vitals:   01/24/22 1536  BP: 130/80  Pulse: 87  SpO2: 98%  Weight: 148 lb (67.1 kg)  Height: '5\' 2"'$  (1.575 m)    Estimated body mass index is 27.07 kg/m as calculated from the following:   Height as of this encounter: '5\' 2"'$  (1.575  m).   Weight as of this encounter: 148 lb (67.1 kg).  '@WEIGHTCHANGE'$ @  [employer]   01/24/22 1536  Weight: 148 lb (67.1 kg)     Physical Exam    General: No distress. Looks well Neuro: Alert and Oriented x 3. GCS 15. Speech normal Psych: Pleasant Resp:  Barrel Chest - no.  Wheeze - no, Crackles - mild base, No overt respiratory distress CVS: Normal heart sounds. Murmurs - no Ext: Stigmata of Connective Tissue Disease - no HEENT: Normal upper airway. PEERL +. No post nasal drip        Assessment:       ICD-10-CM   1. ILD (interstitial lung disease) (HCC)  J84.9 Pulmonary function test    2. Oxygen desaturation during sleep  G47.34     3. Paroxysmal nocturnal dyspnea  R06.00     4. Vaccine counseling  Z71.85     5. Dry mouth  R68.2     6. Oral thrush  B37.0     7. History of asthma  Z87.09          Plan:     Patient Instructions    ILD (interstitial lung disease) ([place])    Subtle mild early ILD since 2021 -> 2023; looks stable. Not sure if was there in 2015 - I doubt it -Clinically this is not consistent with IPF.  Pulmonary function test in January 2024 is normal.  Mild desaturations at night.  Serology profile is negative    Plan -Supportive expectant approach with serial monitoring -Do spirometry and DLCO in 6 months -No antifibrotic or biopsy recommended at this point in time  Vaccine counseling  Plan - Highly recommend RSV vaccine  Paroxysmal nocturnal dyspnea   - likely  Anxiety attack because cardiac echo and BNP blood test was normal and you are currently better after controlling anxiety  Plan -Expectant monitoring - Can stop fish oil to avoid any acid reflux potentially that might contribute to this problem  Dry mouth  -Probably from [facility] - Can try to add flaxseed to your food -Also try Biotene mouthwash   Oral thrush  - Currently not present due to treatment by primary care physician  Plan - According to the  primary care physician Dr. Cherly Hensen  Hx of asthma  Plan   - contniue breo   Followup  -6 months - 30 min visit; but after spiro/dlco  - walk and ILD symptoms score at followup  Moderate Complexity OFFICE  The table below is from the 2021 E/M guidelines, first released in 2021, with minor revisions added in 2023. Must meet the requirements for 2 out of 3 dimensions to qualify.    Number and complexity of problems addressed Amount and/or complexity of data reviewed Risk of complications and/or morbidity  One or more chronic illness with mild exacerbation, progression, or side effects of treatment  Two or more stable  chronic illnesses  One undiagnosed new problem with uncertain prognosis  One acute illness with systemic symptoms   Acute complicated injury Must meet the requirements for 1 of 3 of the categories)  Category 1: Tests and documents, historian  Any combination of 3 of the following:  Assessment requiring an independent historian  Review of prior external records  Review of results of each unique test  Ordering of each unique test    Category 2: Interpretation of tests  Independent interpretation of a test perfromed by another physician/NPP  Category 3: Discuss management/tests  Discussion of magagement or tests with an external physician/NPP Prescription drug management  Decision regarding minor surgery with identfied patient or procedure risk factors  Decision regarding elective major surgery without identified patient or procedure risk factors  Diagnosis or treatment significantly limited by social determinants of health            SIGNATURE    Dr. Brand Males, M.D., F.C.C.P,  Pulmonary and Critical Care Medicine Staff Physician, [place] Director - Interstitial Lung Disease  Program  Pulmonary [place] [facility] [place], [place], [address]  Pager: [phone], If no answer  or between  15:00h - 7:00h: call [phone] Telephone: [phone]  5:28 PM 01/24/2022

## 2022-01-24 ENCOUNTER — Encounter: Payer: Self-pay | Admitting: Internal Medicine

## 2022-01-24 ENCOUNTER — Ambulatory Visit (INDEPENDENT_AMBULATORY_CARE_PROVIDER_SITE_OTHER): Payer: Medicare HMO | Admitting: Internal Medicine

## 2022-01-24 VITALS — BP 130/80 | HR 87 | Ht 62.0 in | Wt 148.0 lb

## 2022-01-24 DIAGNOSIS — Z8709 Personal history of other diseases of the respiratory system: Secondary | ICD-10-CM

## 2022-01-24 DIAGNOSIS — J849 Interstitial pulmonary disease, unspecified: Secondary | ICD-10-CM

## 2022-01-24 DIAGNOSIS — G4734 Idiopathic sleep related nonobstructive alveolar hypoventilation: Secondary | ICD-10-CM

## 2022-01-24 DIAGNOSIS — R0600 Dyspnea, unspecified: Secondary | ICD-10-CM | POA: Diagnosis not present

## 2022-01-24 DIAGNOSIS — B370 Candidal stomatitis: Secondary | ICD-10-CM | POA: Diagnosis not present

## 2022-01-24 DIAGNOSIS — Z7185 Encounter for immunization safety counseling: Secondary | ICD-10-CM | POA: Diagnosis not present

## 2022-01-24 DIAGNOSIS — R682 Dry mouth, unspecified: Secondary | ICD-10-CM

## 2022-01-24 LAB — PULMONARY FUNCTION TEST
DL/VA % pred: 110 %
DL/VA: 4.52 ml/min/mmHg/L
DLCO cor % pred: 86 %
DLCO cor: 15.7 ml/min/mmHg
DLCO unc % pred: 86 %
DLCO unc: 15.7 ml/min/mmHg
FEF 25-75 Pre: 3.42 L/sec
FEF2575-%Pred-Pre: 262 %
FEV1-%Pred-Pre: 104 %
FEV1-Pre: 1.89 L
FEV1FVC-%Pred-Pre: 121 %
FEV6-%Pred-Pre: 92 %
FEV6-Pre: 2.12 L
FEV6FVC-%Pred-Pre: 106 %
FVC-%Pred-Pre: 86 %
FVC-Pre: 2.12 L
Pre FEV1/FVC ratio: 89 %
Pre FEV6/FVC Ratio: 100 %
RV % pred: 90 %
RV: 2.15 L
TLC % pred: 101 %
TLC: 4.98 L

## 2022-01-24 NOTE — Progress Notes (Signed)
Spirometry and diffusion capacity performed today. 

## 2022-01-24 NOTE — Patient Instructions (Signed)
Spirometry and diffusion capacity performed today. 

## 2022-01-25 ENCOUNTER — Ambulatory Visit (INDEPENDENT_AMBULATORY_CARE_PROVIDER_SITE_OTHER): Payer: Medicare HMO | Admitting: Podiatry

## 2022-01-25 ENCOUNTER — Encounter: Payer: Self-pay | Admitting: Podiatry

## 2022-01-25 DIAGNOSIS — L608 Other nail disorders: Secondary | ICD-10-CM

## 2022-01-25 DIAGNOSIS — L600 Ingrowing nail: Secondary | ICD-10-CM | POA: Diagnosis not present

## 2022-01-25 NOTE — Progress Notes (Signed)
  Subjective:  Patient ID: Melissa Tapia, female    DOB: [birth year],   MRN: [id number]  Chief Complaint  Patient presents with   Nail Problem    Patient is here for bilateral great toe nails, patient states that the nails are growing downwards.    82 y.o. female presents for concern of bilateral great toenail thickness and discoloration. Relates they are growing downwards. Relates they have been like this for years. She was going to a salon before the pandameic and having them trimmed but has not been in a while. States they hurt in certain shoes.  . Denies any other pedal complaints. Denies n/v/f/c.   Past Medical History:  Diagnosis Date   Hypercholesteremia    Hypertension     Objective:  Physical Exam: Vascular: DP/PT pulses 2/4 bilateral. CFT <3 seconds. Normal hair growth on digits. No edema.  Skin. No lacerations or abrasions bilateral feet. Pincer nail deformity bilateral hallux nails with incurvation bilateral borders and some mild tenderness.  Musculoskeletal: MMT 5/5 bilateral lower extremities in DF, PF, Inversion and Eversion. Deceased ROM in DF of ankle joint.  Neurological: Sensation intact to light touch.   Assessment:   1. Ingrown right greater toenail   2. Ingrown left greater toenail   3. Pincer nail deformity      Plan:  Patient was evaluated and treated and all questions answered. Discussed pincer nail deformity and ingrown nails and treatment options.  Debrided hallux nails as courtesy with nail nippers today without incident.  Advised to continue with regular trimming at salon.  Discussed if she gets to a point were they are consistently painful or infection to return to have nail procedure preformed.  Patient to return as needed.   Lorenda Peck, DPM

## 2022-03-01 DIAGNOSIS — E538 Deficiency of other specified B group vitamins: Secondary | ICD-10-CM | POA: Diagnosis not present

## 2022-04-12 DIAGNOSIS — B370 Candidal stomatitis: Secondary | ICD-10-CM | POA: Diagnosis not present

## 2022-04-19 DIAGNOSIS — Z23 Encounter for immunization: Secondary | ICD-10-CM | POA: Diagnosis not present

## 2022-05-16 DIAGNOSIS — M25562 Pain in left knee: Secondary | ICD-10-CM | POA: Diagnosis not present

## 2022-05-16 DIAGNOSIS — M25561 Pain in right knee: Secondary | ICD-10-CM | POA: Diagnosis not present

## 2022-05-19 DIAGNOSIS — M25562 Pain in left knee: Secondary | ICD-10-CM | POA: Diagnosis not present

## 2022-05-19 DIAGNOSIS — M25561 Pain in right knee: Secondary | ICD-10-CM | POA: Diagnosis not present

## 2022-05-24 DIAGNOSIS — M5136 Other intervertebral disc degeneration, lumbar region: Secondary | ICD-10-CM | POA: Diagnosis not present

## 2022-05-24 DIAGNOSIS — K219 Gastro-esophageal reflux disease without esophagitis: Secondary | ICD-10-CM | POA: Diagnosis not present

## 2022-05-24 DIAGNOSIS — R7301 Impaired fasting glucose: Secondary | ICD-10-CM | POA: Diagnosis not present

## 2022-05-24 DIAGNOSIS — E538 Deficiency of other specified B group vitamins: Secondary | ICD-10-CM | POA: Diagnosis not present

## 2022-05-24 DIAGNOSIS — I700 Atherosclerosis of aorta: Secondary | ICD-10-CM | POA: Diagnosis not present

## 2022-05-24 DIAGNOSIS — I7121 Aneurysm of the ascending aorta, without rupture: Secondary | ICD-10-CM | POA: Diagnosis not present

## 2022-05-24 DIAGNOSIS — M810 Age-related osteoporosis without current pathological fracture: Secondary | ICD-10-CM | POA: Diagnosis not present

## 2022-05-24 DIAGNOSIS — J8410 Pulmonary fibrosis, unspecified: Secondary | ICD-10-CM | POA: Diagnosis not present

## 2022-05-24 DIAGNOSIS — E785 Hyperlipidemia, unspecified: Secondary | ICD-10-CM | POA: Diagnosis not present

## 2022-05-24 DIAGNOSIS — I10 Essential (primary) hypertension: Secondary | ICD-10-CM | POA: Diagnosis not present

## 2022-05-31 ENCOUNTER — Ambulatory Visit: Payer: Medicare HMO | Admitting: Internal Medicine

## 2022-06-08 ENCOUNTER — Other Ambulatory Visit (HOSPITAL_COMMUNITY): Payer: Self-pay | Admitting: *Deleted

## 2022-06-09 ENCOUNTER — Ambulatory Visit (HOSPITAL_COMMUNITY)
Admission: RE | Admit: 2022-06-09 | Discharge: 2022-06-09 | Disposition: A | Discharge status: Home / Self Care | Payer: Medicare HMO | Source: Ambulatory Visit | Attending: Internal Medicine | Admitting: Internal Medicine

## 2022-06-09 DIAGNOSIS — M810 Age-related osteoporosis without current pathological fracture: Secondary | ICD-10-CM | POA: Diagnosis not present

## 2022-06-09 MED ORDER — DENOSUMAB 60 MG/ML ~~LOC~~ SOSY
PREFILLED_SYRINGE | SUBCUTANEOUS | Status: AC
  Filled 2022-06-09: qty 1

## 2022-06-09 MED ORDER — DENOSUMAB 60 MG/ML ~~LOC~~ SOSY
60.0000 mg | PREFILLED_SYRINGE | Freq: Once | SUBCUTANEOUS | Status: AC
  Administered 2022-06-09: 60 mg via SUBCUTANEOUS

## 2022-06-22 NOTE — Progress Notes (Unsigned)
OV 10/21/2021 to Dr. Marchelle Gearing at the ILD center by Dr. Rodrigo Ran  Subjective:  Patient ID: Melissa Tapia, female , DOB: [birth year] , age 82 y.o. , MRN: [id number] , ADDRESS: [address] [place] [facility] [address] PCP Rodrigo Ran, MD Patient Care Team: Rodrigo Ran, MD as PCP - General (Internal Medicine)  This Provider for this visit: Treatment Team:  Attending Provider: Kalman Shan, MD    10/21/2021 -   Chief Complaint  Patient presents with   Consult    Pt recently had a CT performed which is the reason for today's visit.  Pt states that she does have complaints of SOB that can happen at anytime.     HPI Melissa Tapia 82 y.o. -is a retired Runner, broadcasting/film/video.  She works at [employer] in the schools and [employer].  School was built in [place] but a subsequent building was built in 1980s.  She worked in the old building and then retired at some point.  Her current symptoms are somewhat atypical with the evaluation for ILD.  She says all of a sudden in May 2023 she had phlebitis in the lower extremity and also a Baker's cyst removed but then by the end of July 2023 she had nausea and was treated with Nexium in the emergency room.  Then by September 5 and 6, 2023 she started feeling significantly different and started having shortness of breath.  By September 11 she was started on Advair September 20 this was changed to [facility] and this is actually helping her symptoms.  During all this she ended up with a high-resolution CT chest that showed ILD and therefore she has been referred here.  According to the radiologist the findings worse compared to 2015.  In my personal visualization she has had the findings at least since 2021 but possibly not in 2015.  It appears stable to me as of September 2023.  She actually does not have any chronic shortness of breath or cough.  She does report new onset paroxysmal nocturnal dyspnea more recently not otherwise specified.  She wakes up with dyspnea  also feels paresthesia in her fingers.  This has not been evaluated she is wondering if this because of anxiety.  She says recently her sister came and slept with then she did not have it.  Her husband died 18 months ago and she is living alone and this makes her a little bit nervous.  [facility] Comprehensive ILD Questionnaire  Symptoms:  x Past Medical History :  -She might have asthma per history but otherwise a lot of past medical history is negative   ROS:  She has arthralgia which she thinks is due to age - She has new onset paroxysmal nocturnal dyspnea. - She has dry mouth not otherwise specified. - After her husband died 18 months ago she is lost 8 pounds. - She has had nausea for the last few weeks this is associated with the paroxysmal nocturnal dyspnea.  FAMILY HISTORY of LUNG DISEASE:  -She thinks her dad had pulmonary fibrosis*  PERSONAL EXPOSURE HISTORY:  -She smoked some in college in 1962 but that about it.  Detailed organic and inorganic antigen exposure history in the house is negative.  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  Old building built in 1928.  A new building was built in the 1980s.  She was exposed to old buildings but does not remember any dampness or musty smell or mold..  Detailed organic and inorganic antigen exposure history is negative  PULMONARY TOXICITY HISTORY (27 items):  -.  This is negative.  INVESTIGATIONS: x   CT Chest data - HRCT 09/30/21   Narrative & Impression  CLINICAL DATA:  Pulmonary fibrosis   EXAM: CT CHEST WITHOUT CONTRAST   TECHNIQUE: Multidetector CT imaging of the chest was performed following the standard protocol without intravenous contrast. High  resolution imaging of the lungs, as well as inspiratory and expiratory imaging, was performed.   RADIATION DOSE REDUCTION: This exam was performed according to the departmental dose-optimization program which includes automated exposure control, adjustment of the mA and/or kV according to patient size and/or use of iterative reconstruction technique.   COMPARISON:  CT chest pulmonary angiogram, 09/24/2021, CT chest coronary calcium scoring, 09/11/2019, CT abdomen pelvis, 04/24/2013   FINDINGS: Cardiovascular: Aortic atherosclerosis. Normal heart size. Scattered left coronary artery calcifications. No pericardial effusion.   Mediastinum/Nodes: No enlarged mediastinal, hilar, or axillary lymph nodes. Small hiatal hernia. Thyroid gland, trachea, and esophagus demonstrate no significant findings.   Lungs/Pleura: Mild pulmonary fibrosis in a pattern with apical to basal gradient, featuring irregular peripheral interstitial opacity and ground-glass, as well as traction bronchiectasis at the lung bases, however without clear evidence of subpleural bronchiolectasis or honeycombing. Fibrotic findings appear very slightly worsened over time when compared to the lung bases as included on examination dated 04/24/2013. No significant air trapping on expiratory phase imaging. No pleural effusion or pneumothorax.   Upper Abdomen: No acute abnormality.   Musculoskeletal: No chest wall abnormality. No acute osseous findings.   IMPRESSION: 1. Mild pulmonary fibrosis in a pattern with apical to basal gradient, featuring irregular peripheral interstitial opacity and ground-glass, as well as traction bronchiectasis at the lung bases, however without clear evidence of subpleural bronchiolectasis or honeycombing. Fibrotic findings appear very slightly worsened over time when compared to the lung bases as included on examination dated 04/24/2013. Findings are indeterminate for UIP per  consensus guidelines, differential considerations including both UIP and NSIP: Diagnosis of Idiopathic Pulmonary Fibrosis: An Official ATS/ERS/JRS/ALAT Clinical Practice Guideline. Am Rosezetta Schlatter Crit Care Med Vol 198, Iss 5, [id number], Sep 17 2016. Consider pulmonary referral and interstitial lung disease protocol CT follow-up in 1 year if indicated by clinical suspicion for fibrotic interstitial lung disease. 2. Coronary artery disease. 3. Cholelithiasis.   Aortic Atherosclerosis (ICD10-I70.0).     Electronically Signed   By: Jearld Lesch M.D.   On: 10/01/2021 10:02    No results found.    OV 01/24/2022  Subjective:  Patient ID: Melissa Tapia, female , DOB: 1940/06/25 , age 62 y.o. , MRN: [id number] , ADDRESS: [address] [place] [facility] [address] PCP Rodrigo Ran, MD Patient Care Team: Rodrigo Ran, MD as PCP - General (Internal Medicine)  This Provider for this visit: Treatment Team:  Attending Provider: Kalman Shan, MD    01/24/2022 -   Chief Complaint  Patient presents with   Follow-up    PFT, ILD, feeling better from last visit.     HPI Melissa Tapia 82 y.o. -returns for follow-up.  She continues to be asymptomatic from a respiratory standpoint.  She used to have some paroxysmal nocturnal dyspnea but we got an echocardiogram that showed grade 1 diastolic dysfunction  but otherwise normal.  BNP was normal.  She does not have any dyspnea on exertion there is no cough.  She believes the paroxysmal nocturnal dyspnea was because of anxiety.  She still gets up at night but not because of shortness of breath but more because of dry mouth.  She is taking fish oil.  She is taking Breo because of clinical asthma prescriber primary care physician she believes this is helping her.  She full pulmonary function test and this is essentially normal with a normal FVC and DLCO.  She did have overnight desaturation test and she desaturated for 7 minutes which is very mild.  She did  not want to do oxygen for this.  She has other complaints - She recently has been diagnosed by primary care physician with thrush.  She has tried different treatments.  On my exam today there is no thrush.  She is continuing with those.  She understands Virgel Bouquet is a risk factor for this but she believes Virgel Bouquet is helping her.  We talked about doing Biotene and also flaxseed and she is going to do this   - She has not had RSV vaccine.  She discussed with primary care physician was recommended.  I seconded the recommendation very strongly.    Latest Reference Range & Units 10/21/21 15:37  CK Total 29 - 143 U/L 65  CK, MB 0 - 5.0 ng/mL 0.9  Pro B Natriuretic peptide (BNP) 0.0 - 100.0 pg/mL 21.0  Aldolase < OR = 8.1 U/L 4.8  Sed Rate 0 - 30 mm/hr 1  Anti Nuclear Antibody (ANA) NEGATIVE  NEGATIVE  Angiotensin-Converting Enzyme 9 - 67 U/L 20  Cyclic Citrullin Peptide Ab UNITS <16  ds DNA Ab IU/mL <1  RA Latex Turbid. <14 IU/mL <14  SSA (Ro) (ENA) Antibody, IgG <1.0 NEG AI <1.0 NEG  SSB (La) (ENA) Antibody, IgG <1.0 NEG AI <1.0 NEG  Scleroderma (Scl-70) (ENA) Antibody, IgG <1.0 NEG AI <1.0 NEG    ECHO OCt 2023   IMPRESSIONS     1. Left ventricular ejection fraction, by estimation, is 60 to 65%. Left  ventricular ejection fraction by 3D volume is 65 %. The left ventricle has  normal function. The left ventricle has no regional wall motion  abnormalities. Left ventricular diastolic   parameters are consistent with Grade I diastolic dysfunction (impaired  relaxation).   2. Right ventricular systolic function is normal. The right ventricular  size is normal. There is normal pulmonary artery systolic pressure.   3. The mitral valve is normal in structure. No evidence of mitral valve  regurgitation. No evidence of mitral stenosis.   4. The aortic valve is tricuspid. Aortic valve regurgitation is not  visualized. No aortic stenosis is present.   5. The inferior vena cava is normal in size  with greater than 50%  respiratory variability, suggesting right atrial pressure of 3 mmHg.     OV 06/23/2022  Subjective:  Patient ID: Melissa Tapia, female , DOB: 1940/12/06 , age 82 y.o. , MRN: [id number] , ADDRESS: [address] [place] [address] PCP Rodrigo Ran, MD Patient Care Team: Rodrigo Ran, MD as PCP - General (Internal Medicine)  This Provider for this visit: Treatment Team:  Attending Provider: Kalman Shan, MD  Early ILD/subclinical ILD -indeterminate for UIP September 2023 CT scan; normal pulmonary function test January 2024 June 2024.  Clinical asthma being treated with Breo by primary care physician.  06/23/2022 -   Chief Complaint  Patient  presents with   Follow-up    Review PFT today.  Waking during night with left food discomfort.  Breathing - no issues.  Weight gain, hair loss and finger nail abnormality      HPI Melissa Tapia 82 y.o. -returns for follow-up.  She has mild subclinical ILD for which she is on supportive care and expectant follow-up.  She continues to be on Breo since October 2023 following shortness of breath.  She does not have any shortness of breath or chest pain or wheezing or paroxysmal nocturnal dyspnea orthopnea or cough.  Infection attributes the symptom relief due to [facility].  The diagnose of asthma is clinical.  Is made with the primary care doctor.  She is wondering if she should be on Breo.  We had back-and-forth discussion about this.  I did indicate that she could stop the [facility] and see if her symptoms returned.  If it returns we would give her prednisone to quell the symptoms.  Told her to watch for shortness of breath or cough or wheezing or chest tightness or bronchitis.  We took a shared decision making to stop the [facility] and monitor her.  In terms of ILD her pulmonary function test is stable.  She continues to be asymptomatic.  Her exercise hypoxemia test today was also normal.  Did indicate to her the best plan of action is  for [place] to continue to follow closely and intervene with antifibrotic's if there is progression.  Overall I predict good prognosis for her given her normal PFTs and asymptomatic state.       SYMPTOM SCALE - ILD 10/21/2021 06/23/2022 breo  Current weight    O2 use 0 0  Shortness of Breath 0 -> 5 scale with 5 being worst (score 6 If unable to do)   At rest 00 0  Simple tasks - showers, clothes change, eating, shaving 00 0  Household (dishes, doing bed, laundry) 0 00  Shopping 00 0  Walking level at own pace 0 0  Walking up Stairs 0 0  Total (30-36) Dyspnea Score 0 0      Non-dyspnea symptoms (0-> 5 scale) 10/21/2021 06/23/2022   How bad is your cough? 0 0  How bad is your fatigue 3 0  How bad is nausea 2 0  How bad is vomiting?  0 00  How bad is diarrhea? 0 0  How bad is anxiety? 4 1  How bad is depression 1 1  Any chronic pain - if so where and how bad 0 x   Simple office walk 185 feet x  3 laps goal with forehead probe 10/21/2021  06/23/2022   O2 used ra ra  Number laps completed 3 Sit and stand x 10  Comments about pace avg   Resting Pulse Ox/HR 100% and 85/min 99% and HR 88  Final Pulse Ox/HR 98% and 112/min 97% and HR 100  Desaturated </= 88% no no  Desaturated <= 3% points no no  Got Tachycardic >/= 90/min yes yes  Symptoms at end of test nonex none  Miscellaneous comments x      PFT     Latest Ref Rng & Units 06/23/2022    1:32 PM 01/24/2022    2:07 PM  PFT Results  FVC-Pre L 2.16  P 2.12   FVC-Predicted Pre % 89  P 86   Pre FEV1/FVC % % 92  P 89   FEV1-Pre L 1.99  P 1.89   FEV1-Predicted Pre %  111  P 104   DLCO uncorrected ml/min/mmHg 19.47  P 15.70   DLCO UNC% % 107  P 86   DLCO corrected ml/min/mmHg 19.47  P 15.70   DLCO COR %Predicted % 107  P 86   DLVA Predicted % 118  P 110   TLC L  4.98   TLC % Predicted %  101   RV % Predicted %  90     P Preliminary result       has a past medical history of Hypercholesteremia and Hypertension.   reports  that she has quit smoking. Her smoking use included cigarettes. She has been exposed to tobacco smoke. She has never used smokeless tobacco.  No past surgical history on file.  Allergies  Allergen Reactions   Aspirin Nausea Only   Sulfa Antibiotics Rash    In 1963    Immunization History  Administered Date(s) Administered   Fluad Quad(high Dose 65+) 10/21/2021   Influenza-Unspecified 10/31/2020   PFIZER(Purple Top)SARS-COV-2 Vaccination 02/11/2019, 03/04/2019   Pfizer Covid-19 Vaccine Bivalent Booster 58yrs & up 10/13/2020   Pneumococcal-Unspecified 10/31/2020    No family history on file.   Current Outpatient Medications:    atorvastatin (LIPITOR) 10 MG tablet, Take 10 mg by mouth at bedtime., Disp: , Rfl:    BREO ELLIPTA 100-25 MCG/ACT AEPB, Inhale 1 puff into the lungs daily., Disp: , Rfl:    Calcium-Vitamin D (CALTRATE 600 PLUS-VIT D PO), Caltrate 600 plus D, Disp: , Rfl:    Cholecalciferol 25 MCG (1000 UT) capsule, Take by mouth., Disp: , Rfl:    Cyanocobalamin (B-12 COMPLIANCE INJECTION IJ), Inject as directed., Disp: , Rfl:    cyanocobalamin (VITAMIN B12) 1000 MCG tablet, Take 1 tablet by mouth daily., Disp: , Rfl:    denosumab (PROLIA) 60 MG/ML SOSY injection, first was 8/20 Subcutaneous, Disp: , Rfl:    Flaxseed, Linseed, (FLAX SEEDS) POWD, Take by mouth. Uses small amount on food once a day, Disp: , Rfl:    mirtazapine (REMERON) 15 MG tablet, Take 7.5 mg by mouth at bedtime., Disp: , Rfl:    Multiple Vitamins-Minerals (CENTRUM SILVER 50+WOMEN) TABS, Centrum, Disp: , Rfl:    telmisartan (MICARDIS) 20 MG tablet, 1/2 TABLET ORALLY ONCE A DAY IN THE EVENING 90 DAYS Orally Once a day for 90 days, Disp: , Rfl:    Omega-3 Fatty Acids (FISH OIL) 1200 MG CAPS, Take one cap, po, once a day Oral (Patient not taking: Reported on 06/23/2022), Disp: , Rfl:    omeprazole (PRILOSEC OTC) 20 MG tablet, 1 tablet 30 minutes before morning meal Orally Once a day for 90 days (Patient not  taking: Reported on 06/23/2022), Disp: , Rfl:       Objective:   Vitals:   06/23/22 1427  BP: 128/82  Pulse: 88  Temp: 97.8 F (36.6 C)  TempSrc: Oral  SpO2: 97%  Weight: 151 lb (68.5 kg)  Height: 5\' 3"  (1.6 m)    Estimated body mass index is 26.75 kg/m as calculated from the following:   Height as of this encounter: 5\' 3"  (1.6 m).   Weight as of this encounter: 151 lb (68.5 kg).  @WEIGHTCHANGE @  [employer]   06/23/22 1427  Weight: 151 lb (68.5 kg)     Physical Exam   General: No distress. Loooks well O2 at rest: no Cane present: no Sitting in wheel chair: no Frail: no Obese: no Neuro: Alert and Oriented x 3. GCS 15. Speech normal Psych: Pleasant Resp:  Barrel Chest - no.  Wheeze - no, Crackles - no, No overt respiratory distress CVS: Normal heart sounds. Murmurs - no Ext: Stigmata of Connective Tissue Disease - no HEENT: Normal upper airway. PEERL +. No post nasal drip        Assessment:       ICD-10-CM   1. ILD (interstitial lung disease) (HCC)  J84.9     2. Vaccine counseling  Z71.85     3. History of asthma  Z87.09          Plan:     Patient Instructions   ILD (interstitial lung disease) (HCC)    Subtle mild early ILD since 2021 -> 2023; looks stable. Not sure if was there in 2015 - I doubt it -Clinically this is not consistent with IPF.  Pulmonary function test in January 2024 and June 2024 is normal.    Plan -Supportive expectant approach with serial monitoring -No antifibrotic or biopsy recommended at this point in time -Do spirometry and DLCO in 6 months   Hx of asthma  -This was started in the fall 2023 because of shortness of breath and you seem to have improvement.  The current diagnosis of asthma is clinical.  Glad you are doing well right now.  Plan   -We can stop Breo and monitor your condition. -If your symptoms records and please call us back -   Vaccine counseling  - glad you had rSV vaccine via PCP Rodrigo Ran, MD = seems you had some sort of pneumonia vaccine in oct 2022  Plan -there are 2 types of pneumonia vaccine and because I do not know which exact type you got in 2022 I do not want to give today but I will defer this to the primary care doctor.     Followup  -6 months - 15 min visit; but after spiro/dlco  - walk and ILD symptoms score at followup    SIGNATURE    Dr. Kalman Shan, M.D., F.C.C.P,  Pulmonary and Critical Care Medicine Staff Physician, [facility] Director - Interstitial Lung Disease  Program  Pulmonary Fibrosis [facility] Network at [facility] [place], [place], [address]  Pager: [phone], If no answer or between  15:00h - 7:00h: call [phone] Telephone: [phone]  3:00 PM 06/23/2022

## 2022-06-22 NOTE — Patient Instructions (Signed)
  ILD (interstitial lung disease) (HCC)    Subtle mild early ILD since 2021 -> 2023; looks stable. Not sure if was there in 2015 - I doubt it -Clinically this is not consistent with IPF.  Pulmonary function test in January 2024 and June 2024 is normal.    Plan -Supportive expectant approach with serial monitoring -No antifibrotic or biopsy recommended at this point in time -Do spirometry and DLCO in 6 months   Hx of asthma  -This was started in the fall 2023 because of shortness of breath and you seem to have improvement.  The current diagnosis of asthma is clinical.  Glad you are doing well right now.  Plan   -We can stop Breo and monitor your condition. -If your symptoms records and please call us back -   Vaccine counseling  - glad you had rSV vaccine via PCP Rodrigo Ran, MD = seems you had some sort of pneumonia vaccine in oct 2022  Plan -there are 2 types of pneumonia vaccine and because I do not know which exact type you got in 2022 I do not want to give today but I will defer this to the primary care doctor.     Followup  -6 months - 15 min visit; but after spiro/dlco  - walk and ILD symptoms score at followup

## 2022-06-23 ENCOUNTER — Ambulatory Visit (INDEPENDENT_AMBULATORY_CARE_PROVIDER_SITE_OTHER): Payer: Medicare HMO | Admitting: Internal Medicine

## 2022-06-23 ENCOUNTER — Encounter: Payer: Self-pay | Admitting: Internal Medicine

## 2022-06-23 VITALS — BP 128/82 | HR 88 | Temp 97.8°F | Ht 63.0 in | Wt 151.0 lb

## 2022-06-23 DIAGNOSIS — Z8709 Personal history of other diseases of the respiratory system: Secondary | ICD-10-CM

## 2022-06-23 DIAGNOSIS — J849 Interstitial pulmonary disease, unspecified: Secondary | ICD-10-CM | POA: Diagnosis not present

## 2022-06-23 DIAGNOSIS — Z7185 Encounter for immunization safety counseling: Secondary | ICD-10-CM | POA: Diagnosis not present

## 2022-06-23 LAB — PULMONARY FUNCTION TEST
DL/VA % pred: 118 %
DL/VA: 4.86 ml/min/mmHg/L
DLCO cor % pred: 107 %
DLCO cor: 19.47 ml/min/mmHg
DLCO unc % pred: 107 %
DLCO unc: 19.47 ml/min/mmHg
FEF 25-75 Pre: 3.91 L/sec
FEF2575-%Pred-Pre: 312 %
FEV1-%Pred-Pre: 111 %
FEV1-Pre: 1.99 L
FEV1FVC-%Pred-Pre: 125 %
FEV6-%Pred-Pre: 95 %
FEV6-Pre: 2.16 L
FEV6FVC-%Pred-Pre: 106 %
FVC-%Pred-Pre: 89 %
FVC-Pre: 2.16 L
Pre FEV1/FVC ratio: 92 %
Pre FEV6/FVC Ratio: 100 %

## 2022-06-23 NOTE — Patient Instructions (Signed)
Spiro/DLCO performed today.  

## 2022-06-23 NOTE — Addendum Note (Signed)
Addended byClyda Greener M on: 06/23/2022 03:19 PM   Modules accepted: Orders

## 2022-06-23 NOTE — Progress Notes (Signed)
Spiro/DLCO performed today.  

## 2022-07-20 DIAGNOSIS — Z23 Encounter for immunization: Secondary | ICD-10-CM | POA: Diagnosis not present

## 2022-09-20 DIAGNOSIS — E538 Deficiency of other specified B group vitamins: Secondary | ICD-10-CM | POA: Diagnosis not present

## 2022-09-29 DIAGNOSIS — F325 Major depressive disorder, single episode, in full remission: Secondary | ICD-10-CM | POA: Diagnosis not present

## 2022-09-29 DIAGNOSIS — Z87891 Personal history of nicotine dependence: Secondary | ICD-10-CM | POA: Diagnosis not present

## 2022-09-29 DIAGNOSIS — I10 Essential (primary) hypertension: Secondary | ICD-10-CM | POA: Diagnosis not present

## 2022-09-29 DIAGNOSIS — Z809 Family history of malignant neoplasm, unspecified: Secondary | ICD-10-CM | POA: Diagnosis not present

## 2022-09-29 DIAGNOSIS — E785 Hyperlipidemia, unspecified: Secondary | ICD-10-CM | POA: Diagnosis not present

## 2022-09-29 DIAGNOSIS — J479 Bronchiectasis, uncomplicated: Secondary | ICD-10-CM | POA: Diagnosis not present

## 2022-09-29 DIAGNOSIS — F419 Anxiety disorder, unspecified: Secondary | ICD-10-CM | POA: Diagnosis not present

## 2022-09-29 DIAGNOSIS — R32 Unspecified urinary incontinence: Secondary | ICD-10-CM | POA: Diagnosis not present

## 2022-09-29 DIAGNOSIS — K219 Gastro-esophageal reflux disease without esophagitis: Secondary | ICD-10-CM | POA: Diagnosis not present

## 2022-09-29 DIAGNOSIS — I719 Aortic aneurysm of unspecified site, without rupture: Secondary | ICD-10-CM | POA: Diagnosis not present

## 2022-09-29 DIAGNOSIS — Z8249 Family history of ischemic heart disease and other diseases of the circulatory system: Secondary | ICD-10-CM | POA: Diagnosis not present

## 2022-09-29 DIAGNOSIS — M810 Age-related osteoporosis without current pathological fracture: Secondary | ICD-10-CM | POA: Diagnosis not present

## 2022-10-03 DIAGNOSIS — F4321 Adjustment disorder with depressed mood: Secondary | ICD-10-CM | POA: Diagnosis not present

## 2022-10-03 DIAGNOSIS — M79605 Pain in left leg: Secondary | ICD-10-CM | POA: Diagnosis not present

## 2022-10-03 DIAGNOSIS — I868 Varicose veins of other specified sites: Secondary | ICD-10-CM | POA: Diagnosis not present

## 2022-10-03 DIAGNOSIS — I82813 Embolism and thrombosis of superficial veins of lower extremities, bilateral: Secondary | ICD-10-CM | POA: Diagnosis not present

## 2022-10-13 ENCOUNTER — Ambulatory Visit (HOSPITAL_COMMUNITY)
Admission: RE | Admit: 2022-10-13 | Discharge: 2022-10-13 | Disposition: A | Discharge status: Home / Self Care | Payer: Medicare HMO | Source: Ambulatory Visit | Attending: Vascular Surgery | Admitting: Vascular Surgery

## 2022-10-13 ENCOUNTER — Other Ambulatory Visit (HOSPITAL_COMMUNITY): Payer: Self-pay | Admitting: Adult Health

## 2022-10-13 DIAGNOSIS — I868 Varicose veins of other specified sites: Secondary | ICD-10-CM | POA: Diagnosis not present

## 2022-10-19 DIAGNOSIS — M170 Bilateral primary osteoarthritis of knee: Secondary | ICD-10-CM | POA: Diagnosis not present

## 2022-11-12 DIAGNOSIS — Z23 Encounter for immunization: Secondary | ICD-10-CM | POA: Diagnosis not present

## 2022-11-14 DIAGNOSIS — Z1231 Encounter for screening mammogram for malignant neoplasm of breast: Secondary | ICD-10-CM | POA: Diagnosis not present

## 2022-12-06 DIAGNOSIS — I868 Varicose veins of other specified sites: Secondary | ICD-10-CM | POA: Diagnosis not present

## 2022-12-06 DIAGNOSIS — M150 Primary generalized (osteo)arthritis: Secondary | ICD-10-CM | POA: Diagnosis not present

## 2022-12-06 DIAGNOSIS — M79605 Pain in left leg: Secondary | ICD-10-CM | POA: Diagnosis not present

## 2022-12-06 DIAGNOSIS — K219 Gastro-esophageal reflux disease without esophagitis: Secondary | ICD-10-CM | POA: Diagnosis not present

## 2022-12-06 DIAGNOSIS — F4321 Adjustment disorder with depressed mood: Secondary | ICD-10-CM | POA: Diagnosis not present

## 2022-12-06 DIAGNOSIS — I10 Essential (primary) hypertension: Secondary | ICD-10-CM | POA: Diagnosis not present

## 2022-12-19 DIAGNOSIS — E785 Hyperlipidemia, unspecified: Secondary | ICD-10-CM | POA: Diagnosis not present

## 2022-12-19 DIAGNOSIS — E538 Deficiency of other specified B group vitamins: Secondary | ICD-10-CM | POA: Diagnosis not present

## 2022-12-20 ENCOUNTER — Ambulatory Visit: Payer: Medicare HMO | Admitting: Internal Medicine

## 2022-12-20 ENCOUNTER — Encounter: Payer: Self-pay | Admitting: Internal Medicine

## 2022-12-20 VITALS — BP 146/84 | HR 83 | Ht 63.0 in | Wt 147.0 lb

## 2022-12-20 DIAGNOSIS — J849 Interstitial pulmonary disease, unspecified: Secondary | ICD-10-CM

## 2022-12-20 DIAGNOSIS — Z7185 Encounter for immunization safety counseling: Secondary | ICD-10-CM | POA: Diagnosis not present

## 2022-12-20 NOTE — Progress Notes (Signed)
Spirometry/DLCO performed today. 

## 2022-12-20 NOTE — Progress Notes (Signed)
OV 10/21/2021 to Dr. Marchelle Gearing at the ILD center by Dr. Rodrigo Ran  Subjective:  Patient ID: Melissa Tapia, female , DOB: 1940/04/20 , age 82 y.o. , MRN: [id number] , ADDRESS: [address] [place] [facility] [address] PCP Rodrigo Ran, MD Patient Care Team: Rodrigo Ran, MD as PCP - General (Internal Medicine)  This Provider for this visit: Treatment Team:  Attending Provider: Kalman Shan, MD    10/21/2021 -   Chief Complaint  Patient presents with   Consult    Pt recently had a CT performed which is the reason for today's visit.  Pt states that she does have complaints of SOB that can happen at anytime.     HPI Melissa Tapia 82 y.o. -is a retired Runner, broadcasting/film/video.  She works at [employer] in the schools and [employer].  School was built in [place] but a subsequent building was built in 1980s.  She worked in the old building and then retired at some point.  Her current symptoms are somewhat atypical with the evaluation for ILD.  She says all of a sudden in May 2023 she had phlebitis in the lower extremity and also a Baker's cyst removed but then by the end of July 2023 she had nausea and was treated with Nexium in the emergency room.  Then by September 5 and 6, 2023 she started feeling significantly different and started having shortness of breath.  By September 11 she was started on Advair September 20 this was changed to [facility] and this is actually helping her symptoms.  During all this she ended up with a high-resolution CT chest that showed ILD and therefore she has been referred here.  According to the radiologist the findings worse compared to 2015.  In my personal visualization she has had the findings at least since 2021 but possibly not in 2015.  It appears stable to me as of September 2023.  She actually does not have any chronic shortness of breath or cough.  She does report new onset paroxysmal nocturnal dyspnea more recently not otherwise specified.  She wakes up with  dyspnea also feels paresthesia in her fingers.  This has not been evaluated she is wondering if this because of anxiety.  She says recently her sister came and slept with then she did not have it.  Her husband died 18 months ago and she is living alone and this makes her a little bit nervous.  [facility] Comprehensive ILD Questionnaire  Symptoms:  x Past Medical History :  -She might have asthma per history but otherwise a lot of past medical history is negative   ROS:  She has arthralgia which she thinks is due to age - She has new onset paroxysmal nocturnal dyspnea. - She has dry mouth not otherwise specified. - After her husband died 18 months ago she is lost 8 pounds. - She has had nausea for the last few weeks this is associated with the paroxysmal nocturnal dyspnea.  FAMILY HISTORY of LUNG DISEASE:  -She thinks her dad had pulmonary fibrosis*  PERSONAL EXPOSURE HISTORY:  -She smoked some in college in 1962 but that about it.  Detailed organic and inorganic antigen exposure history in the house is negative.  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  Old building built in 1928.  A new building was built in the 1980s.  She was exposed to old buildings but does not remember any dampness or musty smell or mold..  Detailed organic and inorganic antigen exposure history is negative  PULMONARY TOXICITY HISTORY (27 items):  -.  This is negative.  INVESTIGATIONS: x   CT Chest data - HRCT 09/30/21   Narrative & Impression  CLINICAL DATA:  Pulmonary fibrosis   EXAM: CT CHEST WITHOUT CONTRAST   TECHNIQUE: Multidetector CT imaging of the chest was performed following the standard protocol without intravenous contrast. High  resolution imaging of the lungs, as well as inspiratory and expiratory imaging, was performed.   RADIATION DOSE REDUCTION: This exam was performed according to the departmental dose-optimization program which includes automated exposure control, adjustment of the mA and/or kV according to patient size and/or use of iterative reconstruction technique.   COMPARISON:  CT chest pulmonary angiogram, 09/24/2021, CT chest coronary calcium scoring, 09/11/2019, CT abdomen pelvis, 04/24/2013   FINDINGS: Cardiovascular: Aortic atherosclerosis. Normal heart size. Scattered left coronary artery calcifications. No pericardial effusion.   Mediastinum/Nodes: No enlarged mediastinal, hilar, or axillary lymph nodes. Small hiatal hernia. Thyroid gland, trachea, and esophagus demonstrate no significant findings.   Lungs/Pleura: Mild pulmonary fibrosis in a pattern with apical to basal gradient, featuring irregular peripheral interstitial opacity and ground-glass, as well as traction bronchiectasis at the lung bases, however without clear evidence of subpleural bronchiolectasis or honeycombing. Fibrotic findings appear very slightly worsened over time when compared to the lung bases as included on examination dated 04/24/2013. No significant air trapping on expiratory phase imaging. No pleural effusion or pneumothorax.   Upper Abdomen: No acute abnormality.   Musculoskeletal: No chest wall abnormality. No acute osseous findings.   IMPRESSION: 1. Mild pulmonary fibrosis in a pattern with apical to basal gradient, featuring irregular peripheral interstitial opacity and ground-glass, as well as traction bronchiectasis at the lung bases, however without clear evidence of subpleural bronchiolectasis or honeycombing. Fibrotic findings appear very slightly worsened over time when compared to the lung bases as included on examination dated 04/24/2013. Findings are indeterminate for UIP per  consensus guidelines, differential considerations including both UIP and NSIP: Diagnosis of Idiopathic Pulmonary Fibrosis: An Official ATS/ERS/JRS/ALAT Clinical Practice Guideline. Am Rosezetta Schlatter Crit Care Med Vol 198, Iss 5, [phone], Sep 17 2016. Consider pulmonary referral and interstitial lung disease protocol CT follow-up in 1 year if indicated by clinical suspicion for fibrotic interstitial lung disease. 2. Coronary artery disease. 3. Cholelithiasis.   Aortic Atherosclerosis (ICD10-I70.0).     Electronically Signed   By: Jearld Lesch M.D.   On: 10/01/2021 10:02    No results found.    OV 01/24/2022  Subjective:  Patient ID: Melissa Tapia, female , DOB: 12-23-1940 , age 47 y.o. , MRN: [id number] , ADDRESS: [address] [place] [facility] [address] PCP Rodrigo Ran, MD Patient Care Team: Rodrigo Ran, MD as PCP - General (Internal Medicine)  This Provider for this visit: Treatment Team:  Attending Provider: Kalman Shan, MD    01/24/2022 -   Chief Complaint  Patient presents with   Follow-up    PFT, ILD, feeling better from last visit.     HPI Melissa Tapia 82 y.o. -returns for follow-up.  She continues to be asymptomatic from a respiratory standpoint.  She used to have some paroxysmal nocturnal dyspnea but we got an echocardiogram that showed grade 1  diastolic dysfunction but otherwise normal.  BNP was normal.  She does not have any dyspnea on exertion there is no cough.  She believes the paroxysmal nocturnal dyspnea was because of anxiety.  She still gets up at night but not because of shortness of breath but more because of dry mouth.  She is taking fish oil.  She is taking Breo because of clinical asthma prescriber primary care physician she believes this is helping her.  She full pulmonary function test and this is essentially normal with a normal FVC and DLCO.  She did have overnight desaturation test and she desaturated for 7 minutes which is very mild.  She did  not want to do oxygen for this.  She has other complaints - She recently has been diagnosed by primary care physician with thrush.  She has tried different treatments.  On my exam today there is no thrush.  She is continuing with those.  She understands Virgel Bouquet is a risk factor for this but she believes Virgel Bouquet is helping her.  We talked about doing Biotene and also flaxseed and she is going to do this   - She has not had RSV vaccine.  She discussed with primary care physician was recommended.  I seconded the recommendation very strongly.    Latest Reference Range & Units 10/21/21 15:37  CK Total 29 - 143 U/L 65  CK, MB 0 - 5.0 ng/mL 0.9  Pro B Natriuretic peptide (BNP) 0.0 - 100.0 pg/mL 21.0  Aldolase < OR = 8.1 U/L 4.8  Sed Rate 0 - 30 mm/hr 1  Anti Nuclear Antibody (ANA) NEGATIVE  NEGATIVE  Angiotensin-Converting Enzyme 9 - 67 U/L 20  Cyclic Citrullin Peptide Ab UNITS <16  ds DNA Ab IU/mL <1  RA Latex Turbid. <14 IU/mL <14  SSA (Ro) (ENA) Antibody, IgG <1.0 NEG AI <1.0 NEG  SSB (La) (ENA) Antibody, IgG <1.0 NEG AI <1.0 NEG  Scleroderma (Scl-70) (ENA) Antibody, IgG <1.0 NEG AI <1.0 NEG    ECHO OCt 2023   IMPRESSIONS     1. Left ventricular ejection fraction, by estimation, is 60 to 65%. Left  ventricular ejection fraction by 3D volume is 65 %. The left ventricle has  normal function. The left ventricle has no regional wall motion  abnormalities. Left ventricular diastolic   parameters are consistent with Grade I diastolic dysfunction (impaired  relaxation).   2. Right ventricular systolic function is normal. The right ventricular  size is normal. There is normal pulmonary artery systolic pressure.   3. The mitral valve is normal in structure. No evidence of mitral valve  regurgitation. No evidence of mitral stenosis.   4. The aortic valve is tricuspid. Aortic valve regurgitation is not  visualized. No aortic stenosis is present.   5. The inferior vena cava is normal in size  with greater than 50%  respiratory variability, suggesting right atrial pressure of 3 mmHg.     OV 06/23/2022  Subjective:  Patient ID: Melissa Tapia, female , DOB: October 07, 1940 , age 75 y.o. , MRN: [id number] , ADDRESS: [address] [place] [facility] [id number] PCP Rodrigo Ran, MD Patient Care Team: Rodrigo Ran, MD as PCP - General (Internal Medicine)  This Provider for this visit: Treatment Team:  Attending Provider: Kalman Shan, MD    06/23/2022 -   Chief Complaint  Patient presents with   Follow-up    Review PFT today.  Waking during night with left food discomfort.  Breathing - no issues.  Weight  gain, hair loss and finger nail abnormality      HPI Melissa Tapia 82 y.o. -returns for follow-up.  She has mild subclinical ILD for which she is on supportive care and expectant follow-up.  She continues to be on Breo since October 2023 following shortness of breath.  She does not have any shortness of breath or chest pain or wheezing or paroxysmal nocturnal dyspnea orthopnea or cough.  Infection attributes the symptom relief due to [facility].  The diagnose of asthma is clinical.  Is made with the primary care doctor.  She is wondering if she should be on Breo.  We had back-and-forth discussion about this.  I did indicate that she could stop the [facility] and see if her symptoms returned.  If it returns we would give her prednisone to quell the symptoms.  Told her to watch for shortness of breath or cough or wheezing or chest tightness or bronchitis.  We took a shared decision making to stop the [facility] and monitor her.  In terms of ILD her pulmonary function test is stable.  She continues to be asymptomatic.  Her exercise hypoxemia test today was also normal.  Did indicate to her the best plan of action is for [place] to continue to follow closely and intervene with antifibrotic's if there is progression.  Overall I predict good prognosis for her given her normal PFTs and asymptomatic  state.          OV 12/20/2022  Subjective:  Patient ID: Melissa Tapia, female , DOB: 05-May-1940 , age 58 y.o. , MRN: [id number] , ADDRESS: [address] [place] [address] PCP Rodrigo Ran, MD Patient Care Team: Rodrigo Ran, MD as PCP - General (Internal Medicine)  This Provider for this visit: Treatment Team:  Attending Provider: Kalman Shan, MD   Early ILD/subclinical ILD  -indeterminate for UIP September 2023 CT scan; normal pulmonary function test January 2024 June 2024.  Clinical asthma being treated with Breo by primary care physician.->  Off Breo since summer 2024 and on expectation follow-up.   12/20/2022 -   Chief Complaint  Patient presents with   Follow-up    6 month follow up  finger nails are flatted out, discuss oxygen are going down when sleeping, notice tingle in feet. Discuss hydration levels       HPI Melissa Tapia 82 y.o. -returns for 20-month follow-up.  She is off of Breo and doing well.  Essentially asymptomatic.  She had pulmonary function test and this is still normal.  Her concerns today is that for the last 3 weeks she has had tingling in her feet particularly when she goes to bed.  She has osteoarthritis bone-on-bone on her knee.  And she is wondering if this could be related to ILD.  However rest of pulmonary symptoms are normal and also pulmonary function test is normal.  Therefore I told her this was extremely low odds tingling is got relationship with potential nocturnal hypoxemia.  Therefore she said she will talk to primary care doctor about it.  She is also reporting nasal flattening for the last few months.  She showed me that her nails will become more concave particularly one of her thumb.  Did indicate to her and showed her pictures of clubbing and is the opposite.  Therefore she said she will talk to her primary care doctor.  Clubbing is associated with lung disease and fibrosis but she does not have this.  She is also trying to  stay hydrated and she showed some finger wrinkles.  But there is no Raynaud's there is no other skin thickening there is no scleroderma.  And she said she will talk to the primary care doctor.  In addition she had questions about the COVID mRNA vaccine.  I did indicate to her that the side effect profile well overall safe mimics the disease and is definitely more than a flu shot or RSV vaccine.  She has had COVID mRNA vaccine before without any problems therefore we took a shared decision making that she will continue to have COVID shot this fall.     SYMPTOM SCALE - ILD 10/21/2021 06/23/2022 breo 12/20/2022 Off breo  Current weight     O2 use 0 0   Shortness of Breath 0 -> 5 scale with 5 being worst (score 6 If unable to do)    At rest 00 0 0  Simple tasks - showers, clothes change, eating, shaving 00 0 0  Household (dishes, doing bed, laundry) 0 00 0  Shopping 00 0 0  Walking level at own pace 0 0 0  Walking up Stairs 0 0 0  Total (30-36) Dyspnea Score 0 0 0      Non-dyspnea symptoms (0-> 5 scale) 10/21/2021 06/23/2022  12/20/2022   How bad is your cough? 0 0 0  How bad is your fatigue 3 0 0  How bad is nausea 2 0 0  How bad is vomiting?  0 00 0  How bad is diarrhea? 0 0 0  How bad is anxiety? 4 1 1   How bad is depression 1 1 0  Any chronic pain - if so where and how bad 0 x 0   Simple office walk 185 feet x  3 laps goal with forehead probe 10/21/2021  06/23/2022  12/20/2022   O2 used ra ra ra  Number laps completed 3 Sit and stand x 10  1 laps  Comments about pace avg    Resting Pulse Ox/HR 100% and 85/min 99% and HR 88 97% and HR 64  Final Pulse Ox/HR 98% and 112/min 97% and HR 100 95% and HR 68  Desaturated </= 88% no no   Desaturated <= 3% points no no   Got Tachycardic >/= 90/min yes yes   Symptoms at end of test nonex none   Miscellaneous comments x       PFT     Latest Ref Rng & Units 06/23/2022    1:32 PM 01/24/2022    2:07 PM  PFT Results  FVC-Pre L 2.16  2.12    FVC-Predicted Pre % 89  86   Pre FEV1/FVC % % 92  89   FEV1-Pre L 1.99  1.89   FEV1-Predicted Pre % 111  104   DLCO uncorrected ml/min/mmHg 19.47  15.70   DLCO UNC% % 107  86   DLCO corrected ml/min/mmHg 19.47  15.70   DLCO COR %Predicted % 107  86   DLVA Predicted % 118  110   TLC L  4.98   TLC % Predicted %  101   RV % Predicted %  90       LAB RESULTS last 96 hours No results found.  LAB RESULTS last 90 days No results found for this or any previous visit (from the past 2160 hour(s)).       has a past medical history of Hypercholesteremia and Hypertension.   reports that she has quit smoking. Her smoking use included  cigarettes. She has been exposed to tobacco smoke. She has never used smokeless tobacco.  History reviewed. No pertinent surgical history.  Allergies  Allergen Reactions   Aspirin Nausea Only   Sulfa Antibiotics Rash    In 1963    Immunization History  Administered Date(s) Administered   Fluad Quad(high Dose 65+) 10/21/2021   Influenza-Unspecified 10/31/2020   PFIZER(Purple Top)SARS-COV-2 Vaccination 02/11/2019, 03/04/2019   Pfizer Covid-19 Vaccine Bivalent Booster 66yrs & up 10/13/2020   Pneumococcal-Unspecified 10/31/2020   RSV,unspecified 01/31/2022    History reviewed. No pertinent family history.   Current Outpatient Medications:    atorvastatin (LIPITOR) 10 MG tablet, Take 10 mg by mouth at bedtime., Disp: , Rfl:    Calcium-Vitamin D (CALTRATE 600 PLUS-VIT D PO), Caltrate 600 plus D, Disp: , Rfl:    Cholecalciferol 25 MCG (1000 UT) capsule, Take by mouth., Disp: , Rfl:    Cyanocobalamin (B-12 COMPLIANCE INJECTION IJ), Inject as directed., Disp: , Rfl:    cyanocobalamin (VITAMIN B12) 1000 MCG tablet, Take 1 tablet by mouth daily., Disp: , Rfl:    denosumab (PROLIA) 60 MG/ML SOSY injection, first was 8/20 Subcutaneous, Disp: , Rfl:    Multiple Vitamins-Minerals (CENTRUM SILVER 50+WOMEN) TABS, Centrum, Disp: , Rfl:    Omega-3 Fatty  Acids (FISH OIL) 1200 MG CAPS, , Disp: , Rfl:    telmisartan (MICARDIS) 20 MG tablet, 1/2 TABLET ORALLY ONCE A DAY IN THE EVENING 90 DAYS Orally Once a day for 90 days, Disp: , Rfl:    BREO ELLIPTA 100-25 MCG/ACT AEPB, Inhale 1 puff into the lungs daily. (Patient not taking: Reported on 12/20/2022), Disp: , Rfl:    Flaxseed, Linseed, (FLAX SEEDS) POWD, Take by mouth. Uses small amount on food once a day (Patient not taking: Reported on 12/20/2022), Disp: , Rfl:    mirtazapine (REMERON) 15 MG tablet, Take 7.5 mg by mouth at bedtime. (Patient not taking: Reported on 12/20/2022), Disp: , Rfl:    omeprazole (PRILOSEC OTC) 20 MG tablet, 1 tablet 30 minutes before morning meal Orally Once a day for 90 days (Patient not taking: Reported on 06/23/2022), Disp: , Rfl:       Objective:   Vitals:   12/20/22 1038  BP: (!) 146/84  Pulse: 83  SpO2: 98%  Weight: 147 lb (66.7 kg)  Height: 5\' 3"  (1.6 m)    Estimated body mass index is 26.04 kg/m as calculated from the following:   Height as of this encounter: 5\' 3"  (1.6 m).   Weight as of this encounter: 147 lb (66.7 kg).  @WEIGHTCHANGE @  Filed Weights   12/20/22 1038  Weight: 147 lb (66.7 kg)     Physical Exam   General: No distress. Looks well O2 at rest: no Cane present: no Sitting in wheel chair: no Frail: no Obese: no Neuro: Alert and Oriented x 3. GCS 15. Speech normal Psych: Pleasant Resp:  Barrel Chest - no.  Wheeze - no, Crackles - no, No overt respiratory distress CVS: Normal heart sounds. Murmurs - no Ext: Stigmata of Connective Tissue Disease - no HEENT: Normal upper airway. PEERL +. No post nasal drip        Assessment:       ICD-10-CM   1. ILD (interstitial lung disease) (HCC)  J84.9     2. Vaccine counseling  Z71.85          Plan:     Patient Instructions   ILD (interstitial lung disease) (HCC)    Subtle mild early ILD  since 2021 -> 2023; looks stable. Not sure if was there in 2015 - I doubt  it -Clinically this is not consistent with IPF.  Pulmonary function test in January 2024 and June 2024 and December 2024 is normal.    Plan -Supportive expectant approach with serial monitoring -No antifibrotic or biopsy recommended at this point in time -Do high-resolution CT chest in 6-7 months [last 17 September 2021]   Hx of asthma  -This was started in the fall 2023 because of shortness of breath and you seem to have improvement.  Diagnosis of asthma was clinical and glad you are doing well without the inhaler   plan   Monitor without inhaler  Vaccine counseling  - discussed covid mRNA vaccine safetry profile extensively -with the understanding that it is beneficial but the side effect profile appears higher than the flu shot  Plan - Shared decision making that you will take the COVID mRNA vaccine     Followup  -6 months - 15 min visit; but after CT chest [give 3 weeks after CT chest for follow-up appointment]  - walk and ILD symptoms score at followup   FOLLOWUP Return in about 8 months (around 08/20/2023) for 15 min visit, after HRCT chest, ILD, with Dr Marchelle Gearing.  (Level 04 E&M 2024: Estb >= 30 min visit type: on-site physical face to visit visit spent in total care time and counseling or/and coordination of care by this undersigned MD - Dr Kalman Shan. This includes one or more of the following on this same day 12/20/2022: pre-charting, chart review, note writing, documentation discussion of test results, diagnostic or treatment recommendations, prognosis, risks and benefits of management options, instructions, education, compliance or risk-factor reduction. It excludes time spent by the CMA or office staff in the care of the patient . Actual time is 31 min)   SIGNATURE    Dr. Kalman Shan, M.D., F.C.C.P,  Pulmonary and Critical Care Medicine Staff Physician, [facility] Center Director - Interstitial Lung Disease  Program  Pulmonary Fibrosis  [facility] at [facility] [place], [place], [address]  Pager: [phone], If no answer or between  15:00h - 7:00h: call [phone] Telephone: [phone]  11:29 AM 12/20/2022   Moderate Complexity MDM OFFICE  2021 E/M guidelines, first released in 2021, with minor revisions added in 2023 and 2024 Must meet the requirements for 2 out of 3 dimensions to qualify.    Number and complexity of problems addressed Amount and/or complexity of data reviewed Risk of complications and/or morbidity  One or more chronic illness with mild exacerbation, OR progression, OR  side effects of treatment  Two or more stable chronic illnesses  One undiagnosed new problem with uncertain prognosis  One acute illness with systemic symptoms   One Acute complicated injury Must meet the requirements for 1 of 3 of the categories)  Category 1: Tests and documents, historian  Any combination of 3 of the following:  Assessment requiring an independent historian  Review of prior external note(s) from each unique source  Review of results of each unique test  Ordering of each unique test    Category 2: Interpretation of tests   Independent interpretation of a test performed by another physician/other qualified health care professional (not separately reported)  Category 3: Discuss management/tests  Discussion of management or test interpretation with external physician/other qualified health care professional/appropriate source (not separately reported) Moderate risk of morbidity from additional diagnostic testing  or treatment Examples only:  Prescription drug management  Decision regarding minor surgery with identfied patient or procedure risk factors  Decision regarding elective major surgery without identified patient or procedure risk factors  Diagnosis or treatment significantly limited by social determinants of health             HIGh Complexity   OFFICE   2021 E/M guidelines, first released in 2021, with minor revisions added in 2023. Must meet the requirements for 2 out of 3 dimensions to qualify.    Number and complexity of problems addressed Amount and/or complexity of data reviewed Risk of complications and/or morbidity  Severe exacerbation of chronic illness  Acute or chronic illnesses that may pose a threat to life or bodily function, e.g., multiple trauma, acute MI, pulmonary embolus, severe respiratory distress, progressive rheumatoid arthritis, psychiatric illness with potential threat to self or others, peritonitis, acute renal failure, abrupt change in neurological status Must meet the requirements for 2 of 3 of the categories)  Category 1: Tests and documents, historian  Any combination of 3 of the following:  Assessment requiring an independent historian  Review of prior external note(s) from each unique source  Review of results of each unique test  Ordering of each unique test    Category 2: Interpretation of tests    Independent interpretation of a test performed by another physician/other qualified health care professional (not separately reported)  Category 3: Discuss management/tests  Discussion of management or test interpretation with external physician/other qualified health care professional/appropriate source (not separately reported)  HIGH risk of morbidity from additional diagnostic testing or treatment Examples only:  Drug therapy requiring intensive monitoring for toxicity  Decision for elective major surgery with identified pateint or procedure risk factors  Decision regarding hospitalization or escalation of level of care  Decision for DNR or to de-escalate care   Parenteral controlled  substances            LEGEND - Independent interpretation involves the interpretation of a test for which there is a CPT code, and an interpretation or report is customary. When a review  and interpretation of a test is performed and documented by the provider, but not separately reported (billed), then this would represent an independent interpretation. This report does not need to conform to the usual standards of a complete report of the test. This does not include interpretation of tests that do not have formal reports such as a complete blood count with differential and blood cultures. Examples would include reviewing a chest radiograph and documenting in the medical record an interpretation, but not separately reporting (billing) the interpretation of the chest radiograph.   An appropriate source includes professionals who are not health care professionals but may be involved in the management of the patient, such as a Clinical research associate, upper officer, case manager or teacher, and does not include discussion with family or informal caregivers.    - SDOH: SDOH are the conditions in the environments where people are born, live, learn, work, play, worship, and age that affect a wide range of health, functioning, and quality-of-life outcomes and risks. (e.g., housing, food insecurity, transportation, etc.). SDOH-related Z codes ranging from Z55-Z65 are the ICD-10-CM diagnosis codes used to document SDOH data Z55 - Problems related to education and literacy Z56 - Problems related to employment and unemployment Z57 - Occupational exposure to risk factors Z58 - Problems related to physical environment Z59 - Problems related to housing and economic circumstances [phone] - Problems related to  social environment [phone] - Problems related to upbringing Z63 - Other problems related to primary support group, including family circumstances Z44 - Problems related to certain psychosocial circumstances Z65 - Problems related to other psychosocial circumstances

## 2022-12-20 NOTE — Patient Instructions (Signed)
Spirometry/DLCO performed today. 

## 2022-12-20 NOTE — Patient Instructions (Addendum)
  ILD (interstitial lung disease) (HCC)    Subtle mild early ILD since 2021 -> 2023; looks stable. Not sure if was there in 2015 - I doubt it -Clinically this is not consistent with IPF.  Pulmonary function test in January 2024 and June 2024 and December 2024 is normal.    Plan -Supportive expectant approach with serial monitoring -No antifibrotic or biopsy recommended at this point in time -Do high-resolution CT chest in 6-7 months [last 17 September 2021]   Hx of asthma  -This was started in the fall 2023 because of shortness of breath and you seem to have improvement.  Diagnosis of asthma was clinical and glad you are doing well without the inhaler   plan   Monitor without inhaler  Vaccine counseling  - discussed covid mRNA vaccine safetry profile extensively -with the understanding that it is beneficial but the side effect profile appears higher than the flu shot  Plan - Shared decision making that you will take the COVID mRNA vaccine     Followup  -6 months - 15 min visit; but after CT chest [give 3 weeks after CT chest for follow-up appointment]  - walk and ILD symptoms score at followup

## 2022-12-22 LAB — PULMONARY FUNCTION TEST
DL/VA % pred: 110 %
DL/VA: 4.5 ml/min/mmHg/L
DLCO cor % pred: 92 %
DLCO cor: 16.74 ml/min/mmHg
DLCO unc % pred: 92 %
DLCO unc: 16.74 ml/min/mmHg
FEF 25-75 Pre: 3.55 L/s
FEF2575-%Pred-Pre: 283 %
FEV1-%Pred-Pre: 112 %
FEV1-Pre: 2.01 L
FEV1FVC-%Pred-Pre: 120 %
FEV6-%Pred-Pre: 99 %
FEV6-Pre: 2.26 L
FEV6FVC-%Pred-Pre: 106 %
FVC-%Pred-Pre: 94 %
FVC-Pre: 2.26 L
Pre FEV1/FVC ratio: 89 %
Pre FEV6/FVC Ratio: 100 %

## 2022-12-26 DIAGNOSIS — I868 Varicose veins of other specified sites: Secondary | ICD-10-CM | POA: Diagnosis not present

## 2022-12-26 DIAGNOSIS — M810 Age-related osteoporosis without current pathological fracture: Secondary | ICD-10-CM | POA: Diagnosis not present

## 2022-12-26 DIAGNOSIS — E039 Hypothyroidism, unspecified: Secondary | ICD-10-CM | POA: Diagnosis not present

## 2022-12-26 DIAGNOSIS — R7301 Impaired fasting glucose: Secondary | ICD-10-CM | POA: Diagnosis not present

## 2022-12-26 DIAGNOSIS — Z0000 Encounter for general adult medical examination without abnormal findings: Secondary | ICD-10-CM | POA: Diagnosis not present

## 2022-12-26 DIAGNOSIS — I700 Atherosclerosis of aorta: Secondary | ICD-10-CM | POA: Diagnosis not present

## 2022-12-26 DIAGNOSIS — R82998 Other abnormal findings in urine: Secondary | ICD-10-CM | POA: Diagnosis not present

## 2022-12-26 DIAGNOSIS — H9190 Unspecified hearing loss, unspecified ear: Secondary | ICD-10-CM | POA: Diagnosis not present

## 2022-12-26 DIAGNOSIS — Z1331 Encounter for screening for depression: Secondary | ICD-10-CM | POA: Diagnosis not present

## 2022-12-26 DIAGNOSIS — K219 Gastro-esophageal reflux disease without esophagitis: Secondary | ICD-10-CM | POA: Diagnosis not present

## 2022-12-26 DIAGNOSIS — E785 Hyperlipidemia, unspecified: Secondary | ICD-10-CM | POA: Diagnosis not present

## 2022-12-26 DIAGNOSIS — E538 Deficiency of other specified B group vitamins: Secondary | ICD-10-CM | POA: Diagnosis not present

## 2022-12-26 DIAGNOSIS — G629 Polyneuropathy, unspecified: Secondary | ICD-10-CM | POA: Diagnosis not present

## 2022-12-26 DIAGNOSIS — J8410 Pulmonary fibrosis, unspecified: Secondary | ICD-10-CM | POA: Diagnosis not present

## 2022-12-27 ENCOUNTER — Encounter: Payer: Self-pay | Admitting: Neurology

## 2022-12-27 ENCOUNTER — Other Ambulatory Visit (HOSPITAL_COMMUNITY): Payer: Self-pay | Admitting: *Deleted

## 2022-12-28 ENCOUNTER — Encounter (HOSPITAL_COMMUNITY): Payer: Medicare HMO

## 2022-12-29 ENCOUNTER — Ambulatory Visit (HOSPITAL_COMMUNITY)
Admission: RE | Admit: 2022-12-29 | Discharge: 2022-12-29 | Disposition: A | Discharge status: Home / Self Care | Payer: Medicare HMO | Source: Ambulatory Visit | Attending: Internal Medicine | Admitting: Internal Medicine

## 2022-12-29 DIAGNOSIS — M810 Age-related osteoporosis without current pathological fracture: Secondary | ICD-10-CM | POA: Insufficient documentation

## 2022-12-29 MED ORDER — DENOSUMAB 60 MG/ML ~~LOC~~ SOSY
PREFILLED_SYRINGE | SUBCUTANEOUS | Status: AC
  Filled 2022-12-29: qty 1

## 2022-12-29 MED ORDER — DENOSUMAB 60 MG/ML ~~LOC~~ SOSY
60.0000 mg | PREFILLED_SYRINGE | Freq: Once | SUBCUTANEOUS | Status: AC
  Administered 2022-12-29: 60 mg via SUBCUTANEOUS

## 2022-12-30 ENCOUNTER — Encounter (HOSPITAL_COMMUNITY): Payer: Medicare HMO

## 2023-01-09 DIAGNOSIS — R200 Anesthesia of skin: Secondary | ICD-10-CM | POA: Diagnosis not present

## 2023-01-24 ENCOUNTER — Ambulatory Visit (INDEPENDENT_AMBULATORY_CARE_PROVIDER_SITE_OTHER): Payer: Medicare HMO | Admitting: Neurology

## 2023-01-24 ENCOUNTER — Encounter: Payer: Self-pay | Admitting: Neurology

## 2023-01-24 VITALS — BP 150/82 | HR 82 | Ht 63.0 in | Wt 147.0 lb

## 2023-01-24 DIAGNOSIS — M5417 Radiculopathy, lumbosacral region: Secondary | ICD-10-CM | POA: Diagnosis not present

## 2023-01-24 MED ORDER — GABAPENTIN 100 MG PO CAPS: ORAL_CAPSULE | ORAL | 3 refills | Status: DC

## 2023-01-24 NOTE — Patient Instructions (Signed)
 Start gabapentin 100mg  at bedtime for one week, then increase to 200mg  at bedtime.  We will refer you to start physical therapy for low back strengthening

## 2023-01-24 NOTE — Progress Notes (Signed)
 [facility] Neurology [facility] Clinic Note - Initial Visit   Date: 01/24/2023   [name] MRN: [id number] DOB: 1940/11/29   Dear Dr. Shayne:  Thank you for your kind referral of Melissa Tapia for consultation of neuropathy. Although her history is well known to you, please allow us  to reiterate it for the purpose of our medical record. The patient was accompanied to the clinic by self.    Melissa Tapia is a delightful 83 y.o. left-handed female with interstitial lung disease, hypertension, and hyperlipidemia presenting for evaluation of bilateral feet pain.   IMPRESSION/PLAN: Bilateral feet paresthesias and pain with associated lumbar pain.  Her exam shows intact distal sensation with increased patella jerks and reduced S1 reflexes. These findings may suggest that she has lumbosacral degenerative changes and S1 radiculopathy may explain her feet paresthesias. I recommend PT for low back strengthening and trial of gabapentin  100mg  at bedtime x 1 week, then increase to 200mg  at bedtime.  I discussed NCS/EMG to further evaluate her symptoms, which was mutually decided to consider only if her symptoms do not improve with PT. With her intact distal sensation and intermittent symptoms, neuropathy is less likely.  She has claustrophobia and does not want to have imaging at this time.   Return to clinic in 3-4 months, or sooner as needed  ------------------------------------------------------------- History of present illness: Starting around October/November 2024, she began having burning sensation involving the soles of the feet.  She has no problems falling asleep, but tends to wake up several hours later because of burning.  She applies an ice back which provides relief and allows her to falls asleep again.  She denies the urge to move the legs or having to walk around to get relief.  She does not have daytime numbness/tingling in the feet.  She denies imbalance, falls, or  weakness.   In 2021, she was having right sciatica and saw a chiropractor which helped her pain.  Over the past 2-3 weeks, she has been waking up with throbbing pain involving the buttocks on both sides.  She gets relief with ice. No radicular leg pain.   Nonsmoker.  She does not drink alcohol .  Retired [employer].  Out-side paper records, electronic medical record, and images have been reviewed where available and summarized as:  Labs 12/19/2022:  TSH 5.49, free T4 0.8, vitamin D 51.9, vitamin B12 674,    Past Medical History:  Diagnosis Date   Hypercholesteremia    Hypertension     History reviewed. No pertinent surgical history.   Medications:  Outpatient Encounter Medications as of 01/24/2023  Medication Sig   atorvastatin  (LIPITOR) 10 MG tablet Take 10 mg by mouth at bedtime.   Calcium -Vitamin D (CALTRATE 600 PLUS-VIT D PO) Caltrate 600 plus D   Cholecalciferol 25 MCG (1000 UT) capsule Take by mouth.   Cyanocobalamin  (B-12 COMPLIANCE INJECTION IJ) Inject as directed. Inject every 3 months   cyanocobalamin  (VITAMIN B12) 1000 MCG tablet Take 1 tablet by mouth daily.   denosumab  (PROLIA ) 60 MG/ML SOSY injection first was 8/20 Subcutaneous   docusate sodium (COLACE) 50 MG capsule Take 50 mg by mouth 2 (two) times daily.   Multiple Vitamins-Minerals (CENTRUM SILVER 50+WOMEN) TABS Centrum   Omega-3 Fatty Acids (FISH OIL) 1200 MG CAPS    telmisartan (MICARDIS) 20 MG tablet 1/2 TABLET ORALLY ONCE A DAY IN THE EVENING 90 DAYS Orally Once a day for 90 days (Patient taking differently: One tablet daily)   [DISCONTINUED]  BREO ELLIPTA  100-25 MCG/ACT AEPB Inhale 1 puff into the lungs daily. (Patient not taking: Reported on 01/24/2023)   [DISCONTINUED] Flaxseed, Linseed, (FLAX SEEDS) POWD Take by mouth. Uses small amount on food once a day (Patient not taking: Reported on 01/24/2023)   [DISCONTINUED] mirtazapine (REMERON) 15 MG tablet Take 7.5 mg by mouth at bedtime. (Patient not taking:  Reported on 01/24/2023)   [DISCONTINUED] omeprazole (PRILOSEC OTC) 20 MG tablet 1 tablet 30 minutes before morning meal Orally Once a day for 90 days (Patient not taking: Reported on 01/24/2023)   No facility-administered encounter medications on file as of 01/24/2023.    Allergies:  Allergies  Allergen Reactions   Aspirin Nausea Only   Sulfa Antibiotics Rash    In 1963    Family History: Family History  Problem Relation Age of Onset   Leukemia Mother    Hypertension Mother    Colon cancer Father     Social History: Social History   Tobacco Use   Smoking status: Former    Types: Cigarettes    Passive exposure: Past   Smokeless tobacco: Never   Tobacco comments:    Smoked some in college socially.  Vaping Use   Vaping status: Never Used  Substance Use Topics   Alcohol  use: Never   Drug use: Never   Social History   Social History Narrative   Are you right handed or left handed? Left Handed    Are you currently employed ? No    What is your current occupation?    Do you live at home alone? Yes   Who lives with you? Husband passed away 3 years ago.   What type of home do you live in: 1 story or 2 story? Lives in a two story home and her bedroom is on the first floor.                  Vital Signs:  BP (!) 150/82   Pulse 82   Ht 5' 3 (1.6 m)   Wt 147 lb (66.7 kg)   SpO2 99%   BMI 26.04 kg/m    Neurological Exam: MENTAL STATUS including orientation to time, place, person, recent and remote memory, attention span and concentration, language, and fund of knowledge is normal.  Speech is not dysarthric.  CRANIAL NERVES: II:  No visual field defects.     III-IV-VI: Pupils equal round and reactive to light.  Normal conjugate, extra-ocular eye movements in all directions of gaze.  No nystagmus.  No ptosis.   V:  Normal facial sensation.    VII:  Normal facial symmetry and movements.   VIII:  Normal hearing and vestibular function.   IX-X:  Normal palatal movement.    XI:  Normal shoulder shrug and head rotation.   XII:  Normal tongue strength and range of motion, no deviation or fasciculation.  MOTOR:  No atrophy or fasciculations.  Mild chin tremor at rest.   No pronator drift.   Upper Extremity:  Right  Left  Deltoid  5/5   5/5   Biceps  5/5   5/5   Triceps  5/5   5/5   Wrist extensors  5/5   5/5   Wrist flexors  5/5   5/5   Finger extensors  5/5   5/5   Finger flexors  5/5   5/5   Dorsal interossei  5/5   5/5   Abductor pollicis  5/5   5/5   Tone (Ashworth  scale)  0  0   Lower Extremity:  Right  Left  Hip flexors  5/5   5/5   Hip extensors  5/5   5/5   Knee flexors  5/5   5/5   Knee extensors  5/5   5/5   Dorsiflexors  5/5   5/5   Plantarflexors  5/5   5/5   Toe extensors  5/5   5/5   Toe flexors  5-/5   5-/5   Tone (Ashworth scale)  0  0   MSRs:                                           Right        Left brachioradialis 2+  2+  biceps 2+  2+  triceps 2+  2+  patellar 3+  3+  ankle jerk 1+  1+  Hoffman no  no  plantar response down  down   SENSORY:  Normal and symmetric perception of light touch, pinprick, vibration, and temperature.  Romberg's sign absent.   COORDINATION/GAIT: Normal finger-to- nose-finger.  Intact rapid alternating movements bilaterally.  Gait narrow based and stable, mildly stooped posture. Tandem and stressed gait intact.     Thank you for allowing me to participate in patient's care.  If I can answer any additional questions, I would be pleased to do so.    Sincerely,    Antoinette Borgwardt K. Tobie, DO

## 2023-01-26 DIAGNOSIS — H52223 Regular astigmatism, bilateral: Secondary | ICD-10-CM | POA: Diagnosis not present

## 2023-02-13 ENCOUNTER — Ambulatory Visit: Payer: Medicare HMO | Admitting: Neurology

## 2023-02-14 ENCOUNTER — Other Ambulatory Visit: Payer: Self-pay

## 2023-02-14 ENCOUNTER — Encounter (HOSPITAL_BASED_OUTPATIENT_CLINIC_OR_DEPARTMENT_OTHER): Payer: Self-pay | Admitting: Physical Therapy

## 2023-02-14 ENCOUNTER — Ambulatory Visit (HOSPITAL_BASED_OUTPATIENT_CLINIC_OR_DEPARTMENT_OTHER): Payer: Medicare HMO | Attending: Neurology | Admitting: Physical Therapy

## 2023-02-14 DIAGNOSIS — M6281 Muscle weakness (generalized): Secondary | ICD-10-CM | POA: Insufficient documentation

## 2023-02-14 DIAGNOSIS — M5417 Radiculopathy, lumbosacral region: Secondary | ICD-10-CM | POA: Insufficient documentation

## 2023-02-14 DIAGNOSIS — R29898 Other symptoms and signs involving the musculoskeletal system: Secondary | ICD-10-CM | POA: Diagnosis not present

## 2023-02-14 DIAGNOSIS — M5459 Other low back pain: Secondary | ICD-10-CM | POA: Insufficient documentation

## 2023-02-14 DIAGNOSIS — R2689 Other abnormalities of gait and mobility: Secondary | ICD-10-CM | POA: Diagnosis not present

## 2023-02-14 NOTE — Therapy (Signed)
OUTPATIENT PHYSICAL THERAPY THORACOLUMBAR EVALUATION   Patient Name: Melissa Tapia MRN: [id number] DOB:04/05/1940, 83 y.o., female Today's Date: 02/14/2023  END OF SESSION:  PT End of Session - 02/14/23 1150     Visit Number 1    Number of Visits 16    Date for PT Re-Evaluation 04/11/23    Authorization Type [employer]    PT Start Time 1150    PT Stop Time 1237    PT Time Calculation (min) 47 min    Activity Tolerance Patient tolerated treatment well    Behavior During Therapy WFL for tasks assessed/performed             Past Medical History:  Diagnosis Date   Hypercholesteremia    Hypertension    History reviewed. No pertinent surgical history. There are no active problems to display for this patient.   PCP: Rodrigo Ran MD  REFERRING PROVIDER: Glendale Chard, DO  REFERRING DIAG: [phone] (ICD-10-CM) - Lumbosacral radiculopathy  Rationale for Evaluation and Treatment: Rehabilitation  THERAPY DIAG:  Other low back pain  Muscle weakness (generalized)  Other abnormalities of gait and mobility  Other symptoms and signs involving the musculoskeletal system  ONSET DATE: October/November 2024  SUBJECTIVE:                                                                                                                                                                                           SUBJECTIVE STATEMENT: Patient states some numbness in her feet. In 2021/2022, her sciatic nerve was bothering her and she went to a chiropractor which seemed to help. Has not had any issues since until November 2024. Back bothers her with lifting and was with decorating at the holidays. During the day it does no bother her unless lifting, vacuuming. At night, she can sleep several hours without issues but it bothers her after at 4-5 hours, she then ices it and it seems to help but then notes numbness in feet. Tries walking a bit and that helps which allows her to return to sleep.  Then wakes up early morning with same situation. Numbness bilaterally at night with L>R. Sleeps on sides, mostly on L but wakes up on R.   PERTINENT HISTORY:  HTN, HLD  PAIN:  Are you having pain? No  PRECAUTIONS: None  WEIGHT BEARING RESTRICTIONS: No  FALLS:  Has patient fallen in last 6 months? No  PLOF: Independent  PATIENT GOALS: sleep through the night and no numbness in her feet   OBJECTIVE: (objective measures from initial evaluation unless otherwise dated)  PATIENT SURVEYS:  Modified Oswestry 4%   SCREENING FOR  RED FLAGS: Bowel or bladder incontinence: No Spinal tumors: No Cauda equina syndrome: No Compression fracture: No Abdominal aneurysm: No  COGNITION: Overall cognitive status: Within functional limits for tasks assessed     SENSATION: WFL   POSTURE: rounded shoulders and forward head  PALPATION: TTP L piriformis  LUMBAR ROM:   AROM eval  Flexion 0% limited  Extension 25% limited  Right lateral flexion 0% limited  Left lateral flexion 0% limited  Right rotation   Left rotation    (Blank rows = not tested) * = pain/symptoms  LOWER EXTREMITY ROM:   bilateral hip extension limited, slightly crouched in standing  Active  Right eval Left eval  Hip flexion    Hip extension    Hip abduction    Hip adduction    Hip internal rotation    Hip external rotation    Knee flexion    Knee extension    Ankle dorsiflexion    Ankle plantarflexion    Ankle inversion    Ankle eversion     (Blank rows = not tested) * = pain/symptoms  LOWER EXTREMITY MMT:    MMT Right eval Left eval  Hip flexion 5 5  Hip extension 3+ 3+  Hip abduction 4+ 4+  Hip adduction    Hip internal rotation    Hip external rotation    Knee flexion 5 5  Knee extension 5 5  Ankle dorsiflexion    Ankle plantarflexion    Ankle inversion    Ankle eversion     (Blank rows = not tested) * = pain/symptoms   FUNCTIONAL TESTS:  5 times sit to stand: 19.31 seconds  without UE support, dynamic knee vaglus bilaterally Transfer STS: bilateral dynamic knee valgus, slightly crouched position in standing.   GAIT: Distance walked: 100 feet Assistive device utilized: None Level of assistance: Complete Independence Comments: slightly crouched gait.   TODAY'S TREATMENT:                                                                                                                              DATE:  02/14/23  Eval and HEP   PATIENT EDUCATION:  Education details: Patient educated on exam findings, POC, scope of PT, HEP Person educated: Patient Education method: Programmer, multimedia, Demonstration, and Handouts Education comprehension: verbalized understanding, returned demonstration, verbal cues required, and tactile cues required  HOME EXERCISE PROGRAM: Access Code: [email or web address] URL: [email or web address]  ASSESSMENT:  CLINICAL IMPRESSION: Patient a 83 y.o. y.o. female who was seen today for physical therapy evaluation and treatment for lumbosacral radiculopathy. Patient presents with pain limited deficits in lumbar spine and LE strength, ROM, endurance, activity tolerance, and functional mobility with ADL. Patient is having to modify and restrict ADL as indicated by outcome measure score as well as subjective information and objective measures which is affecting overall participation. Patient will benefit from skilled physical therapy in order to improve function and reduce impairment.  OBJECTIVE IMPAIRMENTS: decreased activity tolerance,  decreased endurance, decreased mobility, decreased ROM, decreased strength, increased muscle spasms, impaired flexibility, impaired sensation, improper body mechanics, postural dysfunction, and pain.   ACTIVITY LIMITATIONS: carrying, lifting, bending, standing, squatting, stairs, transfers, bed mobility, locomotion level, and caring for others  PARTICIPATION LIMITATIONS: meal prep, cleaning, laundry, shopping,  community activity, yard work, and sleeping  PERSONAL FACTORS: Age, Time since onset of injury/illness/exacerbation, and 1-2 comorbidities: HTN, HLD  are also affecting patient's functional outcome.   REHAB POTENTIAL: Good  CLINICAL DECISION MAKING: Stable/uncomplicated  EVALUATION COMPLEXITY: Low   GOALS: Goals reviewed with patient? Yes  SHORT TERM GOALS: Target date: 03/14/2023    Patient will be independent with HEP in order to improve functional outcomes. Baseline:  Goal status: INITIAL  2.  Patient will report at least 25% improvement in symptoms for improved quality of life. Baseline:  Goal status: INITIAL    LONG TERM GOALS: Target date: 04/11/2023    Patient will report at least 75% improvement in symptoms for improved quality of life. Baseline:  Goal status: INITIAL  2.  Patient will improve modified Oswestry to less than 2% in order to indicate improved tolerance to activity. Baseline:  Goal status: INITIAL  3.  Patient will demonstrate grade of 5/5 MMT grade in all tested musculature as evidence of improved strength to assist with stair ambulation and gait.   Baseline:  Goal status: INITIAL  4.  Patient will be able to complete 5x STS in under 11.4 seconds in order to demonstrate improved functional strength. Baseline:  Goal status: INITIAL  5.  Patient will report improved sleep for improved quality of life.  Baseline:  Goal status: INITIAL    PLAN:  PT FREQUENCY: 1-2x/week  PT DURATION: 8 weeks  PLANNED INTERVENTIONS: 97164- PT Re-evaluation, 97110-Therapeutic exercises, 97530- Therapeutic activity, 97112- Neuromuscular re-education, 97535- Self Care, 16109- Manual therapy, [phone]- Gait training, [phone]- Orthotic Fit/training, [phone]- Canalith repositioning, [id number]- Aquatic Therapy, [phone]- Splinting, Patient/Family education, Balance training, Stair training, Taping, Dry Needling, Joint mobilization, Joint manipulation, Spinal manipulation, Spinal  mobilization, Scar mobilization, and DME instructions.  PLAN FOR NEXT SESSION: f/u with HEP, core and glute strength   Reola Mosher Nicholai Willette, PT 02/14/2023, 12:39 PM

## 2023-02-15 DIAGNOSIS — M1711 Unilateral primary osteoarthritis, right knee: Secondary | ICD-10-CM | POA: Diagnosis not present

## 2023-02-15 DIAGNOSIS — M170 Bilateral primary osteoarthritis of knee: Secondary | ICD-10-CM | POA: Diagnosis not present

## 2023-02-15 DIAGNOSIS — M1712 Unilateral primary osteoarthritis, left knee: Secondary | ICD-10-CM | POA: Diagnosis not present

## 2023-02-21 ENCOUNTER — Encounter (HOSPITAL_BASED_OUTPATIENT_CLINIC_OR_DEPARTMENT_OTHER): Payer: Self-pay | Admitting: Physical Therapy

## 2023-02-21 ENCOUNTER — Ambulatory Visit (HOSPITAL_BASED_OUTPATIENT_CLINIC_OR_DEPARTMENT_OTHER): Payer: Medicare HMO | Attending: Neurology | Admitting: Physical Therapy

## 2023-02-21 DIAGNOSIS — R29898 Other symptoms and signs involving the musculoskeletal system: Secondary | ICD-10-CM | POA: Diagnosis not present

## 2023-02-21 DIAGNOSIS — M6281 Muscle weakness (generalized): Secondary | ICD-10-CM | POA: Insufficient documentation

## 2023-02-21 DIAGNOSIS — R2689 Other abnormalities of gait and mobility: Secondary | ICD-10-CM | POA: Diagnosis not present

## 2023-02-21 DIAGNOSIS — M5459 Other low back pain: Secondary | ICD-10-CM | POA: Insufficient documentation

## 2023-02-21 NOTE — Therapy (Signed)
 OUTPATIENT PHYSICAL THERAPY THORACOLUMBAR TREATMENT   Patient Name: Melissa Tapia MRN: [id number] DOB:[birth year], 83 y.o., female Today's Date: 02/21/2023  END OF SESSION:  PT End of Session - 02/21/23 1230     Visit Number 2    Number of Visits 16    Date for PT Re-Evaluation 04/11/23    Authorization Type [employer]    PT Start Time 1228    PT Stop Time 1309    PT Time Calculation (min) 41 min    Behavior During Therapy [facility] for tasks assessed/performed             Past Medical History:  Diagnosis Date   Hypercholesteremia    Hypertension    History reviewed. No pertinent surgical history. There are no active problems to display for this patient.   PCP: Oneil Neth MD  REFERRING PROVIDER: Patel, Donika K, DO  REFERRING DIAG: [phone] (ICD-10-CM) - Lumbosacral radiculopathy  Rationale for Evaluation and Treatment: Rehabilitation  THERAPY DIAG:  Other low back pain  Muscle weakness (generalized)  Other abnormalities of gait and mobility  Other symptoms and signs involving the musculoskeletal system  ONSET DATE: October/November 2024  SUBJECTIVE:                                                                                                                                                                                           SUBJECTIVE STATEMENT: Pt reports no changes from evaluation.  She reports compliance with HEP 2x / day.     From initial evaluation:  Patient states some numbness in her feet. In 2021/2022, her sciatic nerve was bothering her and she went to a chiropractor which seemed to help. Has not had any issues since until November 2024. Back bothers her with lifting and was with decorating at the holidays. During the day it does no bother her unless lifting, vacuuming. At night, she can sleep several hours without issues but it bothers her after at 4-5 hours, she then ices it and it seems to help but then notes numbness in feet. Tries walking  a bit and that helps which allows her to return to sleep. Then wakes up early morning with same situation. Numbness bilaterally at night with L>R. Sleeps on sides, mostly on L but wakes up on R.   PERTINENT HISTORY:  HTN, HLD  PAIN:  Are you having pain? No  PRECAUTIONS: None  WEIGHT BEARING RESTRICTIONS: No  FALLS:  Has patient fallen in last 6 months? No  PLOF: Independent  PATIENT GOALS: sleep through the night and no numbness in her feet   OBJECTIVE: (objective measures from initial evaluation  unless otherwise dated)  PATIENT SURVEYS:  Modified Oswestry 4%   SCREENING FOR RED FLAGS: Bowel or bladder incontinence: No Spinal tumors: No Cauda equina syndrome: No Compression fracture: No Abdominal aneurysm: No  COGNITION: Overall cognitive status: Within functional limits for tasks assessed     SENSATION: WFL   POSTURE: rounded shoulders and forward head  PALPATION: TTP L piriformis  LUMBAR ROM:   AROM eval  Flexion 0% limited  Extension 25% limited  Right lateral flexion 0% limited  Left lateral flexion 0% limited  Right rotation   Left rotation    (Blank rows = not tested) * = pain/symptoms  LOWER EXTREMITY ROM:   bilateral hip extension limited, slightly crouched in standing  Active  Right eval Left eval  Hip flexion    Hip extension    Hip abduction    Hip adduction    Hip internal rotation    Hip external rotation    Knee flexion    Knee extension    Ankle dorsiflexion    Ankle plantarflexion    Ankle inversion    Ankle eversion     (Blank rows = not tested) * = pain/symptoms  LOWER EXTREMITY MMT:    MMT Right eval Left eval  Hip flexion 5 5  Hip extension 3+ 3+  Hip abduction 4+ 4+  Hip adduction    Hip internal rotation    Hip external rotation    Knee flexion 5 5  Knee extension 5 5  Ankle dorsiflexion    Ankle plantarflexion    Ankle inversion    Ankle eversion     (Blank rows = not tested) * =  pain/symptoms   FUNCTIONAL TESTS:  5 times sit to stand: 19.31 seconds without UE support, dynamic knee vaglus bilaterally Transfer STS: bilateral dynamic knee valgus, slightly crouched position in standing.   GAIT: Distance walked: 100 feet Assistive device utilized: None Level of assistance: Complete Independence Comments: slightly crouched gait.   TODAY'S TREATMENT:                                                                                                                              DATE:  02/21/23  NuStep, L3, LE only x 5 min for warm up - therapist present to monitor Standing trunk ext to neutral  UE on counter:  hip ext to touch toe 2 x 5 Reviewed body mechanics for sit to/from supine via log roll; pt returned demo Bridge  2 x 10  - 2nd set with 3 sec hold in ext Hooklying: glute /piriformis stretch x20s x 2 each LE;  hamstring stretch with strap x 20s x 2 each LE;  90/90 nerve glides x 10 each LE  PATIENT EDUCATION:  Education details: reviewed/updated HEP; posture and estate manager/land agent (handout issued) Person educated: Patient Education method: Explanation, Demonstration, and Handouts Education comprehension: verbalized understanding, returned demonstration, verbal cues required, and tactile cues required  HOME EXERCISE PROGRAM: Access  Code: [email or web address] URL: [email or web address]  ASSESSMENT:  CLINICAL IMPRESSION: Pt reporting more symptoms in LLE than RLE when performing exercises (more fatigue, more stretch.SABRA).   She tolerated all exercises well; no pain or numbness in LE during session.   Will continue to educate pt on posture and body mechanics.  Goals are ongoing.    From initial evaluation:  Patient a 83 y.o. y.o. female who was seen today for physical therapy evaluation and treatment for lumbosacral radiculopathy. Patient presents with pain limited deficits in lumbar spine and LE strength, ROM, endurance, activity tolerance, and functional mobility  with ADL. Patient is having to modify and restrict ADL as indicated by outcome measure score as well as subjective information and objective measures which is affecting overall participation. Patient will benefit from skilled physical therapy in order to improve function and reduce impairment.  OBJECTIVE IMPAIRMENTS: decreased activity tolerance, decreased endurance, decreased mobility, decreased ROM, decreased strength, increased muscle spasms, impaired flexibility, impaired sensation, improper body mechanics, postural dysfunction, and pain.   ACTIVITY LIMITATIONS: carrying, lifting, bending, standing, squatting, stairs, transfers, bed mobility, locomotion level, and caring for others  PARTICIPATION LIMITATIONS: meal prep, cleaning, laundry, shopping, community activity, yard work, and sleeping  PERSONAL FACTORS: Age, Time since onset of injury/illness/exacerbation, and 1-2 comorbidities: HTN, HLD  are also affecting patient's functional outcome.   REHAB POTENTIAL: Good  CLINICAL DECISION MAKING: Stable/uncomplicated  EVALUATION COMPLEXITY: Low   GOALS: Goals reviewed with patient? Yes  SHORT TERM GOALS: Target date: 03/14/2023    Patient will be independent with HEP in order to improve functional outcomes. Baseline:  Goal status: INITIAL  2.  Patient will report at least 25% improvement in symptoms for improved quality of life. Baseline:  Goal status: INITIAL    LONG TERM GOALS: Target date: 04/11/2023    Patient will report at least 75% improvement in symptoms for improved quality of life. Baseline:  Goal status: INITIAL  2.  Patient will improve modified Oswestry to less than 2% in order to indicate improved tolerance to activity. Baseline:  Goal status: INITIAL  3.  Patient will demonstrate grade of 5/5 MMT grade in all tested musculature as evidence of improved strength to assist with stair ambulation and gait.   Baseline:  Goal status: INITIAL  4.  Patient will be  able to complete 5x STS in under 11.4 seconds in order to demonstrate improved functional strength. Baseline:  Goal status: INITIAL  5.  Patient will report improved sleep for improved quality of life.  Baseline:  Goal status: INITIAL    PLAN:  PT FREQUENCY: 1-2x/week  PT DURATION: 8 weeks  PLANNED INTERVENTIONS: 97164- PT Re-evaluation, 97110-Therapeutic exercises, 97530- Therapeutic activity, 97112- Neuromuscular re-education, 97535- Self Care, [id number]- Manual therapy, [phone]- Gait training, [phone]- Orthotic Fit/training, [phone]- Canalith repositioning, [id number]- Aquatic Therapy, [phone]- Splinting, Patient/Family education, Balance training, Stair training, Taping, Dry Needling, Joint mobilization, Joint manipulation, Spinal manipulation, Spinal mobilization, Scar mobilization, and DME instructions.  PLAN FOR NEXT SESSION: f/u with HEP, core and glute strength  Delon Aquas, PTA 02/21/23 2:25 PM [facility] [address] [place], [place], [address] Phone: [phone]   Fax:  [phone]

## 2023-02-22 ENCOUNTER — Ambulatory Visit (HOSPITAL_BASED_OUTPATIENT_CLINIC_OR_DEPARTMENT_OTHER): Payer: Medicare HMO | Admitting: Physical Therapy

## 2023-02-24 ENCOUNTER — Encounter (HOSPITAL_BASED_OUTPATIENT_CLINIC_OR_DEPARTMENT_OTHER): Payer: Self-pay | Admitting: Physical Therapy

## 2023-02-24 ENCOUNTER — Ambulatory Visit (HOSPITAL_BASED_OUTPATIENT_CLINIC_OR_DEPARTMENT_OTHER): Payer: Medicare HMO | Admitting: Physical Therapy

## 2023-02-24 DIAGNOSIS — R2689 Other abnormalities of gait and mobility: Secondary | ICD-10-CM | POA: Diagnosis not present

## 2023-02-24 DIAGNOSIS — M6281 Muscle weakness (generalized): Secondary | ICD-10-CM

## 2023-02-24 DIAGNOSIS — M5459 Other low back pain: Secondary | ICD-10-CM | POA: Diagnosis not present

## 2023-02-24 DIAGNOSIS — R29898 Other symptoms and signs involving the musculoskeletal system: Secondary | ICD-10-CM | POA: Diagnosis not present

## 2023-02-24 NOTE — Therapy (Signed)
 OUTPATIENT PHYSICAL THERAPY THORACOLUMBAR TREATMENT   Patient Name: Melissa Tapia MRN: [id number] DOB:02-19-1940, 83 y.o., female Today's Date: 02/24/2023  END OF SESSION:  PT End of Session - 02/24/23 1356     Visit Number 3    Number of Visits 16    Date for PT Re-Evaluation 04/11/23    Authorization Type [employer]    PT Start Time 1345    PT Stop Time 1423    PT Time Calculation (min) 38 min    Activity Tolerance Patient tolerated treatment well    Behavior During Therapy WFL for tasks assessed/performed             Past Medical History:  Diagnosis Date   Hypercholesteremia    Hypertension    History reviewed. No pertinent surgical history. There are no active problems to display for this patient.   PCP: Oneil Neth MD  REFERRING PROVIDER: Patel, Donika K, DO  REFERRING DIAG: [phone] (ICD-10-CM) - Lumbosacral radiculopathy  Rationale for Evaluation and Treatment: Rehabilitation  THERAPY DIAG:  Other low back pain  Muscle weakness (generalized)  Other abnormalities of gait and mobility  ONSET DATE: October/November 2024  SUBJECTIVE:                                                                                                                                                                                           SUBJECTIVE STATEMENT: Pt reports some soreness in LEs after last session.  No improvement in numbness in legs yet.     From initial evaluation:  Patient states some numbness in her feet. In 2021/2022, her sciatic nerve was bothering her and she went to a chiropractor which seemed to help. Has not had any issues since until November 2024. Back bothers her with lifting and was with decorating at the holidays. During the day it does no bother her unless lifting, vacuuming. At night, she can sleep several hours without issues but it bothers her after at 4-5 hours, she then ices it and it seems to help but then notes numbness in feet. Tries  walking a bit and that helps which allows her to return to sleep. Then wakes up early morning with same situation. Numbness bilaterally at night with L>R. Sleeps on sides, mostly on L but wakes up on R.   PERTINENT HISTORY:  HTN, HLD  PAIN:  Are you having pain? No  PRECAUTIONS: None  WEIGHT BEARING RESTRICTIONS: No  FALLS:  Has patient fallen in last 6 months? No  PLOF: Independent  PATIENT GOALS: sleep through the night and no numbness in her feet   OBJECTIVE: (objective measures from  initial evaluation unless otherwise dated)  PATIENT SURVEYS:  Modified Oswestry 4%   SCREENING FOR RED FLAGS: Bowel or bladder incontinence: No Spinal tumors: No Cauda equina syndrome: No Compression fracture: No Abdominal aneurysm: No  COGNITION: Overall cognitive status: Within functional limits for tasks assessed     SENSATION: WFL   POSTURE: rounded shoulders and forward head  PALPATION: TTP L piriformis  LUMBAR ROM:   AROM eval  Flexion 0% limited  Extension 25% limited  Right lateral flexion 0% limited  Left lateral flexion 0% limited  Right rotation   Left rotation    (Blank rows = not tested) * = pain/symptoms  LOWER EXTREMITY ROM:   bilateral hip extension limited, slightly crouched in standing  Active  Right eval Left eval  Hip flexion    Hip extension    Hip abduction    Hip adduction    Hip internal rotation    Hip external rotation    Knee flexion    Knee extension    Ankle dorsiflexion    Ankle plantarflexion    Ankle inversion    Ankle eversion     (Blank rows = not tested) * = pain/symptoms  LOWER EXTREMITY MMT:    MMT Right eval Left eval  Hip flexion 5 5  Hip extension 3+ 3+  Hip abduction 4+ 4+  Hip adduction    Hip internal rotation    Hip external rotation    Knee flexion 5 5  Knee extension 5 5  Ankle dorsiflexion    Ankle plantarflexion    Ankle inversion    Ankle eversion     (Blank rows = not tested) * =  pain/symptoms   FUNCTIONAL TESTS:  5 times sit to stand: 19.31 seconds without UE support, dynamic knee vaglus bilaterally Transfer STS: bilateral dynamic knee valgus, slightly crouched position in standing.   GAIT: Distance walked: 100 feet Assistive device utilized: None Level of assistance: Complete Independence Comments: slightly crouched gait.   TODAY'S TREATMENT:                                                                                                                              DATE:  02/24/23 Therapeutic Ex:  NuStep, L4,UE/LE x 5.5 min for warm up - therapist present to monitor Standing trunk ext to neutral  UE on counter:  hip ext to touch toe x 10; hip abdct x 10; heel raises x 10 Seated:  glute/ piriformis stretch x 15s x 2 each; hamstring stretch x 15s x 2 each LE  Therapeutic Activity:  Sit to/from stand from chair with cues on hip hinge and controlled descent x  10  Self care: pt instructed in self massage to gluteals and lumbar musculature; returned demo with cues    02/21/23  NuStep, L3, LE only x 5 min for warm up - therapist present to monitor Standing trunk ext to neutral  UE on counter:  hip ext to  touch toe 2 x 5 Reviewed body mechanics for sit to/from supine via log roll; pt returned demo Bridge  2 x 10  - 2nd set with 3 sec hold in ext Hooklying: glute /piriformis stretch x20s x 2 each LE;  hamstring stretch with strap x 20s x 2 each LE;  90/90 nerve glides x 10 each LE  PATIENT EDUCATION:  Education details: reviewed/updated HEP; posture and estate manager/land agent (handout issued) Person educated: Patient Education method: Explanation, Demonstration, and Handouts Education comprehension: verbalized understanding, returned demonstration, verbal cues required, and tactile cues required  HOME EXERCISE PROGRAM: Access Code: [email or web address] URL: [email or web address]  ASSESSMENT:  CLINICAL IMPRESSION: Pt reported increased symptoms in Lt glute  with all exercises today, (pain up to 2-3/10).  Pt requires cues for mechanics for STS.   Will continue to educate pt on posture and body mechanics throughout upcoming sessions.  Goals are ongoing.    From initial evaluation:  Patient a 83 y.o. y.o. female who was seen today for physical therapy evaluation and treatment for lumbosacral radiculopathy. Patient presents with pain limited deficits in lumbar spine and LE strength, ROM, endurance, activity tolerance, and functional mobility with ADL. Patient is having to modify and restrict ADL as indicated by outcome measure score as well as subjective information and objective measures which is affecting overall participation. Patient will benefit from skilled physical therapy in order to improve function and reduce impairment.  OBJECTIVE IMPAIRMENTS: decreased activity tolerance, decreased endurance, decreased mobility, decreased ROM, decreased strength, increased muscle spasms, impaired flexibility, impaired sensation, improper body mechanics, postural dysfunction, and pain.   ACTIVITY LIMITATIONS: carrying, lifting, bending, standing, squatting, stairs, transfers, bed mobility, locomotion level, and caring for others  PARTICIPATION LIMITATIONS: meal prep, cleaning, laundry, shopping, community activity, yard work, and sleeping  PERSONAL FACTORS: Age, Time since onset of injury/illness/exacerbation, and 1-2 comorbidities: HTN, HLD  are also affecting patient's functional outcome.   REHAB POTENTIAL: Good  CLINICAL DECISION MAKING: Stable/uncomplicated  EVALUATION COMPLEXITY: Low   GOALS: Goals reviewed with patient? Yes  SHORT TERM GOALS: Target date: 03/14/2023    Patient will be independent with HEP in order to improve functional outcomes. Baseline:  Goal status: INITIAL  2.  Patient will report at least 25% improvement in symptoms for improved quality of life. Baseline:  Goal status: INITIAL    LONG TERM GOALS: Target date:  04/11/2023    Patient will report at least 75% improvement in symptoms for improved quality of life. Baseline:  Goal status: INITIAL  2.  Patient will improve modified Oswestry to less than 2% in order to indicate improved tolerance to activity. Baseline:  Goal status: INITIAL  3.  Patient will demonstrate grade of 5/5 MMT grade in all tested musculature as evidence of improved strength to assist with stair ambulation and gait.   Baseline:  Goal status: INITIAL  4.  Patient will be able to complete 5x STS in under 11.4 seconds in order to demonstrate improved functional strength. Baseline:  Goal status: INITIAL  5.  Patient will report improved sleep for improved quality of life.  Baseline:  Goal status: INITIAL    PLAN:  PT FREQUENCY: 1-2x/week  PT DURATION: 8 weeks  PLANNED INTERVENTIONS: 97164- PT Re-evaluation, 97110-Therapeutic exercises, 97530- Therapeutic activity, 97112- Neuromuscular re-education, 97535- Self Care, 02859- Manual therapy, [phone]- Gait training, [phone]- Orthotic Fit/training, [phone]- Canalith repositioning, [id number]- Aquatic Therapy, [phone]- Splinting, Patient/Family education, Balance training, Stair training, Taping, Dry Needling, Joint mobilization, Joint manipulation, Spinal manipulation, Spinal mobilization, Scar mobilization, and  DME instructions.  PLAN FOR NEXT SESSION: f/u with HEP, core and glute strength  Delon Aquas, PTA 02/24/23 2:42 PM [facility] [address] [place], [place], [address] Phone: [phone]   Fax:  [phone]

## 2023-03-03 ENCOUNTER — Ambulatory Visit (HOSPITAL_BASED_OUTPATIENT_CLINIC_OR_DEPARTMENT_OTHER): Payer: Medicare HMO | Admitting: Physical Therapy

## 2023-03-03 ENCOUNTER — Encounter (HOSPITAL_BASED_OUTPATIENT_CLINIC_OR_DEPARTMENT_OTHER): Payer: Self-pay | Admitting: Physical Therapy

## 2023-03-03 DIAGNOSIS — M6281 Muscle weakness (generalized): Secondary | ICD-10-CM | POA: Diagnosis not present

## 2023-03-03 DIAGNOSIS — M5459 Other low back pain: Secondary | ICD-10-CM

## 2023-03-03 DIAGNOSIS — R29898 Other symptoms and signs involving the musculoskeletal system: Secondary | ICD-10-CM | POA: Diagnosis not present

## 2023-03-03 DIAGNOSIS — R2689 Other abnormalities of gait and mobility: Secondary | ICD-10-CM | POA: Diagnosis not present

## 2023-03-03 NOTE — Therapy (Signed)
OUTPATIENT PHYSICAL THERAPY THORACOLUMBAR TREATMENT   Patient Name: Melissa Tapia MRN: [id number] DOB:1940/07/01, 83 y.o., female Today's Date: 03/03/2023  END OF SESSION:  PT End of Session - 03/03/23 0937     Visit Number 4    Number of Visits 16    Date for PT Re-Evaluation 04/11/23    Authorization Type [employer]    PT Start Time [phone]    PT Stop Time 1012    PT Time Calculation (min) 39 min    Behavior During Therapy [facility] for tasks assessed/performed             Past Medical History:  Diagnosis Date   Hypercholesteremia    Hypertension    History reviewed. No pertinent surgical history. There are no active problems to display for this patient.   PCP: Rodrigo Ran MD  REFERRING PROVIDER: Glendale Chard, DO  REFERRING DIAG: [phone] (ICD-10-CM) - Lumbosacral radiculopathy  Rationale for Evaluation and Treatment: Rehabilitation  THERAPY DIAG:  Other low back pain  Muscle weakness (generalized)  Other abnormalities of gait and mobility  Other symptoms and signs involving the musculoskeletal system  ONSET DATE: October/November 2024  SUBJECTIVE:                                                                                                                                                                                           SUBJECTIVE STATEMENT: "I'm sore".  Pt reports she has been compliant with HEP 2x / day.  She reports she is waking less in the night from pain.  She wonders if she should get a new pillow or mattress.     From initial evaluation:  Patient states some numbness in her feet. In 2021/2022, her sciatic nerve was bothering her and she went to a chiropractor which seemed to help. Has not had any issues since until November 2024. Back bothers her with lifting and was with decorating at the holidays. During the day it does no bother her unless lifting, vacuuming. At night, she can sleep several hours without issues but it bothers her after  at 4-5 hours, she then ices it and it seems to help but then notes numbness in feet. Tries walking a bit and that helps which allows her to return to sleep. Then wakes up early morning with same situation. Numbness bilaterally at night with L>R. Sleeps on sides, mostly on L but wakes up on R.   PERTINENT HISTORY:  HTN, HLD  PAIN:  Are you having pain? Yes NPRS:  4-5/10 Location: thighs and buttocks Description: soreness   PRECAUTIONS: None  WEIGHT BEARING RESTRICTIONS: No  FALLS:  Has patient fallen in last 6 months? No  PLOF: Independent  PATIENT GOALS: sleep through the night and no numbness in her feet   OBJECTIVE: (objective measures from initial evaluation unless otherwise dated)  PATIENT SURVEYS:  Modified Oswestry 4%   SCREENING FOR RED FLAGS: Bowel or bladder incontinence: No Spinal tumors: No Cauda equina syndrome: No Compression fracture: No Abdominal aneurysm: No  COGNITION: Overall cognitive status: Within functional limits for tasks assessed     SENSATION: WFL   POSTURE: rounded shoulders and forward head  PALPATION: TTP L piriformis  LUMBAR ROM:   AROM eval  Flexion 0% limited  Extension 25% limited  Right lateral flexion 0% limited  Left lateral flexion 0% limited  Right rotation   Left rotation    (Blank rows = not tested) * = pain/symptoms  LOWER EXTREMITY ROM:   bilateral hip extension limited, slightly crouched in standing  Active  Right eval Left eval  Hip flexion    Hip extension    Hip abduction    Hip adduction    Hip internal rotation    Hip external rotation    Knee flexion    Knee extension    Ankle dorsiflexion    Ankle plantarflexion    Ankle inversion    Ankle eversion     (Blank rows = not tested) * = pain/symptoms  LOWER EXTREMITY MMT:    MMT Right eval Left eval  Hip flexion 5 5  Hip extension 3+ 3+  Hip abduction 4+ 4+  Hip adduction    Hip internal rotation    Hip external rotation    Knee  flexion 5 5  Knee extension 5 5  Ankle dorsiflexion    Ankle plantarflexion    Ankle inversion    Ankle eversion     (Blank rows = not tested) * = pain/symptoms   FUNCTIONAL TESTS:  5 times sit to stand: 19.31 seconds without UE support, dynamic knee vaglus bilaterally Transfer STS: bilateral dynamic knee valgus, slightly crouched position in standing.   GAIT: Distance walked: 100 feet Assistive device utilized: None Level of assistance: Complete Independence Comments: slightly crouched gait.   TODAY'S TREATMENT:                                                                                                                              DATE:  [facility] Adult PT Treatment:                                                DATE: 03/03/23  Therapeutic Exercise: NuStep, L4,UE/LE x 6 min for warm up - therapist present to monitor At counter: Hip ext to toe touch 2 x10;  Hip abdct 2 x 10;  heel/toe raises x 10; marching x 10 Seated:  glute/ piriformis stretch x 15s x  2 each- 2 sets; hamstring stretch x 15s x 2 each LE  Therapeutic Activity: STS from standard chair with 4 sec eccentric lowering to chair x 8, cues for head position and to widen feet Squat holding elevated table with cues for hip hinge x 5    [facility] Adult PT Treatment:                                                DATE:02/24/23 Therapeutic Ex:  NuStep, L4,UE/LE x 5.5 min for warm up - therapist present to monitor Standing trunk ext to neutral  UE on counter:  hip ext to touch toe x 10; hip abdct x 10; heel raises x 10 Seated:  glute/ piriformis stretch x 15s x 2 each; hamstring stretch x 15s x 2 each LE  Therapeutic Activity:  Sit to/from stand from chair with cues on hip hinge and controlled descent x  10  Self care: pt instructed in self massage to gluteals and lumbar musculature; returned demo with cues    [facility] Adult PT Treatment:                                                DATE:02/21/23  NuStep, L3, LE only x 5 min  for warm up - therapist present to monitor Standing trunk ext to neutral  UE on counter:  hip ext to touch toe 2 x 5 Reviewed body mechanics for sit to/from supine via log roll; pt returned demo Bridge  2 x 10  - 2nd set with 3 sec hold in ext Hooklying: glute /piriformis stretch x20s x 2 each LE;  hamstring stretch with strap x 20s x 2 each LE;  90/90 nerve glides x 10 each LE  PATIENT EDUCATION:  Education details: reviewed/updated HEP Person educated: Patient Education method: Programmer, multimedia, Facilities manager, and Handouts Education comprehension: verbalized understanding, returned demonstration, verbal cues required, and tactile cues required  HOME EXERCISE PROGRAM: Access Code: [email or web address] URL: [email or web address]  ASSESSMENT:  CLINICAL IMPRESSION: Pt reporting increased exercise soreness in glutes and hamstrings since performing HEP 2x/day (at times only 4 hours apart).  Updated HEP to more seated / standing exercises and reduced frequency of bridges. STS mechanics improving.   Will continue to educate pt on posture and body mechanics throughout upcoming sessions.  Progressing towards goals.  May trial standing resisted shoulder ext with core engagement next visit.    From initial evaluation:  Patient a 83 y.o. y.o. female who was seen today for physical therapy evaluation and treatment for lumbosacral radiculopathy. Patient presents with pain limited deficits in lumbar spine and LE strength, ROM, endurance, activity tolerance, and functional mobility with ADL. Patient is having to modify and restrict ADL as indicated by outcome measure score as well as subjective information and objective measures which is affecting overall participation. Patient will benefit from skilled physical therapy in order to improve function and reduce impairment.  OBJECTIVE IMPAIRMENTS: decreased activity tolerance, decreased endurance, decreased mobility, decreased ROM, decreased strength, increased  muscle spasms, impaired flexibility, impaired sensation, improper body mechanics, postural dysfunction, and pain.   ACTIVITY LIMITATIONS: carrying, lifting, bending, standing, squatting, stairs, transfers, bed mobility, locomotion level, and caring for others  PARTICIPATION LIMITATIONS: meal prep, cleaning, laundry, shopping,  community activity, yard work, and sleeping  PERSONAL FACTORS: Age, Time since onset of injury/illness/exacerbation, and 1-2 comorbidities: HTN, HLD  are also affecting patient's functional outcome.   REHAB POTENTIAL: Good  CLINICAL DECISION MAKING: Stable/uncomplicated  EVALUATION COMPLEXITY: Low   GOALS: Goals reviewed with patient? Yes  SHORT TERM GOALS: Target date: 03/14/2023    Patient will be independent with HEP in order to improve functional outcomes. Baseline:  Goal status: INITIAL  2.  Patient will report at least 25% improvement in symptoms for improved quality of life. Baseline:  Goal status: INITIAL    LONG TERM GOALS: Target date: 04/11/2023    Patient will report at least 75% improvement in symptoms for improved quality of life. Baseline:  Goal status: INITIAL  2.  Patient will improve modified Oswestry to less than 2% in order to indicate improved tolerance to activity. Baseline:  Goal status: INITIAL  3.  Patient will demonstrate grade of 5/5 MMT grade in all tested musculature as evidence of improved strength to assist with stair ambulation and gait.   Baseline:  Goal status: INITIAL  4.  Patient will be able to complete 5x STS in under 11.4 seconds in order to demonstrate improved functional strength. Baseline:  Goal status: INITIAL  5.  Patient will report improved sleep for improved quality of life.  Baseline:  Goal status: INITIAL    PLAN:  PT FREQUENCY: 1-2x/week  PT DURATION: 8 weeks  PLANNED INTERVENTIONS: 97164- PT Re-evaluation, 97110-Therapeutic exercises, 97530- Therapeutic activity, 97112- Neuromuscular  re-education, 97535- Self Care, 29528- Manual therapy, [phone]- Gait training, [phone]- Orthotic Fit/training, [phone]- Canalith repositioning, [id number]- Aquatic Therapy, [phone]- Splinting, Patient/Family education, Balance training, Stair training, Taping, Dry Needling, Joint mobilization, Joint manipulation, Spinal manipulation, Spinal mobilization, Scar mobilization, and DME instructions.  PLAN FOR NEXT SESSION: f/u with HEP, core and glute strength  Mayer Camel, PTA 03/03/23 10:34 AM [facility] [address] [place], [place], [address] Phone: [phone]   Fax:  [phone]

## 2023-03-09 ENCOUNTER — Ambulatory Visit (HOSPITAL_BASED_OUTPATIENT_CLINIC_OR_DEPARTMENT_OTHER): Payer: Medicare HMO | Admitting: Physical Therapy

## 2023-03-15 ENCOUNTER — Ambulatory Visit (HOSPITAL_BASED_OUTPATIENT_CLINIC_OR_DEPARTMENT_OTHER): Payer: Medicare HMO | Admitting: Physical Therapy

## 2023-03-15 ENCOUNTER — Encounter (HOSPITAL_BASED_OUTPATIENT_CLINIC_OR_DEPARTMENT_OTHER): Payer: Self-pay | Admitting: Physical Therapy

## 2023-03-15 DIAGNOSIS — R29898 Other symptoms and signs involving the musculoskeletal system: Secondary | ICD-10-CM

## 2023-03-15 DIAGNOSIS — M6281 Muscle weakness (generalized): Secondary | ICD-10-CM

## 2023-03-15 DIAGNOSIS — M5459 Other low back pain: Secondary | ICD-10-CM | POA: Diagnosis not present

## 2023-03-15 DIAGNOSIS — R2689 Other abnormalities of gait and mobility: Secondary | ICD-10-CM | POA: Diagnosis not present

## 2023-03-15 NOTE — Therapy (Signed)
 OUTPATIENT PHYSICAL THERAPY THORACOLUMBAR TREATMENT   Patient Name: Melissa Tapia MRN: [id number] DOB:1940-02-18, 83 y.o., female Today's Date: 03/15/2023  END OF SESSION:  PT End of Session - 03/15/23 1301     Visit Number 5    Number of Visits 16    Date for PT Re-Evaluation 04/11/23    Authorization Type [employer] Medicare    PT Start Time 1301    PT Stop Time 1345    PT Time Calculation (min) 44 min    Behavior During Therapy [facility] for tasks assessed/performed             Past Medical History:  Diagnosis Date   Hypercholesteremia    Hypertension    History reviewed. No pertinent surgical history. There are no active problems to display for this patient.   PCP: Rodrigo Ran MD  REFERRING PROVIDER: Glendale Chard, DO  REFERRING DIAG: [phone] (ICD-10-CM) - Lumbosacral radiculopathy  Rationale for Evaluation and Treatment: Rehabilitation  THERAPY DIAG:  Other low back pain  Muscle weakness (generalized)  Other abnormalities of gait and mobility  Other symptoms and signs involving the musculoskeletal system  ONSET DATE: October/November 2024  SUBJECTIVE:                                                                                                                                                                                           SUBJECTIVE STATEMENT: Patient states some soreness in calves. HEP going well. Some questions on HEP. Still numbness in feet, some improvement in symptoms in feet but they are there still some nights.     From initial evaluation:  Patient states some numbness in her feet. In 2021/2022, her sciatic nerve was bothering her and she went to a chiropractor which seemed to help. Has not had any issues since until November 2024. Back bothers her with lifting and was with decorating at the holidays. During the day it does no bother her unless lifting, vacuuming. At night, she can sleep several hours without issues but it bothers her after  at 4-5 hours, she then ices it and it seems to help but then notes numbness in feet. Tries walking a bit and that helps which allows her to return to sleep. Then wakes up early morning with same situation. Numbness bilaterally at night with L>R. Sleeps on sides, mostly on L but wakes up on R.   PERTINENT HISTORY:  HTN, HLD  PAIN:  Are you having pain? Yes NPRS:  4-5/10 Location: thighs and buttocks Description: soreness   PRECAUTIONS: None  WEIGHT BEARING RESTRICTIONS: No  FALLS:  Has patient fallen in last 6 months?  No  PLOF: Independent  PATIENT GOALS: sleep through the night and no numbness in her feet   OBJECTIVE: (objective measures from initial evaluation unless otherwise dated)  PATIENT SURVEYS:  Modified Oswestry 4%   SCREENING FOR RED FLAGS: Bowel or bladder incontinence: No Spinal tumors: No Cauda equina syndrome: No Compression fracture: No Abdominal aneurysm: No  COGNITION: Overall cognitive status: Within functional limits for tasks assessed     SENSATION: WFL   POSTURE: rounded shoulders and forward head  PALPATION: TTP L piriformis  LUMBAR ROM:   AROM eval  Flexion 0% limited  Extension 25% limited  Right lateral flexion 0% limited  Left lateral flexion 0% limited  Right rotation   Left rotation    (Blank rows = not tested) * = pain/symptoms  LOWER EXTREMITY ROM:   bilateral hip extension limited, slightly crouched in standing  Active  Right eval Left eval  Hip flexion    Hip extension    Hip abduction    Hip adduction    Hip internal rotation    Hip external rotation    Knee flexion    Knee extension    Ankle dorsiflexion    Ankle plantarflexion    Ankle inversion    Ankle eversion     (Blank rows = not tested) * = pain/symptoms  LOWER EXTREMITY MMT:    MMT Right eval Left eval  Hip flexion 5 5  Hip extension 3+ 3+  Hip abduction 4+ 4+  Hip adduction    Hip internal rotation    Hip external rotation    Knee  flexion 5 5  Knee extension 5 5  Ankle dorsiflexion    Ankle plantarflexion    Ankle inversion    Ankle eversion     (Blank rows = not tested) * = pain/symptoms   FUNCTIONAL TESTS:  5 times sit to stand: 19.31 seconds without UE support, dynamic knee vaglus bilaterally Transfer STS: bilateral dynamic knee valgus, slightly crouched position in standing.   GAIT: Distance walked: 100 feet Assistive device utilized: None Level of assistance: Complete Independence Comments: slightly crouched gait.   TODAY'S TREATMENT:                                                                                                                              DATE:  03/15/23 NuStep, L5,UE/LE x 6 min for warm up - therapist present to monitor STS 1 x 10 Ab set with PPT 10 x 5 second holds March with ab set 3 x 10 Standing row RTB 1 x 10 with ab set Standing shoulder extension RTB 1 x 10 with ab set   [facility] Adult PT Treatment:                                                DATE:  03/03/23  Therapeutic Exercise: NuStep, L4,UE/LE x 6 min for warm up - therapist present to monitor At counter: Hip ext to toe touch 2 x10;  Hip abdct 2 x 10;  heel/toe raises x 10; marching x 10 Seated:  glute/ piriformis stretch x 15s x 2 each- 2 sets; hamstring stretch x 15s x 2 each LE  Therapeutic Activity: STS from standard chair with 4 sec eccentric lowering to chair x 8, cues for head position and to widen feet Squat holding elevated table with cues for hip hinge x 5    [facility] Adult PT Treatment:                                                DATE:02/24/23 Therapeutic Ex:  NuStep, L4,UE/LE x 5.5 min for warm up - therapist present to monitor Standing trunk ext to neutral  UE on counter:  hip ext to touch toe x 10; hip abdct x 10; heel raises x 10 Seated:  glute/ piriformis stretch x 15s x 2 each; hamstring stretch x 15s x 2 each LE  Therapeutic Activity:  Sit to/from stand from chair with cues on hip hinge and  controlled descent x  10  Self care: pt instructed in self massage to gluteals and lumbar musculature; returned demo with cues    [facility] Adult PT Treatment:                                                DATE:02/21/23  NuStep, L3, LE only x 5 min for warm up - therapist present to monitor Standing trunk ext to neutral  UE on counter:  hip ext to touch toe 2 x 5 Reviewed body mechanics for sit to/from supine via log roll; pt returned demo Bridge  2 x 10  - 2nd set with 3 sec hold in ext Hooklying: glute /piriformis stretch x20s x 2 each LE;  hamstring stretch with strap x 20s x 2 each LE;  90/90 nerve glides x 10 each LE  PATIENT EDUCATION:  Education details: reviewed/updated HEP Person educated: Patient Education method: Programmer, multimedia, Facilities manager, and Handouts Education comprehension: verbalized understanding, returned demonstration, verbal cues required, and tactile cues required  HOME EXERCISE PROGRAM: Access Code: [email or web address] URL: [email or web address]  ASSESSMENT:  CLINICAL IMPRESSION: Began session with discussion on HEP and symptoms, continued with nustep for warm up and conditioning. Improving STS mechanics. Began table core activation and strengthening exercises which are tolerated well. Patient will continue to benefit from physical therapy in order to improve function and reduce impairment.    From initial evaluation:  Patient a 83 y.o. y.o. female who was seen today for physical therapy evaluation and treatment for lumbosacral radiculopathy. Patient presents with pain limited deficits in lumbar spine and LE strength, ROM, endurance, activity tolerance, and functional mobility with ADL. Patient is having to modify and restrict ADL as indicated by outcome measure score as well as subjective information and objective measures which is affecting overall participation. Patient will benefit from skilled physical therapy in order to improve function and reduce  impairment.  OBJECTIVE IMPAIRMENTS: decreased activity tolerance, decreased endurance, decreased mobility, decreased ROM, decreased strength, increased muscle spasms, impaired flexibility, impaired sensation, improper body mechanics, postural dysfunction, and pain.  ACTIVITY LIMITATIONS: carrying, lifting, bending, standing, squatting, stairs, transfers, bed mobility, locomotion level, and caring for others  PARTICIPATION LIMITATIONS: meal prep, cleaning, laundry, shopping, community activity, yard work, and sleeping  PERSONAL FACTORS: Age, Time since onset of injury/illness/exacerbation, and 1-2 comorbidities: HTN, HLD  are also affecting patient's functional outcome.   REHAB POTENTIAL: Good  CLINICAL DECISION MAKING: Stable/uncomplicated  EVALUATION COMPLEXITY: Low   GOALS: Goals reviewed with patient? Yes  SHORT TERM GOALS: Target date: 03/14/2023    Patient will be independent with HEP in order to improve functional outcomes. Baseline:  Goal status: INITIAL  2.  Patient will report at least 25% improvement in symptoms for improved quality of life. Baseline:  Goal status: INITIAL    LONG TERM GOALS: Target date: 04/11/2023    Patient will report at least 75% improvement in symptoms for improved quality of life. Baseline:  Goal status: INITIAL  2.  Patient will improve modified Oswestry to less than 2% in order to indicate improved tolerance to activity. Baseline:  Goal status: INITIAL  3.  Patient will demonstrate grade of 5/5 MMT grade in all tested musculature as evidence of improved strength to assist with stair ambulation and gait.   Baseline:  Goal status: INITIAL  4.  Patient will be able to complete 5x STS in under 11.4 seconds in order to demonstrate improved functional strength. Baseline:  Goal status: INITIAL  5.  Patient will report improved sleep for improved quality of life.  Baseline:  Goal status: INITIAL    PLAN:  PT FREQUENCY:  1-2x/week  PT DURATION: 8 weeks  PLANNED INTERVENTIONS: 97164- PT Re-evaluation, 97110-Therapeutic exercises, 97530- Therapeutic activity, 97112- Neuromuscular re-education, 97535- Self Care, 09811- Manual therapy, [phone]- Gait training, [phone]- Orthotic Fit/training, [phone]- Canalith repositioning, [id number]- Aquatic Therapy, [phone]- Splinting, Patient/Family education, Balance training, Stair training, Taping, Dry Needling, Joint mobilization, Joint manipulation, Spinal manipulation, Spinal mobilization, Scar mobilization, and DME instructions.  PLAN FOR NEXT SESSION: f/u with HEP, core and glute strength  1:48 PM, 03/15/23 Wyman Songster PT, DPT Physical Therapist at [facility] [address] [place], [place], [address] Phone: [phone]   Fax:  [phone]

## 2023-03-17 ENCOUNTER — Ambulatory Visit (HOSPITAL_BASED_OUTPATIENT_CLINIC_OR_DEPARTMENT_OTHER): Payer: Medicare HMO | Admitting: Physical Therapy

## 2023-03-17 ENCOUNTER — Encounter (HOSPITAL_BASED_OUTPATIENT_CLINIC_OR_DEPARTMENT_OTHER): Payer: Self-pay | Admitting: Physical Therapy

## 2023-03-17 DIAGNOSIS — M6281 Muscle weakness (generalized): Secondary | ICD-10-CM

## 2023-03-17 DIAGNOSIS — R29898 Other symptoms and signs involving the musculoskeletal system: Secondary | ICD-10-CM | POA: Diagnosis not present

## 2023-03-17 DIAGNOSIS — M5459 Other low back pain: Secondary | ICD-10-CM | POA: Diagnosis not present

## 2023-03-17 DIAGNOSIS — R2689 Other abnormalities of gait and mobility: Secondary | ICD-10-CM | POA: Diagnosis not present

## 2023-03-17 NOTE — Therapy (Signed)
 OUTPATIENT PHYSICAL THERAPY THORACOLUMBAR TREATMENT   Patient Name: Melissa Tapia MRN: [id number] DOB:Jan 14, 1941, 83 y.o., female Today's Date: 03/17/2023  END OF SESSION:  PT End of Session - 03/17/23 1146     Visit Number 6    Number of Visits 16    Date for PT Re-Evaluation 04/11/23    Authorization Type [employer]    PT Start Time 1147    PT Stop Time 1225    PT Time Calculation (min) 38 min    Behavior During Therapy WFL for tasks assessed/performed             Past Medical History:  Diagnosis Date   Hypercholesteremia    Hypertension    History reviewed. No pertinent surgical history. There are no active problems to display for this patient.   PCP: Rodrigo Ran MD  REFERRING PROVIDER: Glendale Chard, DO  REFERRING DIAG: [phone] (ICD-10-CM) - Lumbosacral radiculopathy  Rationale for Evaluation and Treatment: Rehabilitation  THERAPY DIAG:  Other low back pain  Muscle weakness (generalized)  Other abnormalities of gait and mobility  ONSET DATE: October/November 2024  SUBJECTIVE:                                                                                                                                                                                           SUBJECTIVE STATEMENT: Patient states she woke up 1x last night due to cramping in LE.  She has noticed less pain in L knee upon standing.  She reports she has been more aware of her posture. She continues to have numbness in feet at night, but less intensity.  "No longer like needles or crawling ants, just numbness".    From initial evaluation:  Patient states some numbness in her feet. In 2021/2022, her sciatic nerve was bothering her and she went to a chiropractor which seemed to help. Has not had any issues since until November 2024. Back bothers her with lifting and was with decorating at the holidays. During the day it does no bother her unless lifting, vacuuming. At night, she can sleep  several hours without issues but it bothers her after at 4-5 hours, she then ices it and it seems to help but then notes numbness in feet. Tries walking a bit and that helps which allows her to return to sleep. Then wakes up early morning with same situation. Numbness bilaterally at night with L>R. Sleeps on sides, mostly on L but wakes up on R.   PERTINENT HISTORY:  HTN, HLD  PAIN:  Are you having pain? no NPRS:   Location: Description:   PRECAUTIONS: None  WEIGHT  BEARING RESTRICTIONS: No  FALLS:  Has patient fallen in last 6 months? No  PLOF: Independent  PATIENT GOALS: sleep through the night and no numbness in her feet   OBJECTIVE: (objective measures from initial evaluation unless otherwise dated)  PATIENT SURVEYS:  Modified Oswestry 4%   SCREENING FOR RED FLAGS: Bowel or bladder incontinence: No Spinal tumors: No Cauda equina syndrome: No Compression fracture: No Abdominal aneurysm: No  COGNITION: Overall cognitive status: Within functional limits for tasks assessed     SENSATION: WFL   POSTURE: rounded shoulders and forward head  PALPATION: TTP L piriformis  LUMBAR ROM:   AROM eval  Flexion 0% limited  Extension 25% limited  Right lateral flexion 0% limited  Left lateral flexion 0% limited  Right rotation   Left rotation    (Blank rows = not tested) * = pain/symptoms  LOWER EXTREMITY ROM:   bilateral hip extension limited, slightly crouched in standing  Active  Right eval Left eval  Hip flexion    Hip extension    Hip abduction    Hip adduction    Hip internal rotation    Hip external rotation    Knee flexion    Knee extension    Ankle dorsiflexion    Ankle plantarflexion    Ankle inversion    Ankle eversion     (Blank rows = not tested) * = pain/symptoms  LOWER EXTREMITY MMT:    MMT Right eval Left eval  Hip flexion 5 5  Hip extension 3+ 3+  Hip abduction 4+ 4+  Hip adduction    Hip internal rotation    Hip external  rotation    Knee flexion 5 5  Knee extension 5 5  Ankle dorsiflexion    Ankle plantarflexion    Ankle inversion    Ankle eversion     (Blank rows = not tested) * = pain/symptoms   FUNCTIONAL TESTS:  5 times sit to stand: 19.31 seconds without UE support, dynamic knee vaglus bilaterally Transfer STS: bilateral dynamic knee valgus, slightly crouched position in standing.   03/17/23: 5x STS - 9.08s   GAIT: Distance walked: 100 feet Assistive device utilized: None Level of assistance: Complete Independence Comments: slightly crouched gait.   TODAY'S TREATMENT:                                                                                                                              DATE:  [facility] Adult PT Treatment:                                                DATE: 03/17/23  Therapeutic Exercise: NuStep, L4-5,UE/LE x 6 min for warm up - therapist present to monitor Standing row red band 2 x 10 with ab set, wide stance, then staggered stance  Standing shoulder  extension yellow band 2 x 10 with ab set At counter: marching with ab set 3 x 10 Seated:  glute/ piriformis stretch x 15s x 2 each- 2 sets; hamstring stretch x 15s x 2 each LE Therapeutic Activity: 5 x STS  STS from standard chair with 4 sec eccentric lowering to chair x 7  Neuro re-ed Tandem gait forward/ backward 5 ft at counter,  intermittent UE support SLS with 3 way toe touch 2 x 5  [facility] Adult PT Treatment:                                                DATE: 03/15/23 NuStep, L5,UE/LE x 6 min for warm up - therapist present to monitor STS 1 x 10 Ab set with PPT 10 x 5 second holds March with ab set 3 x 10 Standing row RTB 1 x 10 with ab set Standing shoulder extension RTB 1 x 10 with ab set   [facility] Adult PT Treatment:                                                DATE: 03/03/23  Therapeutic Exercise: NuStep, L4,UE/LE x 6 min for warm up - therapist present to monitor At counter: Hip ext to toe touch 2 x10;  Hip  abdct 2 x 10;  heel/toe raises x 10; marching x 10 Seated:  glute/ piriformis stretch x 15s x 2 each- 2 sets; hamstring stretch x 15s x 2 each LE  Therapeutic Activity: STS from standard chair with 4 sec eccentric lowering to chair x 8, cues for head position and to widen feet Squat holding elevated table with cues for hip hinge x 5    [facility] Adult PT Treatment:                                                DATE:02/24/23 Therapeutic Ex:  NuStep, L4,UE/LE x 5.5 min for warm up - therapist present to monitor Standing trunk ext to neutral  UE on counter:  hip ext to touch toe x 10; hip abdct x 10; heel raises x 10 Seated:  glute/ piriformis stretch x 15s x 2 each; hamstring stretch x 15s x 2 each LE  Therapeutic Activity:  Sit to/from stand from chair with cues on hip hinge and controlled descent x  10  Self care: pt instructed in self massage to gluteals and lumbar musculature; returned demo with cues    [facility] Adult PT Treatment:                                                DATE:02/21/23  NuStep, L3, LE only x 5 min for warm up - therapist present to monitor Standing trunk ext to neutral  UE on counter:  hip ext to touch toe 2 x 5 Reviewed body mechanics for sit to/from supine via log roll; pt returned demo Bridge  2 x 10  - 2nd set with  3 sec hold in ext Hooklying: glute /piriformis stretch x20s x 2 each LE;  hamstring stretch with strap x 20s x 2 each LE;  90/90 nerve glides x 10 each LE  PATIENT EDUCATION:  Education details: reviewed/updated HEP Person educated: Patient Education method: Programmer, multimedia, Facilities manager, and Handouts Education comprehension: verbalized understanding, returned demonstration, verbal cues required, and tactile cues required  HOME EXERCISE PROGRAM: Access Code: [email or web address] URL: [email or web address]  ASSESSMENT:  CLINICAL IMPRESSION: Pt completed 5x STS in 9.08s; has met LTG 4. Pt reporting 20% improvement in symptoms; progressing towards  STG 2.  Good tolerance for standing exercises today, reporting fatigue but no increase in pain.  Patient will continue to benefit from physical therapy in order to improve function and reduce impairment.    From initial evaluation:  Patient a 83 y.o. y.o. female who was seen today for physical therapy evaluation and treatment for lumbosacral radiculopathy. Patient presents with pain limited deficits in lumbar spine and LE strength, ROM, endurance, activity tolerance, and functional mobility with ADL. Patient is having to modify and restrict ADL as indicated by outcome measure score as well as subjective information and objective measures which is affecting overall participation. Patient will benefit from skilled physical therapy in order to improve function and reduce impairment.  OBJECTIVE IMPAIRMENTS: decreased activity tolerance, decreased endurance, decreased mobility, decreased ROM, decreased strength, increased muscle spasms, impaired flexibility, impaired sensation, improper body mechanics, postural dysfunction, and pain.   ACTIVITY LIMITATIONS: carrying, lifting, bending, standing, squatting, stairs, transfers, bed mobility, locomotion level, and caring for others  PARTICIPATION LIMITATIONS: meal prep, cleaning, laundry, shopping, community activity, yard work, and sleeping  PERSONAL FACTORS: Age, Time since onset of injury/illness/exacerbation, and 1-2 comorbidities: HTN, HLD  are also affecting patient's functional outcome.   REHAB POTENTIAL: Good  CLINICAL DECISION MAKING: Stable/uncomplicated  EVALUATION COMPLEXITY: Low   GOALS: Goals reviewed with patient? Yes  SHORT TERM GOALS: Target date: 03/14/2023    Patient will be independent with HEP in order to improve functional outcomes. Baseline:  Goal status: INITIAL  2.  Patient will report at least 25% improvement in symptoms for improved quality of life. Baseline: 15-20% improvement - 03/17/23 Goal status:In progress      LONG TERM GOALS: Target date: 04/11/2023    Patient will report at least 75% improvement in symptoms for improved quality of life. Baseline:  Goal status: INITIAL  2.  Patient will improve modified Oswestry to less than 2% in order to indicate improved tolerance to activity. Baseline:  Goal status: INITIAL  3.  Patient will demonstrate grade of 5/5 MMT grade in all tested musculature as evidence of improved strength to assist with stair ambulation and gait.   Baseline:  Goal status: INITIAL  4.  Patient will be able to complete 5x STS in under 11.4 seconds in order to demonstrate improved functional strength. Baseline:  Goal status: INITIAL  5.  Patient will report improved sleep for improved quality of life.  Baseline:  Goal status: INITIAL    PLAN:  PT FREQUENCY: 1-2x/week  PT DURATION: 8 weeks  PLANNED INTERVENTIONS: 97164- PT Re-evaluation, 97110-Therapeutic exercises, 97530- Therapeutic activity, 97112- Neuromuscular re-education, 97535- Self Care, 78295- Manual therapy, [phone]- Gait training, [phone]- Orthotic Fit/training, [phone]- Canalith repositioning, [id number]- Aquatic Therapy, [phone]- Splinting, Patient/Family education, Balance training, Stair training, Taping, Dry Needling, Joint mobilization, Joint manipulation, Spinal manipulation, Spinal mobilization, Scar mobilization, and DME instructions.  PLAN FOR NEXT SESSION: f/u with HEP, core and glute strength  Mayer Camel, PTA 03/17/23 12:28 PM  [facility] [address] [place], [place], [address] Phone: [phone]   Fax:  [phone]

## 2023-03-21 ENCOUNTER — Encounter (HOSPITAL_BASED_OUTPATIENT_CLINIC_OR_DEPARTMENT_OTHER): Payer: Self-pay | Admitting: Physical Therapy

## 2023-03-21 ENCOUNTER — Ambulatory Visit (HOSPITAL_BASED_OUTPATIENT_CLINIC_OR_DEPARTMENT_OTHER): Payer: Medicare HMO | Attending: Neurology | Admitting: Physical Therapy

## 2023-03-21 DIAGNOSIS — R29898 Other symptoms and signs involving the musculoskeletal system: Secondary | ICD-10-CM | POA: Diagnosis not present

## 2023-03-21 DIAGNOSIS — M6281 Muscle weakness (generalized): Secondary | ICD-10-CM | POA: Diagnosis not present

## 2023-03-21 DIAGNOSIS — R2689 Other abnormalities of gait and mobility: Secondary | ICD-10-CM

## 2023-03-21 DIAGNOSIS — M5459 Other low back pain: Secondary | ICD-10-CM | POA: Diagnosis not present

## 2023-03-21 NOTE — Therapy (Signed)
 OUTPATIENT PHYSICAL THERAPY THORACOLUMBAR TREATMENT   Patient Name: Melissa Tapia MRN: [id number] DOB:17-Jun-1940, 83 y.o., female Today's Date: 03/21/2023  END OF SESSION:  PT End of Session - 03/21/23 1350     Visit Number 7    Number of Visits 16    Date for PT Re-Evaluation 04/11/23    Authorization Type [employer] Medicare    PT Start Time 1348    PT Stop Time 1428    PT Time Calculation (min) 40 min    Behavior During Therapy WFL for tasks assessed/performed             Past Medical History:  Diagnosis Date   Hypercholesteremia    Hypertension    History reviewed. No pertinent surgical history. There are no active problems to display for this patient.   PCP: Rodrigo Ran MD  REFERRING PROVIDER: Glendale Chard, DO  REFERRING DIAG: [phone] (ICD-10-CM) - Lumbosacral radiculopathy  Rationale for Evaluation and Treatment: Rehabilitation  THERAPY DIAG:  Other low back pain  Muscle weakness (generalized)  Other abnormalities of gait and mobility  Other symptoms and signs involving the musculoskeletal system  ONSET DATE: October/November 2024  SUBJECTIVE:                                                                                                                                                                                           SUBJECTIVE STATEMENT: Patient states some soreness in glutes at night. Been doing ab sets.    From initial evaluation:  Patient states some numbness in her feet. In 2021/2022, her sciatic nerve was bothering her and she went to a chiropractor which seemed to help. Has not had any issues since until November 2024. Back bothers her with lifting and was with decorating at the holidays. During the day it does no bother her unless lifting, vacuuming. At night, she can sleep several hours without issues but it bothers her after at 4-5 hours, she then ices it and it seems to help but then notes numbness in feet. Tries walking a bit and that  helps which allows her to return to sleep. Then wakes up early morning with same situation. Numbness bilaterally at night with L>R. Sleeps on sides, mostly on L but wakes up on R.   PERTINENT HISTORY:  HTN, HLD  PAIN:  Are you having pain? no NPRS:   Location: Description:   PRECAUTIONS: None  WEIGHT BEARING RESTRICTIONS: No  FALLS:  Has patient fallen in last 6 months? No  PLOF: Independent  PATIENT GOALS: sleep through the night and no numbness in her feet   OBJECTIVE: (objective measures from  initial evaluation unless otherwise dated)  PATIENT SURVEYS:  Modified Oswestry 4%   SCREENING FOR RED FLAGS: Bowel or bladder incontinence: No Spinal tumors: No Cauda equina syndrome: No Compression fracture: No Abdominal aneurysm: No  COGNITION: Overall cognitive status: Within functional limits for tasks assessed     SENSATION: WFL   POSTURE: rounded shoulders and forward head  PALPATION: TTP L piriformis  LUMBAR ROM:   AROM eval  Flexion 0% limited  Extension 25% limited  Right lateral flexion 0% limited  Left lateral flexion 0% limited  Right rotation   Left rotation    (Blank rows = not tested) * = pain/symptoms  LOWER EXTREMITY ROM:   bilateral hip extension limited, slightly crouched in standing  Active  Right eval Left eval  Hip flexion    Hip extension    Hip abduction    Hip adduction    Hip internal rotation    Hip external rotation    Knee flexion    Knee extension    Ankle dorsiflexion    Ankle plantarflexion    Ankle inversion    Ankle eversion     (Blank rows = not tested) * = pain/symptoms  LOWER EXTREMITY MMT:    MMT Right eval Left eval  Hip flexion 5 5  Hip extension 3+ 3+  Hip abduction 4+ 4+  Hip adduction    Hip internal rotation    Hip external rotation    Knee flexion 5 5  Knee extension 5 5  Ankle dorsiflexion    Ankle plantarflexion    Ankle inversion    Ankle eversion     (Blank rows = not tested) * =  pain/symptoms   FUNCTIONAL TESTS:  5 times sit to stand: 19.31 seconds without UE support, dynamic knee vaglus bilaterally Transfer STS: bilateral dynamic knee valgus, slightly crouched position in standing.   03/17/23: 5x STS - 9.08s   GAIT: Distance walked: 100 feet Assistive device utilized: None Level of assistance: Complete Independence Comments: slightly crouched gait.   TODAY'S TREATMENT:                                                                                                                              DATE:  03/21/23 Supine knee to chest stretch 3 x 20 second holds DKTC with heels on swiss ball with TRA engagement  10 x 5 second holds Standing row RTB 2 x 10 Standing shoulder extension yellow band 2 x 10 with ab set Palof press YTB 2 x 10 Step up 4 inch 1 x 10   [facility] Adult PT Treatment:                                                DATE: 03/17/23  Therapeutic Exercise: NuStep, L4-5,UE/LE x 6 min for warm up -  therapist present to monitor Standing row red band 2 x 10 with ab set, wide stance, then staggered stance  Standing shoulder extension yellow band 2 x 10 with ab set At counter: marching with ab set 3 x 10 Seated:  glute/ piriformis stretch x 15s x 2 each- 2 sets; hamstring stretch x 15s x 2 each LE Therapeutic Activity: 5 x STS  STS from standard chair with 4 sec eccentric lowering to chair x 7  Neuro re-ed Tandem gait forward/ backward 5 ft at counter,  intermittent UE support SLS with 3 way toe touch 2 x 5  [facility] Adult PT Treatment:                                                DATE: 03/15/23 NuStep, L5,UE/LE x 6 min for warm up - therapist present to monitor STS 1 x 10 Ab set with PPT 10 x 5 second holds March with ab set 3 x 10 Standing row RTB 1 x 10 with ab set Standing shoulder extension RTB 1 x 10 with ab set   [facility] Adult PT Treatment:                                                DATE: 03/03/23  Therapeutic Exercise: NuStep, L4,UE/LE x  6 min for warm up - therapist present to monitor At counter: Hip ext to toe touch 2 x10;  Hip abdct 2 x 10;  heel/toe raises x 10; marching x 10 Seated:  glute/ piriformis stretch x 15s x 2 each- 2 sets; hamstring stretch x 15s x 2 each LE  Therapeutic Activity: STS from standard chair with 4 sec eccentric lowering to chair x 8, cues for head position and to widen feet Squat holding elevated table with cues for hip hinge x 5    [facility] Adult PT Treatment:                                                DATE:02/24/23 Therapeutic Ex:  NuStep, L4,UE/LE x 5.5 min for warm up - therapist present to monitor Standing trunk ext to neutral  UE on counter:  hip ext to touch toe x 10; hip abdct x 10; heel raises x 10 Seated:  glute/ piriformis stretch x 15s x 2 each; hamstring stretch x 15s x 2 each LE  Therapeutic Activity:  Sit to/from stand from chair with cues on hip hinge and controlled descent x  10  Self care: pt instructed in self massage to gluteals and lumbar musculature; returned demo with cues    [facility] Adult PT Treatment:                                                DATE:02/21/23  NuStep, L3, LE only x 5 min for warm up - therapist present to monitor Standing trunk ext to neutral  UE on counter:  hip ext to touch toe 2 x 5 Reviewed  body mechanics for sit to/from supine via log roll; pt returned demo Bridge  2 x 10  - 2nd set with 3 sec hold in ext Hooklying: glute /piriformis stretch x20s x 2 each LE;  hamstring stretch with strap x 20s x 2 each LE;  90/90 nerve glides x 10 each LE  PATIENT EDUCATION:  Education details: reviewed/updated HEP Person educated: Patient Education method: Programmer, multimedia, Facilities manager, and Handouts Education comprehension: verbalized understanding, returned demonstration, verbal cues required, and tactile cues required  HOME EXERCISE PROGRAM: Access Code: [email or web address] URL: [email or web address]  ASSESSMENT:  CLINICAL IMPRESSION: Patient shown  stretch for glutes that will likely help soreness at night. Continued with core and postural strengthening.  Patient will continue to benefit from physical therapy in order to improve function and reduce impairment.    From initial evaluation:  Patient a 83 y.o. y.o. female who was seen today for physical therapy evaluation and treatment for lumbosacral radiculopathy. Patient presents with pain limited deficits in lumbar spine and LE strength, ROM, endurance, activity tolerance, and functional mobility with ADL. Patient is having to modify and restrict ADL as indicated by outcome measure score as well as subjective information and objective measures which is affecting overall participation. Patient will benefit from skilled physical therapy in order to improve function and reduce impairment.  OBJECTIVE IMPAIRMENTS: decreased activity tolerance, decreased endurance, decreased mobility, decreased ROM, decreased strength, increased muscle spasms, impaired flexibility, impaired sensation, improper body mechanics, postural dysfunction, and pain.   ACTIVITY LIMITATIONS: carrying, lifting, bending, standing, squatting, stairs, transfers, bed mobility, locomotion level, and caring for others  PARTICIPATION LIMITATIONS: meal prep, cleaning, laundry, shopping, community activity, yard work, and sleeping  PERSONAL FACTORS: Age, Time since onset of injury/illness/exacerbation, and 1-2 comorbidities: HTN, HLD  are also affecting patient's functional outcome.   REHAB POTENTIAL: Good  CLINICAL DECISION MAKING: Stable/uncomplicated  EVALUATION COMPLEXITY: Low   GOALS: Goals reviewed with patient? Yes  SHORT TERM GOALS: Target date: 03/14/2023    Patient will be independent with HEP in order to improve functional outcomes. Baseline:  Goal status: INITIAL  2.  Patient will report at least 25% improvement in symptoms for improved quality of life. Baseline: 15-20% improvement - 03/17/23 Goal status:In  progress     LONG TERM GOALS: Target date: 04/11/2023    Patient will report at least 75% improvement in symptoms for improved quality of life. Baseline:  Goal status: INITIAL  2.  Patient will improve modified Oswestry to less than 2% in order to indicate improved tolerance to activity. Baseline:  Goal status: INITIAL  3.  Patient will demonstrate grade of 5/5 MMT grade in all tested musculature as evidence of improved strength to assist with stair ambulation and gait.   Baseline:  Goal status: INITIAL  4.  Patient will be able to complete 5x STS in under 11.4 seconds in order to demonstrate improved functional strength. Baseline:  Goal status: INITIAL  5.  Patient will report improved sleep for improved quality of life.  Baseline:  Goal status: INITIAL    PLAN:  PT FREQUENCY: 1-2x/week  PT DURATION: 8 weeks  PLANNED INTERVENTIONS: 97164- PT Re-evaluation, 97110-Therapeutic exercises, 97530- Therapeutic activity, 97112- Neuromuscular re-education, 97535- Self Care, 62831- Manual therapy, [phone]- Gait training, [phone]- Orthotic Fit/training, [phone]- Canalith repositioning, [id number]- Aquatic Therapy, [phone]- Splinting, Patient/Family education, Balance training, Stair training, Taping, Dry Needling, Joint mobilization, Joint manipulation, Spinal manipulation, Spinal mobilization, Scar mobilization, and DME instructions.  PLAN FOR NEXT SESSION: f/u with HEP, core and glute strength  Wyman Songster, PT 03/21/2023, 2:31 PM  [facility] [address] [place], [place], [address] Phone: [phone]   Fax:  [phone]

## 2023-03-24 ENCOUNTER — Ambulatory Visit (HOSPITAL_BASED_OUTPATIENT_CLINIC_OR_DEPARTMENT_OTHER): Payer: Medicare HMO | Admitting: Physical Therapy

## 2023-03-24 ENCOUNTER — Encounter (HOSPITAL_BASED_OUTPATIENT_CLINIC_OR_DEPARTMENT_OTHER): Payer: Self-pay | Admitting: Physical Therapy

## 2023-03-24 DIAGNOSIS — R2689 Other abnormalities of gait and mobility: Secondary | ICD-10-CM

## 2023-03-24 DIAGNOSIS — M5459 Other low back pain: Secondary | ICD-10-CM | POA: Diagnosis not present

## 2023-03-24 DIAGNOSIS — R29898 Other symptoms and signs involving the musculoskeletal system: Secondary | ICD-10-CM | POA: Diagnosis not present

## 2023-03-24 DIAGNOSIS — M6281 Muscle weakness (generalized): Secondary | ICD-10-CM | POA: Diagnosis not present

## 2023-03-24 NOTE — Therapy (Signed)
 OUTPATIENT PHYSICAL THERAPY THORACOLUMBAR TREATMENT   Patient Name: Melissa Tapia MRN: [id number] DOB:1940/07/23, 83 y.o., female Today's Date: 03/24/2023  END OF SESSION:  PT End of Session - 03/24/23 1153     Visit Number 8    Number of Visits 16    Date for PT Re-Evaluation 04/11/23    Authorization Type [employer]    PT Start Time 1151    PT Stop Time 1233    PT Time Calculation (min) 42 min    Activity Tolerance Patient tolerated treatment well    Behavior During Therapy WFL for tasks assessed/performed             Past Medical History:  Diagnosis Date   Hypercholesteremia    Hypertension    History reviewed. No pertinent surgical history. There are no active problems to display for this patient.   PCP: Rodrigo Ran MD  REFERRING PROVIDER: Glendale Chard, DO  REFERRING DIAG: [phone] (ICD-10-CM) - Lumbosacral radiculopathy  Rationale for Evaluation and Treatment: Rehabilitation  THERAPY DIAG:  Other low back pain  Muscle weakness (generalized)  Other abnormalities of gait and mobility  Other symptoms and signs involving the musculoskeletal system  ONSET DATE: October/November 2024  SUBJECTIVE:                                                                                                                                                                                           SUBJECTIVE STATEMENT: Patient states some soreness in glutes at night. Been doing ab sets.    From initial evaluation:  Patient states some numbness in her feet. In 2021/2022, her sciatic nerve was bothering her and she went to a chiropractor which seemed to help. Has not had any issues since until November 2024. Back bothers her with lifting and was with decorating at the holidays. During the day it does no bother her unless lifting, vacuuming. At night, she can sleep several hours without issues but it bothers her after at 4-5 hours, she then ices it and it seems to help but  then notes numbness in feet. Tries walking a bit and that helps which allows her to return to sleep. Then wakes up early morning with same situation. Numbness bilaterally at night with L>R. Sleeps on sides, mostly on L but wakes up on R.   PERTINENT HISTORY:  HTN, HLD  PAIN:  Are you having pain? no NPRS:   Location: Description:   PRECAUTIONS: None  WEIGHT BEARING RESTRICTIONS: No  FALLS:  Has patient fallen in last 6 months? No  PLOF: Independent  PATIENT GOALS: sleep through the night and no numbness  in her feet   OBJECTIVE: (objective measures from initial evaluation unless otherwise dated)  PATIENT SURVEYS:  Modified Oswestry 4%   SCREENING FOR RED FLAGS: Bowel or bladder incontinence: No Spinal tumors: No Cauda equina syndrome: No Compression fracture: No Abdominal aneurysm: No  COGNITION: Overall cognitive status: Within functional limits for tasks assessed     SENSATION: WFL   POSTURE: rounded shoulders and forward head  PALPATION: TTP L piriformis  LUMBAR ROM:   AROM eval  Flexion 0% limited  Extension 25% limited  Right lateral flexion 0% limited  Left lateral flexion 0% limited  Right rotation   Left rotation    (Blank rows = not tested) * = pain/symptoms  LOWER EXTREMITY ROM:   bilateral hip extension limited, slightly crouched in standing  Active  Right eval Left eval  Hip flexion    Hip extension    Hip abduction    Hip adduction    Hip internal rotation    Hip external rotation    Knee flexion    Knee extension    Ankle dorsiflexion    Ankle plantarflexion    Ankle inversion    Ankle eversion     (Blank rows = not tested) * = pain/symptoms  LOWER EXTREMITY MMT:    MMT Right eval Left eval  Hip flexion 5 5  Hip extension 3+ 3+  Hip abduction 4+ 4+  Hip adduction    Hip internal rotation    Hip external rotation    Knee flexion 5 5  Knee extension 5 5  Ankle dorsiflexion    Ankle plantarflexion    Ankle  inversion    Ankle eversion     (Blank rows = not tested) * = pain/symptoms   FUNCTIONAL TESTS:  5 times sit to stand: 19.31 seconds without UE support, dynamic knee vaglus bilaterally Transfer STS: bilateral dynamic knee valgus, slightly crouched position in standing.   03/17/23: 5x STS - 9.08s   GAIT: Distance walked: 100 feet Assistive device utilized: None Level of assistance: Complete Independence Comments: slightly crouched gait.   TODAY'S TREATMENT:                                                                                                                              DATE:  03/24/23 NuStep, L4-5,UE/LE x 6 min for warm up Calf stretch 3 x 20 second holds bilateral Supine active hamstring stretch 2 x 20 second holds Sidelying clam RTB 2 x 10   03/21/23 Supine knee to chest stretch 3 x 20 second holds DKTC with heels on swiss ball with TRA engagement  10 x 5 second holds Standing row RTB 2 x 10 Standing shoulder extension yellow band 2 x 10 with ab set Palof press YTB 2 x 10 Step up 4 inch 1 x 10   [facility] Adult PT Treatment:  DATE: 03/17/23  Therapeutic Exercise: NuStep, L4-5,UE/LE x 6 min for warm up - therapist present to monitor Standing row red band 2 x 10 with ab set, wide stance, then staggered stance  Standing shoulder extension yellow band 2 x 10 with ab set At counter: marching with ab set 3 x 10 Seated:  glute/ piriformis stretch x 15s x 2 each- 2 sets; hamstring stretch x 15s x 2 each LE Therapeutic Activity: 5 x STS  STS from standard chair with 4 sec eccentric lowering to chair x 7  Neuro re-ed Tandem gait forward/ backward 5 ft at counter,  intermittent UE support SLS with 3 way toe touch 2 x 5  [facility] Adult PT Treatment:                                                DATE: 03/15/23 NuStep, L5,UE/LE x 6 min for warm up - therapist present to monitor STS 1 x 10 Ab set with PPT 10 x 5 second holds March with  ab set 3 x 10 Standing row RTB 1 x 10 with ab set Standing shoulder extension RTB 1 x 10 with ab set   [facility] Adult PT Treatment:                                                DATE: 03/03/23  Therapeutic Exercise: NuStep, L4,UE/LE x 6 min for warm up - therapist present to monitor At counter: Hip ext to toe touch 2 x10;  Hip abdct 2 x 10;  heel/toe raises x 10; marching x 10 Seated:  glute/ piriformis stretch x 15s x 2 each- 2 sets; hamstring stretch x 15s x 2 each LE  Therapeutic Activity: STS from standard chair with 4 sec eccentric lowering to chair x 8, cues for head position and to widen feet Squat holding elevated table with cues for hip hinge x 5    [facility] Adult PT Treatment:                                                DATE:02/24/23 Therapeutic Ex:  NuStep, L4,UE/LE x 5.5 min for warm up - therapist present to monitor Standing trunk ext to neutral  UE on counter:  hip ext to touch toe x 10; hip abdct x 10; heel raises x 10 Seated:  glute/ piriformis stretch x 15s x 2 each; hamstring stretch x 15s x 2 each LE  Therapeutic Activity:  Sit to/from stand from chair with cues on hip hinge and controlled descent x  10  Self care: pt instructed in self massage to gluteals and lumbar musculature; returned demo with cues    [facility] Adult PT Treatment:                                                DATE:02/21/23  NuStep, L3, LE only x 5 min for warm up - therapist present to monitor Standing trunk ext to neutral  UE on counter:  hip ext to touch toe 2 x 5 Reviewed body mechanics for sit to/from supine via log roll; pt returned demo Bridge  2 x 10  - 2nd set with 3 sec hold in ext Hooklying: glute /piriformis stretch x20s x 2 each LE;  hamstring stretch with strap x 20s x 2 each LE;  90/90 nerve glides x 10 each LE  PATIENT EDUCATION:  Education details: reviewed/updated HEP Person educated: Patient Education method: Programmer, multimedia, Facilities manager, and Handouts Education comprehension:  verbalized understanding, returned demonstration, verbal cues required, and tactile cues required  HOME EXERCISE PROGRAM: Access Code: [email or web address] URL: [email or web address]  ASSESSMENT:  CLINICAL IMPRESSION: Continued with LE LE mobility and strengthening exercises. Educated on exercise technique and purpose throughout session. Patient will continue to benefit from physical therapy in order to improve function and reduce impairment.    From initial evaluation:  Patient a 83 y.o. y.o. female who was seen today for physical therapy evaluation and treatment for lumbosacral radiculopathy. Patient presents with pain limited deficits in lumbar spine and LE strength, ROM, endurance, activity tolerance, and functional mobility with ADL. Patient is having to modify and restrict ADL as indicated by outcome measure score as well as subjective information and objective measures which is affecting overall participation. Patient will benefit from skilled physical therapy in order to improve function and reduce impairment.  OBJECTIVE IMPAIRMENTS: decreased activity tolerance, decreased endurance, decreased mobility, decreased ROM, decreased strength, increased muscle spasms, impaired flexibility, impaired sensation, improper body mechanics, postural dysfunction, and pain.   ACTIVITY LIMITATIONS: carrying, lifting, bending, standing, squatting, stairs, transfers, bed mobility, locomotion level, and caring for others  PARTICIPATION LIMITATIONS: meal prep, cleaning, laundry, shopping, community activity, yard work, and sleeping  PERSONAL FACTORS: Age, Time since onset of injury/illness/exacerbation, and 1-2 comorbidities: HTN, HLD  are also affecting patient's functional outcome.   REHAB POTENTIAL: Good  CLINICAL DECISION MAKING: Stable/uncomplicated  EVALUATION COMPLEXITY: Low   GOALS: Goals reviewed with patient? Yes  SHORT TERM GOALS: Target date: 03/14/2023    Patient will be  independent with HEP in order to improve functional outcomes. Baseline:  Goal status: INITIAL  2.  Patient will report at least 25% improvement in symptoms for improved quality of life. Baseline: 15-20% improvement - 03/17/23 Goal status:In progress     LONG TERM GOALS: Target date: 04/11/2023    Patient will report at least 75% improvement in symptoms for improved quality of life. Baseline:  Goal status: INITIAL  2.  Patient will improve modified Oswestry to less than 2% in order to indicate improved tolerance to activity. Baseline:  Goal status: INITIAL  3.  Patient will demonstrate grade of 5/5 MMT grade in all tested musculature as evidence of improved strength to assist with stair ambulation and gait.   Baseline:  Goal status: INITIAL  4.  Patient will be able to complete 5x STS in under 11.4 seconds in order to demonstrate improved functional strength. Baseline:  Goal status: INITIAL  5.  Patient will report improved sleep for improved quality of life.  Baseline:  Goal status: INITIAL    PLAN:  PT FREQUENCY: 1-2x/week  PT DURATION: 8 weeks  PLANNED INTERVENTIONS: 97164- PT Re-evaluation, 97110-Therapeutic exercises, 97530- Therapeutic activity, 97112- Neuromuscular re-education, 97535- Self Care, [address]- Manual therapy, [phone]- Gait training, [phone]- Orthotic Fit/training, [phone]- Canalith repositioning, [id number]- Aquatic Therapy, [phone]- Splinting, Patient/Family education, Balance training, Stair training, Taping, Dry Needling, Joint mobilization, Joint manipulation, Spinal manipulation, Spinal mobilization, Scar mobilization, and DME instructions.  PLAN FOR  NEXT SESSION: f/u with HEP, core and glute strength   Wyman Songster, PT 03/24/2023, 12:36 PM  [facility] [address] [place], [place], [address] Phone: [phone]   Fax:  [phone]

## 2023-03-27 ENCOUNTER — Telehealth: Payer: Self-pay | Admitting: Internal Medicine

## 2023-03-27 DIAGNOSIS — J849 Interstitial pulmonary disease, unspecified: Secondary | ICD-10-CM

## 2023-03-27 NOTE — Telephone Encounter (Signed)
 Patient needs ct

## 2023-03-27 NOTE — Telephone Encounter (Signed)
 Ct order has been placed. Sending to MR.

## 2023-03-27 NOTE — Telephone Encounter (Signed)
 I do not see and order for a ct placed please advise

## 2023-03-28 DIAGNOSIS — D1801 Hemangioma of skin and subcutaneous tissue: Secondary | ICD-10-CM | POA: Diagnosis not present

## 2023-03-28 DIAGNOSIS — L821 Other seborrheic keratosis: Secondary | ICD-10-CM | POA: Diagnosis not present

## 2023-03-29 ENCOUNTER — Ambulatory Visit (HOSPITAL_BASED_OUTPATIENT_CLINIC_OR_DEPARTMENT_OTHER): Payer: Medicare HMO | Admitting: Physical Therapy

## 2023-03-29 ENCOUNTER — Encounter (HOSPITAL_BASED_OUTPATIENT_CLINIC_OR_DEPARTMENT_OTHER): Payer: Self-pay | Admitting: Physical Therapy

## 2023-03-29 DIAGNOSIS — M5459 Other low back pain: Secondary | ICD-10-CM | POA: Diagnosis not present

## 2023-03-29 DIAGNOSIS — R29898 Other symptoms and signs involving the musculoskeletal system: Secondary | ICD-10-CM

## 2023-03-29 DIAGNOSIS — M6281 Muscle weakness (generalized): Secondary | ICD-10-CM | POA: Diagnosis not present

## 2023-03-29 DIAGNOSIS — R2689 Other abnormalities of gait and mobility: Secondary | ICD-10-CM | POA: Diagnosis not present

## 2023-03-29 NOTE — Therapy (Signed)
 OUTPATIENT PHYSICAL THERAPY THORACOLUMBAR TREATMENT   Patient Name: Melissa Tapia MRN: [id number] DOB:Oct 24, 1940, 83 y.o., female Today's Date: 03/29/2023  END OF SESSION:  PT End of Session - 03/29/23 1302     Visit Number 9    Number of Visits 16    Date for PT Re-Evaluation 04/11/23    Authorization Type [employer] Medicare    PT Start Time 1302    PT Stop Time 1342    PT Time Calculation (min) 40 min    Activity Tolerance Patient tolerated treatment well    Behavior During Therapy WFL for tasks assessed/performed             Past Medical History:  Diagnosis Date   Hypercholesteremia    Hypertension    History reviewed. No pertinent surgical history. There are no active problems to display for this patient.   PCP: Rodrigo Ran MD  REFERRING PROVIDER: Glendale Chard, DO  REFERRING DIAG: [id number] (ICD-10-CM) - Lumbosacral radiculopathy  Rationale for Evaluation and Treatment: Rehabilitation  THERAPY DIAG:  Other low back pain  Muscle weakness (generalized)  Other abnormalities of gait and mobility  Other symptoms and signs involving the musculoskeletal system  ONSET DATE: October/November 2024  SUBJECTIVE:                                                                                                                                                                                           SUBJECTIVE STATEMENT: Patient states some back soreness and calf soreness. Maybe irritated from calf stretch. Doing HEP.   From initial evaluation:  Patient states some numbness in her feet. In 2021/2022, her sciatic nerve was bothering her and she went to a chiropractor which seemed to help. Has not had any issues since until November 2024. Back bothers her with lifting and was with decorating at the holidays. During the day it does no bother her unless lifting, vacuuming. At night, she can sleep several hours without issues but it bothers her after at 4-5 hours, she then ices  it and it seems to help but then notes numbness in feet. Tries walking a bit and that helps which allows her to return to sleep. Then wakes up early morning with same situation. Numbness bilaterally at night with L>R. Sleeps on sides, mostly on L but wakes up on R.   PERTINENT HISTORY:  HTN, HLD  PAIN:  Are you having pain? no NPRS:   Location: Description:   PRECAUTIONS: None  WEIGHT BEARING RESTRICTIONS: No  FALLS:  Has patient fallen in last 6 months? No  PLOF: Independent  PATIENT GOALS: sleep through the night and  no numbness in her feet   OBJECTIVE: (objective measures from initial evaluation unless otherwise dated)  PATIENT SURVEYS:  Modified Oswestry 4%   SCREENING FOR RED FLAGS: Bowel or bladder incontinence: No Spinal tumors: No Cauda equina syndrome: No Compression fracture: No Abdominal aneurysm: No  COGNITION: Overall cognitive status: Within functional limits for tasks assessed     SENSATION: WFL   POSTURE: rounded shoulders and forward head  PALPATION: TTP L piriformis  LUMBAR ROM:   AROM eval  Flexion 0% limited  Extension 25% limited  Right lateral flexion 0% limited  Left lateral flexion 0% limited  Right rotation   Left rotation    (Blank rows = not tested) * = pain/symptoms  LOWER EXTREMITY ROM:   bilateral hip extension limited, slightly crouched in standing  Active  Right eval Left eval  Hip flexion    Hip extension    Hip abduction    Hip adduction    Hip internal rotation    Hip external rotation    Knee flexion    Knee extension    Ankle dorsiflexion    Ankle plantarflexion    Ankle inversion    Ankle eversion     (Blank rows = not tested) * = pain/symptoms  LOWER EXTREMITY MMT:    MMT Right eval Left eval  Hip flexion 5 5  Hip extension 3+ 3+  Hip abduction 4+ 4+  Hip adduction    Hip internal rotation    Hip external rotation    Knee flexion 5 5  Knee extension 5 5  Ankle dorsiflexion    Ankle  plantarflexion    Ankle inversion    Ankle eversion     (Blank rows = not tested) * = pain/symptoms   FUNCTIONAL TESTS:  5 times sit to stand: 19.31 seconds without UE support, dynamic knee vaglus bilaterally Transfer STS: bilateral dynamic knee valgus, slightly crouched position in standing.   03/17/23: 5x STS - 9.08s   GAIT: Distance walked: 100 feet Assistive device utilized: None Level of assistance: Complete Independence Comments: slightly crouched gait.   TODAY'S TREATMENT:                                                                                                                              DATE:  03/29/23 Calf stretch 3 x 20 second holds bilateral Review of HEP Supine active hamstring stretch 2 x 20 second holds Sidelying clam RTB 2 x 10 STS from elevated table 1 x 10  Seated trunk flexion stretch 3 way 5 x 10 second holds each LAQ 5 x 5 second holds   03/24/23 NuStep, L4-5,UE/LE x 6 min for warm up Calf stretch 3 x 20 second holds bilateral Supine active hamstring stretch 2 x 20 second holds Sidelying clam RTB 2 x 10   03/21/23 Supine knee to chest stretch 3 x 20 second holds DKTC with heels on swiss ball with TRA engagement  10 x  5 second holds Standing row RTB 2 x 10 Standing shoulder extension yellow band 2 x 10 with ab set Palof press YTB 2 x 10 Step up 4 inch 1 x 10   [facility] Adult PT Treatment:                                                DATE: 03/17/23  Therapeutic Exercise: NuStep, L4-5,UE/LE x 6 min for warm up - therapist present to monitor Standing row red band 2 x 10 with ab set, wide stance, then staggered stance  Standing shoulder extension yellow band 2 x 10 with ab set At counter: marching with ab set 3 x 10 Seated:  glute/ piriformis stretch x 15s x 2 each- 2 sets; hamstring stretch x 15s x 2 each LE Therapeutic Activity: 5 x STS  STS from standard chair with 4 sec eccentric lowering to chair x 7  Neuro re-ed Tandem gait forward/  backward 5 ft at counter,  intermittent UE support SLS with 3 way toe touch 2 x 5  [facility] Adult PT Treatment:                                                DATE: 03/15/23 NuStep, L5,UE/LE x 6 min for warm up - therapist present to monitor STS 1 x 10 Ab set with PPT 10 x 5 second holds March with ab set 3 x 10 Standing row RTB 1 x 10 with ab set Standing shoulder extension RTB 1 x 10 with ab set   [facility] Adult PT Treatment:                                                DATE: 03/03/23  Therapeutic Exercise: NuStep, L4,UE/LE x 6 min for warm up - therapist present to monitor At counter: Hip ext to toe touch 2 x10;  Hip abdct 2 x 10;  heel/toe raises x 10; marching x 10 Seated:  glute/ piriformis stretch x 15s x 2 each- 2 sets; hamstring stretch x 15s x 2 each LE  Therapeutic Activity: STS from standard chair with 4 sec eccentric lowering to chair x 8, cues for head position and to widen feet Squat holding elevated table with cues for hip hinge x 5    [facility] Adult PT Treatment:                                                DATE:02/24/23 Therapeutic Ex:  NuStep, L4,UE/LE x 5.5 min for warm up - therapist present to monitor Standing trunk ext to neutral  UE on counter:  hip ext to touch toe x 10; hip abdct x 10; heel raises x 10 Seated:  glute/ piriformis stretch x 15s x 2 each; hamstring stretch x 15s x 2 each LE  Therapeutic Activity:  Sit to/from stand from chair with cues on hip hinge and controlled descent x  10  Self care: pt instructed in self  massage to gluteals and lumbar musculature; returned demo with cues    [facility] Adult PT Treatment:                                                DATE:02/21/23  NuStep, L3, LE only x 5 min for warm up - therapist present to monitor Standing trunk ext to neutral  UE on counter:  hip ext to touch toe 2 x 5 Reviewed body mechanics for sit to/from supine via log roll; pt returned demo Bridge  2 x 10  - 2nd set with 3 sec hold in ext Hooklying:  glute /piriformis stretch x20s x 2 each LE;  hamstring stretch with strap x 20s x 2 each LE;  90/90 nerve glides x 10 each LE  PATIENT EDUCATION:  Education details: reviewed/updated HEP Person educated: Patient Education method: Programmer, multimedia, Facilities manager, and Handouts Education comprehension: verbalized understanding, returned demonstration, verbal cues required, and tactile cues required  HOME EXERCISE PROGRAM: Access Code: [email or web address] URL: [email or web address]  ASSESSMENT:  CLINICAL IMPRESSION: Review of previously completed exercises with good mechanics with minimal to no cueing. Educated on rationale of treatment. Patient with c/o pain in back and knees with STS at home, reviewed mechanics and avoiding compensations for home performance. Patient will continue to benefit from physical therapy in order to improve function and reduce impairment.    From initial evaluation:  Patient a 83 y.o. y.o. female who was seen today for physical therapy evaluation and treatment for lumbosacral radiculopathy. Patient presents with pain limited deficits in lumbar spine and LE strength, ROM, endurance, activity tolerance, and functional mobility with ADL. Patient is having to modify and restrict ADL as indicated by outcome measure score as well as subjective information and objective measures which is affecting overall participation. Patient will benefit from skilled physical therapy in order to improve function and reduce impairment.  OBJECTIVE IMPAIRMENTS: decreased activity tolerance, decreased endurance, decreased mobility, decreased ROM, decreased strength, increased muscle spasms, impaired flexibility, impaired sensation, improper body mechanics, postural dysfunction, and pain.   ACTIVITY LIMITATIONS: carrying, lifting, bending, standing, squatting, stairs, transfers, bed mobility, locomotion level, and caring for others  PARTICIPATION LIMITATIONS: meal prep, cleaning, laundry,  shopping, community activity, yard work, and sleeping  PERSONAL FACTORS: Age, Time since onset of injury/illness/exacerbation, and 1-2 comorbidities: HTN, HLD  are also affecting patient's functional outcome.   REHAB POTENTIAL: Good  CLINICAL DECISION MAKING: Stable/uncomplicated  EVALUATION COMPLEXITY: Low   GOALS: Goals reviewed with patient? Yes  SHORT TERM GOALS: Target date: 03/14/2023    Patient will be independent with HEP in order to improve functional outcomes. Baseline:  Goal status: INITIAL  2.  Patient will report at least 25% improvement in symptoms for improved quality of life. Baseline: 15-20% improvement - 03/17/23 Goal status:In progress     LONG TERM GOALS: Target date: 04/11/2023    Patient will report at least 75% improvement in symptoms for improved quality of life. Baseline:  Goal status: INITIAL  2.  Patient will improve modified Oswestry to less than 2% in order to indicate improved tolerance to activity. Baseline:  Goal status: INITIAL  3.  Patient will demonstrate grade of 5/5 MMT grade in all tested musculature as evidence of improved strength to assist with stair ambulation and gait.   Baseline:  Goal status: INITIAL  4.  Patient will be able to complete  5x STS in under 11.4 seconds in order to demonstrate improved functional strength. Baseline:  Goal status: INITIAL  5.  Patient will report improved sleep for improved quality of life.  Baseline:  Goal status: INITIAL    PLAN:  PT FREQUENCY: 1-2x/week  PT DURATION: 8 weeks  PLANNED INTERVENTIONS: 97164- PT Re-evaluation, 97110-Therapeutic exercises, 97530- Therapeutic activity, 97112- Neuromuscular re-education, 97535- Self Care, 62130- Manual therapy, [phone]- Gait training, [phone]- Orthotic Fit/training, [phone]- Canalith repositioning, [id number]- Aquatic Therapy, [phone]- Splinting, Patient/Family education, Balance training, Stair training, Taping, Dry Needling, Joint mobilization, Joint  manipulation, Spinal manipulation, Spinal mobilization, Scar mobilization, and DME instructions.  PLAN FOR NEXT SESSION: f/u with HEP, core and glute strength   Wyman Songster, PT 03/29/2023, 1:46 PM  [facility] [address] [place], [place], [address] Phone: [phone]   Fax:  [phone]

## 2023-03-31 ENCOUNTER — Ambulatory Visit (HOSPITAL_BASED_OUTPATIENT_CLINIC_OR_DEPARTMENT_OTHER): Payer: Medicare HMO | Admitting: Physical Therapy

## 2023-03-31 ENCOUNTER — Encounter (HOSPITAL_BASED_OUTPATIENT_CLINIC_OR_DEPARTMENT_OTHER): Payer: Self-pay | Admitting: Physical Therapy

## 2023-03-31 DIAGNOSIS — R2689 Other abnormalities of gait and mobility: Secondary | ICD-10-CM

## 2023-03-31 DIAGNOSIS — R29898 Other symptoms and signs involving the musculoskeletal system: Secondary | ICD-10-CM

## 2023-03-31 DIAGNOSIS — M6281 Muscle weakness (generalized): Secondary | ICD-10-CM

## 2023-03-31 DIAGNOSIS — M5459 Other low back pain: Secondary | ICD-10-CM | POA: Diagnosis not present

## 2023-03-31 NOTE — Therapy (Signed)
 OUTPATIENT PHYSICAL THERAPY THORACOLUMBAR TREATMENT   Patient Name: Melissa Tapia MRN: [id number] DOB:10/15/1940, 83 y.o., female Today's Date: 03/31/2023  Progress Note   Reporting Period 02/14/23 to 03/31/23   See note below for Objective Data and Assessment of Progress/Goals   END OF SESSION:  PT End of Session - 03/31/23 1152     Visit Number 10    Number of Visits 16    Date for PT Re-Evaluation 04/11/23    Authorization Type [employer]    PT Start Time 1152    PT Stop Time 1239    PT Time Calculation (min) 47 min    Activity Tolerance Patient tolerated treatment well    Behavior During Therapy [facility] for tasks assessed/performed             Past Medical History:  Diagnosis Date   Hypercholesteremia    Hypertension    History reviewed. No pertinent surgical history. There are no active problems to display for this patient.   PCP: Rodrigo Ran MD  REFERRING PROVIDER: Glendale Chard, DO  REFERRING DIAG: [phone] (ICD-10-CM) - Lumbosacral radiculopathy  Rationale for Evaluation and Treatment: Rehabilitation  THERAPY DIAG:  Other low back pain  Muscle weakness (generalized)  Other abnormalities of gait and mobility  Other symptoms and signs involving the musculoskeletal system  ONSET DATE: October/November 2024  SUBJECTIVE:                                                                                                                                                                                           SUBJECTIVE STATEMENT: Patient states has put heat on back which helped. Taking it easy the last few days which may have helped. HEP going well. No real change in knee from stopping calf stretch. Been icing knee. Patient states more soreness since starting PT in her muscles. Continues to have symptoms in feet. Been trying to engage abdominals. Sleeps on R and then during the night stiches to the L. Patient states no improvement in numbness in her feet.  Has been using pedaling machine over the last few weeks. Feels that she is getting stronger. Wears compression socks sometimes when she knows she will be on her feet a lot.   From initial evaluation:  Patient states some numbness in her feet. In 2021/2022, her sciatic nerve was bothering her and she went to a chiropractor which seemed to help. Has not had any issues since until November 2024. Back bothers her with lifting and was with decorating at the holidays. During the day it does no bother her unless lifting, vacuuming. At night, she can sleep several  hours without issues but it bothers her after at 4-5 hours, she then ices it and it seems to help but then notes numbness in feet. Tries walking a bit and that helps which allows her to return to sleep. Then wakes up early morning with same situation. Numbness bilaterally at night with L>R. Sleeps on sides, mostly on L but wakes up on R.   PERTINENT HISTORY:  HTN, HLD  PAIN:  Are you having pain? no NPRS:   Location: Description:   PRECAUTIONS: None  WEIGHT BEARING RESTRICTIONS: No  FALLS:  Has patient fallen in last 6 months? No  PLOF: Independent  PATIENT GOALS: sleep through the night and no numbness in her feet   OBJECTIVE: (objective measures from initial evaluation unless otherwise dated)  PATIENT SURVEYS:  Modified Oswestry 4%     SCREENING FOR RED FLAGS: Bowel or bladder incontinence: No Spinal tumors: No Cauda equina syndrome: No Compression fracture: No Abdominal aneurysm: No  COGNITION: Overall cognitive status: Within functional limits for tasks assessed     SENSATION: WFL   POSTURE: rounded shoulders and forward head  PALPATION: TTP L piriformis  LUMBAR ROM:   AROM eval  Flexion 0% limited  Extension 25% limited  Right lateral flexion 0% limited  Left lateral flexion 0% limited  Right rotation   Left rotation    (Blank rows = not tested) * = pain/symptoms  LOWER EXTREMITY ROM:   bilateral  hip extension limited, slightly crouched in standing  Active  Right eval Left eval  Hip flexion    Hip extension    Hip abduction    Hip adduction    Hip internal rotation    Hip external rotation    Knee flexion    Knee extension    Ankle dorsiflexion    Ankle plantarflexion    Ankle inversion    Ankle eversion     (Blank rows = not tested) * = pain/symptoms  LOWER EXTREMITY MMT:    MMT Right eval Left eval Right 03/31/23 Left 03/31/23  Hip flexion 5 5 5 5   Hip extension 3+ 3+ 4- 4-  Hip abduction 4+ 4+ 4+ 4+  Hip adduction      Hip internal rotation      Hip external rotation      Knee flexion 5 5 5 5   Knee extension 5 5 5 5   Ankle dorsiflexion   5 5  Ankle plantarflexion      Ankle inversion      Ankle eversion       (Blank rows = not tested) * = pain/symptoms   FUNCTIONAL TESTS:  5 times sit to stand: 19.31 seconds without UE support, dynamic knee vaglus bilaterally Transfer STS: bilateral dynamic knee valgus, slightly crouched position in standing.   03/17/23: 5x STS - 9.08s   GAIT: Distance walked: 100 feet Assistive device utilized: None Level of assistance: Complete Independence Comments: slightly crouched gait.   TODAY'S TREATMENT:  DATE:  03/31/23 Reassessment Discussion of symptoms/POC Good sensation L foot, no tenderness; TTP L glute max/med  03/29/23 Calf stretch 3 x 20 second holds bilateral Review of HEP Supine active hamstring stretch 2 x 20 second holds Sidelying clam RTB 2 x 10 STS from elevated table 1 x 10  Seated trunk flexion stretch 3 way 5 x 10 second holds each LAQ 5 x 5 second holds   03/24/23 NuStep, L4-5,UE/LE x 6 min for warm up Calf stretch 3 x 20 second holds bilateral Supine active hamstring stretch 2 x 20 second holds Sidelying clam RTB 2 x 10   03/21/23 Supine knee to chest stretch 3 x 20  second holds DKTC with heels on swiss ball with TRA engagement  10 x 5 second holds Standing row RTB 2 x 10 Standing shoulder extension yellow band 2 x 10 with ab set Palof press YTB 2 x 10 Step up 4 inch 1 x 10   [facility] Adult PT Treatment:                                                DATE: 03/17/23  Therapeutic Exercise: NuStep, L4-5,UE/LE x 6 min for warm up - therapist present to monitor Standing row red band 2 x 10 with ab set, wide stance, then staggered stance  Standing shoulder extension yellow band 2 x 10 with ab set At counter: marching with ab set 3 x 10 Seated:  glute/ piriformis stretch x 15s x 2 each- 2 sets; hamstring stretch x 15s x 2 each LE Therapeutic Activity: 5 x STS  STS from standard chair with 4 sec eccentric lowering to chair x 7  Neuro re-ed Tandem gait forward/ backward 5 ft at counter,  intermittent UE support SLS with 3 way toe touch 2 x 5  [facility] Adult PT Treatment:                                                DATE: 03/15/23 NuStep, L5,UE/LE x 6 min for warm up - therapist present to monitor STS 1 x 10 Ab set with PPT 10 x 5 second holds March with ab set 3 x 10 Standing row RTB 1 x 10 with ab set Standing shoulder extension RTB 1 x 10 with ab set   [facility] Adult PT Treatment:                                                DATE: 03/03/23  Therapeutic Exercise: NuStep, L4,UE/LE x 6 min for warm up - therapist present to monitor At counter: Hip ext to toe touch 2 x10;  Hip abdct 2 x 10;  heel/toe raises x 10; marching x 10 Seated:  glute/ piriformis stretch x 15s x 2 each- 2 sets; hamstring stretch x 15s x 2 each LE  Therapeutic Activity: STS from standard chair with 4 sec eccentric lowering to chair x 8, cues for head position and to widen feet Squat holding elevated table with cues for hip hinge x 5    [facility] Adult PT Treatment:  DATE:02/24/23 Therapeutic Ex:  NuStep, L4,UE/LE x 5.5 min for warm up -  therapist present to monitor Standing trunk ext to neutral  UE on counter:  hip ext to touch toe x 10; hip abdct x 10; heel raises x 10 Seated:  glute/ piriformis stretch x 15s x 2 each; hamstring stretch x 15s x 2 each LE  Therapeutic Activity:  Sit to/from stand from chair with cues on hip hinge and controlled descent x  10  Self care: pt instructed in self massage to gluteals and lumbar musculature; returned demo with cues    [facility] Adult PT Treatment:                                                DATE:02/21/23  NuStep, L3, LE only x 5 min for warm up - therapist present to monitor Standing trunk ext to neutral  UE on counter:  hip ext to touch toe 2 x 5 Reviewed body mechanics for sit to/from supine via log roll; pt returned demo Bridge  2 x 10  - 2nd set with 3 sec hold in ext Hooklying: glute /piriformis stretch x20s x 2 each LE;  hamstring stretch with strap x 20s x 2 each LE;  90/90 nerve glides x 10 each LE  PATIENT EDUCATION:  Education details: reviewed/updated HEP Person educated: Patient Education method: Programmer, multimedia, Facilities manager, and Handouts Education comprehension: verbalized understanding, returned demonstration, verbal cues required, and tactile cues required  HOME EXERCISE PROGRAM: Access Code: [email or web address] URL: [email or web address]  ASSESSMENT:  CLINICAL IMPRESSION: Patient has met 1/2 short term goals and 1/5 long term goals with ability to complete HEP and improvement in functional mobility. Remaining goals not met due to continued deficits in symptoms, strength, ROM, activity tolerance, gait, balance. Patient has made good progress toward remaining goals. Patient with no change in foot symptoms since starting therapy although she thought it was improving. She thought symptoms were improving until about 2 weeks ago, she also started using small stepper/bike around this time which may be contributing to poor posture and increase in symptoms. She will d/c  use of bike and trial completion of HEP. Discussed use of compression garments which she has not been using much due to having thigh high aggravating knee. Educated on trial of knee high. Will f/u up on changes next week and go from there. Patient will continue to benefit from skilled physical therapy in order to improve function and reduce impairment.     From initial evaluation:  Patient a 83 y.o. y.o. female who was seen today for physical therapy evaluation and treatment for lumbosacral radiculopathy. Patient presents with pain limited deficits in lumbar spine and LE strength, ROM, endurance, activity tolerance, and functional mobility with ADL. Patient is having to modify and restrict ADL as indicated by outcome measure score as well as subjective information and objective measures which is affecting overall participation. Patient will benefit from skilled physical therapy in order to improve function and reduce impairment.  OBJECTIVE IMPAIRMENTS: decreased activity tolerance, decreased endurance, decreased mobility, decreased ROM, decreased strength, increased muscle spasms, impaired flexibility, impaired sensation, improper body mechanics, postural dysfunction, and pain.   ACTIVITY LIMITATIONS: carrying, lifting, bending, standing, squatting, stairs, transfers, bed mobility, locomotion level, and caring for others  PARTICIPATION LIMITATIONS: meal prep, cleaning, laundry, shopping, community activity, yard work, and sleeping  PERSONAL FACTORS: Age, Time  since onset of injury/illness/exacerbation, and 1-2 comorbidities: HTN, HLD  are also affecting patient's functional outcome.   REHAB POTENTIAL: Good  CLINICAL DECISION MAKING: Stable/uncomplicated  EVALUATION COMPLEXITY: Low   GOALS: Goals reviewed with patient? Yes  SHORT TERM GOALS: Target date: 03/14/2023    Patient will be independent with HEP in order to improve functional outcomes. Baseline:  Goal status: MET  2.  Patient  will report at least 25% improvement in symptoms for improved quality of life. Baseline: 15-20% improvement - 03/17/23 Goal status:In progress     LONG TERM GOALS: Target date: 04/11/2023    Patient will report at least 75% improvement in symptoms for improved quality of life. Baseline:  Goal status: INITIAL  2.  Patient will improve modified Oswestry to less than 2% in order to indicate improved tolerance to activity. Baseline:  Goal status: INITIAL  3.  Patient will demonstrate grade of 5/5 MMT grade in all tested musculature as evidence of improved strength to assist with stair ambulation and gait.   Baseline:  Goal status: INITIAL  4.  Patient will be able to complete 5x STS in under 11.4 seconds in order to demonstrate improved functional strength. Baseline:  Goal status: MET  5.  Patient will report improved sleep for improved quality of life.  Baseline:  Goal status: INITIAL    PLAN:  PT FREQUENCY: 1-2x/week  PT DURATION: 8 weeks  PLANNED INTERVENTIONS: 97164- PT Re-evaluation, 97110-Therapeutic exercises, 97530- Therapeutic activity, 97112- Neuromuscular re-education, 97535- Self Care, 96045- Manual therapy, [phone]- Gait training, [phone]- Orthotic Fit/training, [phone]- Canalith repositioning, [id number]- Aquatic Therapy, [phone]- Splinting, Patient/Family education, Balance training, Stair training, Taping, Dry Needling, Joint mobilization, Joint manipulation, Spinal manipulation, Spinal mobilization, Scar mobilization, and DME instructions.  PLAN FOR NEXT SESSION: f/u with HEP, core and glute strength   Wyman Songster, PT 03/31/2023, 12:51 PM  [facility] [address] [place], [place], [address] Phone: [phone]   Fax:  [phone]

## 2023-04-03 ENCOUNTER — Telehealth: Payer: Self-pay | Admitting: Neurology

## 2023-04-03 DIAGNOSIS — M5417 Radiculopathy, lumbosacral region: Secondary | ICD-10-CM

## 2023-04-03 DIAGNOSIS — R292 Abnormal reflex: Secondary | ICD-10-CM

## 2023-04-03 NOTE — Telephone Encounter (Signed)
 Called patient she stated that she has done a total of 10 sessions of PT since 03/03/23 and has been feeling worse. She os having an increase in numbness and experiencing more back issues. Patient states she has 4 more sessions scheduled and wants to know if she should continue this? Patient has not started taking her Gabapentin due to the side effects she has read. She takes Tylenol as needed.    She also wanted to know about the MRI and if she needs to have that done now?   Patient also wanted to let Dr. Allena Katz know that her PCP did an U/S on her left leg to check her flow and all was fine with that. Patient also has an apt scheduled for her cyst behind her knee this Thursday at 3:15 pm.  Patient aware I will give her a call back with advice.

## 2023-04-03 NOTE — Telephone Encounter (Signed)
 I recommend that she stop PT.  Yes, we discussed getting MRI lumbar spine at her last visit, but due to claustrophobia, she wanted to hold on testing.  If she is agreeable now, we can send her to [facility] for open MRI lumbar spine wo contrast.

## 2023-04-03 NOTE — Telephone Encounter (Signed)
 Called and informed patient of Dr. Allena Katz recommendations  per below:   I recommend that she stop PT.  Yes, we discussed getting MRI lumbar spine at her last visit, but due to claustrophobia, she wanted to hold on testing.  If she is agreeable now, we can send her to [facility] for open MRI lumbar spine wo contrast.    Patient verbalized understanding and would like her MRI Lumbar spine sent to [facility]. Patient is aware once prior authorization is completed I will call and get her scheduled and let her know time and location. Patient thanked [place] for the call and had no further questions or concerns.

## 2023-04-03 NOTE — Telephone Encounter (Signed)
 Caller stated since she stated PT she feels worse. She has numbness and pain and would like feedback from nurse to decided if she should stop going to PT

## 2023-04-05 ENCOUNTER — Encounter (HOSPITAL_BASED_OUTPATIENT_CLINIC_OR_DEPARTMENT_OTHER): Payer: Medicare HMO | Admitting: Physical Therapy

## 2023-04-05 DIAGNOSIS — E039 Hypothyroidism, unspecified: Secondary | ICD-10-CM | POA: Diagnosis not present

## 2023-04-06 ENCOUNTER — Ambulatory Visit (HOSPITAL_COMMUNITY)

## 2023-04-06 DIAGNOSIS — M170 Bilateral primary osteoarthritis of knee: Secondary | ICD-10-CM | POA: Diagnosis not present

## 2023-04-07 ENCOUNTER — Encounter (HOSPITAL_BASED_OUTPATIENT_CLINIC_OR_DEPARTMENT_OTHER): Payer: Medicare HMO | Admitting: Physical Therapy

## 2023-04-11 DIAGNOSIS — M48061 Spinal stenosis, lumbar region without neurogenic claudication: Secondary | ICD-10-CM | POA: Diagnosis not present

## 2023-04-12 ENCOUNTER — Encounter (HOSPITAL_BASED_OUTPATIENT_CLINIC_OR_DEPARTMENT_OTHER): Payer: Medicare HMO | Admitting: Physical Therapy

## 2023-04-13 DIAGNOSIS — M7121 Synovial cyst of popliteal space [Baker], right knee: Secondary | ICD-10-CM | POA: Diagnosis not present

## 2023-04-13 DIAGNOSIS — M7122 Synovial cyst of popliteal space [Baker], left knee: Secondary | ICD-10-CM | POA: Diagnosis not present

## 2023-04-14 ENCOUNTER — Encounter (HOSPITAL_BASED_OUTPATIENT_CLINIC_OR_DEPARTMENT_OTHER): Payer: Medicare HMO | Admitting: Physical Therapy

## 2023-04-20 DIAGNOSIS — H26491 Other secondary cataract, right eye: Secondary | ICD-10-CM | POA: Diagnosis not present

## 2023-04-25 DIAGNOSIS — M25562 Pain in left knee: Secondary | ICD-10-CM | POA: Diagnosis not present

## 2023-04-26 ENCOUNTER — Other Ambulatory Visit

## 2023-04-26 ENCOUNTER — Ambulatory Visit (INDEPENDENT_AMBULATORY_CARE_PROVIDER_SITE_OTHER): Payer: Medicare HMO | Admitting: Neurology

## 2023-04-26 ENCOUNTER — Encounter: Payer: Self-pay | Admitting: Neurology

## 2023-04-26 VITALS — BP 142/74 | HR 77 | Ht 63.0 in | Wt 145.0 lb

## 2023-04-26 DIAGNOSIS — R202 Paresthesia of skin: Secondary | ICD-10-CM

## 2023-04-26 DIAGNOSIS — G2581 Restless legs syndrome: Secondary | ICD-10-CM | POA: Diagnosis not present

## 2023-04-26 NOTE — Progress Notes (Signed)
 Follow-up Visit   Date: 04/26/2023    Melissa Tapia MRN: [id number] DOB: [birth year]    Melissa Tapia is a 82 y.o. left-handed Caucasian female with  interstitial lung disease, hypertension, and hyperlipidemia returning to the clinic for follow-up of bilateral feet pain.  The patient was accompanied to the clinic by self.  IMPRESSION/PLAN: Possible restless leg syndrome manifesting with nighttime feet discomfort.  Medication was declined as symptoms are not bothersome enough.   - NCS/EMG of the legs to evaluate for neuropathy  - Check ferritin Lumbosacral degenerative changes with lumbar canal stenosis at moderate L2-3 and L4-5, which would not explain her feet paresthesias.  She tried PT but due to worsening back pain, it was stopped. She is asymptomatic.   Return to clinic in 4 months  --------------------------------------------- History of present illness: Bilateral feet paresthesias and pain with associated lumbar pain.  Her exam shows intact distal sensation with increased patella jerks and reduced S1 reflexes. These findings may suggest that she has lumbosacral degenerative changes and S1 radiculopathy may explain her feet paresthesias. I recommend PT for low back strengthening and trial of gabapentin 100mg  at bedtime x 1 week, then increase to 200mg  at bedtime.  I discussed NCS/EMG to further evaluate her symptoms, which was mutually decided to consider only if her symptoms do not improve with PT. With her intact distal sensation and intermittent symptoms, neuropathy is less likely.  She has claustrophobia and does not want to have imaging at this time.    Return to clinic in 3-4 months, or sooner as needed  UPDATE 04/26/2023:  She wakes up about twice at night with her ankles and soles of the feet with discomfort.  She often wakes up and walks around to get relief.  She is able to go back to sleep without problems.  She no longer has prickly sensation of the feet.  In the  morning, she has some numbness of the toes.  No problems with weakness or balance.   She completed 7 session of PT which worsened low back pain, so stopped it.  She no longer has low back pain. She had MRI lumbar spine which shows mild spinal stenosis at L2-3 and moderate biforaminal stenosis as well as moderate spinal stenosis L3-4 and L4-5.    Medications:  Current Outpatient Medications on File Prior to Visit  Medication Sig Dispense Refill   atorvastatin (LIPITOR) 10 MG tablet Take 10 mg by mouth at bedtime.     Calcium-Vitamin D (CALTRATE 600 PLUS-VIT D PO) Caltrate 600 plus D     Cholecalciferol 25 MCG (1000 UT) capsule Take by mouth.     Cyanocobalamin (B-12 COMPLIANCE INJECTION IJ) Inject as directed. Inject every 3 months     cyanocobalamin (VITAMIN B12) 1000 MCG tablet Take 1 tablet by mouth daily.     denosumab (PROLIA) 60 MG/ML SOSY injection first was 8/20 Subcutaneous     docusate sodium (COLACE) 50 MG capsule Take 50 mg by mouth 2 (two) times daily.     gabapentin (NEURONTIN) 100 MG capsule Take 1 tablet at bedtime x 1 week, then increase to 2 tablets at bedtime. (Patient taking differently: Take 1 tablet at bedtime x 1 week, then increase to 2 tablets at bedtime.  Patient has not started yet) 60 capsule 3   Multiple Vitamins-Minerals (CENTRUM SILVER 50+WOMEN) TABS Centrum     Omega-3 Fatty Acids (FISH OIL) 1200 MG CAPS      telmisartan (MICARDIS) 20 MG tablet  1/2 TABLET ORALLY ONCE A DAY IN THE EVENING 90 DAYS Orally Once a day for 90 days (Patient taking differently: One tablet daily)     No current facility-administered medications on file prior to visit.    Allergies:  Allergies  Allergen Reactions   Aspirin Nausea Only   Sulfa Antibiotics Rash    In 1963    Vital Signs:  BP (!) 142/74   Pulse 77   Ht 5\' 3"  (1.6 m)   Wt 145 lb (65.8 kg)   SpO2 97%   BMI 25.69 kg/m   Neurological Exam: MENTAL STATUS including orientation to time, place, person, recent and  remote memory, attention span and concentration, language, and fund of knowledge is normal.  Speech is not dysarthric.  CRANIAL NERVES:  Pupils equal round and reactive to light.  Normal conjugate, extra-ocular eye movements in all directions of gaze.  No ptosis.  Face is symmetric. Palate elevates symmetrically.  Tongue is midline.  MOTOR:  Motor strength is 5/5 in all extremities.  No atrophy, fasciculations or abnormal movements.  No pronator drift.  Tone is normal.    MSRs:                                           Right        Left brachioradialis 2+  2+  biceps 2+  2+  triceps 2+  2+  patellar 3+  3+  ankle jerk 1+  1+   SENSORY:  Intact to vibration throughout.  COORDINATION/GAIT:  Normal finger-to- nose-finger.  Intact rapid alternating movements bilaterally.  Gait is mildly stopped, narrow based and stable.   Data: MRI lumbar spine 04/11/2023: Mild spinal stenosis at L2-3 and moderate biforaminal stenosis  Moderate spinal stenosis L3-4 and L4-5. Old compression fracture L2.   Thank you for allowing me to participate in patient's care.  If I can answer any additional questions, I would be pleased to do so.    Sincerely,    Keiton Cosma K. Allena Katz, DO

## 2023-04-26 NOTE — Patient Instructions (Signed)
 Check ferritin level  Nerve testing of both legs  ELECTROMYOGRAM AND NERVE CONDUCTION STUDIES (EMG/NCS) INSTRUCTIONS  How to Prepare The neurologist conducting the EMG will need to know if you have certain medical conditions. Tell the neurologist and other EMG lab personnel if you: Have a pacemaker or any other electrical medical device Take blood-thinning medications Have hemophilia, a blood-clotting disorder that causes prolonged bleeding Bathing Take a shower or bath shortly before your exam in order to remove oils from your skin. Don't apply lotions or creams before the exam.  What to Expect You'll likely be asked to change into a hospital gown for the procedure and lie down on an examination table. The following explanations can help you understand what will happen during the exam.  Electrodes. The neurologist or a technician places surface electrodes at various locations on your skin depending on where you're experiencing symptoms. Or the neurologist may insert needle electrodes at different sites depending on your symptoms.  Sensations. The electrodes will at times transmit a tiny electrical current that you may feel as a twinge or spasm. The needle electrode may cause discomfort or pain that usually ends shortly after the needle is removed. If you are concerned about discomfort or pain, you may want to talk to the neurologist about taking a short break during the exam.  Instructions. During the needle EMG, the neurologist will assess whether there is any spontaneous electrical activity when the muscle is at rest - activity that isn't present in healthy muscle tissue - and the degree of activity when you slightly contract the muscle.  He or she will give you instructions on resting and contracting a muscle at appropriate times. Depending on what muscles and nerves the neurologist is examining, he or she may ask you to change positions during the exam.  After your EMG You may experience  some temporary, minor bruising where the needle electrode was inserted into your muscle. This bruising should fade within several days. If it persists, contact your primary care doctor.

## 2023-04-27 ENCOUNTER — Encounter: Payer: Self-pay | Admitting: Neurology

## 2023-04-27 LAB — FERRITIN: Ferritin: 170 ng/mL (ref 16–288)

## 2023-05-17 DIAGNOSIS — M170 Bilateral primary osteoarthritis of knee: Secondary | ICD-10-CM | POA: Diagnosis not present

## 2023-05-19 ENCOUNTER — Ambulatory Visit (HOSPITAL_COMMUNITY)
Admission: RE | Admit: 2023-05-19 | Discharge: 2023-05-19 | Disposition: A | Discharge status: Home / Self Care | Source: Ambulatory Visit | Attending: Internal Medicine | Admitting: Internal Medicine

## 2023-05-19 DIAGNOSIS — J849 Interstitial pulmonary disease, unspecified: Secondary | ICD-10-CM | POA: Diagnosis not present

## 2023-05-25 ENCOUNTER — Ambulatory Visit: Admitting: Neurology

## 2023-05-25 DIAGNOSIS — R202 Paresthesia of skin: Secondary | ICD-10-CM | POA: Diagnosis not present

## 2023-05-25 DIAGNOSIS — G2581 Restless legs syndrome: Secondary | ICD-10-CM

## 2023-05-25 NOTE — Procedures (Signed)
 [facility]  [address] [place], [address]  [place], [place] [address] Tel: [phone] Fax: [phone] Test Date:  05/25/2023  Patient: Melissa Tapia DOB: 1940/06/30 Physician: Reyna Cava, DO  Sex: Female Height: 5\' 3"  Ref Phys: Reyna Cava, DO  ID#: [id number]   Technician:    History: This is a 83 year old female referred for evaluation of bilateral feet paresthesias.  NCV & EMG Findings: Electrodiagnostic testing of the right lower extremity and additional studies of the left shows: Bilateral sural and superficial peroneal sensory responses are within normal limits. Bilateral peroneal and tibial motor responses are within normal limits. Bilateral tibial H reflex studies are within normal limits. There is no evidence of active or chronic motor axonal changes affecting any of the tested muscles.  Motor unit configuration and recruitment pattern is within normal limits.  Impression: This is a normal study of the lower extremities.  In particular, there is no evidence of a large fiber sensorimotor polyneuropathy or lumbosacral radiculopathy.   ___________________________ Reyna Cava, DO    Nerve Conduction Studies   Stim Site NR Peak (ms) Norm Peak (ms) O-P Amp (V) Norm O-P Amp  Left Sup Peroneal Anti Sensory (Ant Lat Mall)  32 C  12 cm    2.4 <4.6 9.4 >3  Right Sup Peroneal Anti Sensory (Ant Lat Mall)  32 C  12 cm    2.1 <4.6 9.3 >3  Left Sural Anti Sensory (Lat Mall)  32 C  Calf    2.5 <4.6 9.3 >3  Right Sural Anti Sensory (Lat Mall)  32 C  Calf    2.4 <4.6 10.1 >3     Stim Site NR Onset (ms) Norm Onset (ms) O-P Amp (mV) Norm O-P Amp Site1 Site2 Delta-0 (ms) Dist (cm) Vel (m/s) Norm Vel (m/s)  Left Peroneal Motor (Ext Dig Brev)  32 C  Ankle    3.8 <6.0 2.9 >2.5 B Fib Ankle 6.5 32.0 49 >40  B Fib    10.3  2.8  Poplt B Fib 1.7 8.0 47 >40  Poplt    12.0  2.7         Right Peroneal Motor (Ext Dig Brev)  32 C  Ankle    2.7 <6.0 2.9 >2.5 B Fib Ankle  6.8 36.0 53 >40  B Fib    9.5  2.8  Poplt B Fib 1.7 8.0 47 >40  Poplt    11.2  2.8         Left Tibial Motor (Abd Hall Brev)  32 C  Ankle    3.2 <6.0 7.0 >4 Knee Ankle 7.2 37.0 51 >40  Knee    10.4  6.5         Right Tibial Motor (Abd Hall Brev)  32 C  Ankle    3.4 <6.0 7.7 >4 Knee Ankle 7.2 41.0 57 >40  Knee    10.6  5.0          Electromyography   Side Muscle Ins.Act Fibs Fasc Recrt Amp Dur Poly Activation Comment  Right AntTibialis Nml Nml Nml Nml Nml Nml Nml Nml N/A  Right Gastroc Nml Nml Nml Nml Nml Nml Nml Nml N/A  Right Flex Dig Long Nml Nml Nml Nml Nml Nml Nml Nml N/A  Right RectFemoris Nml Nml Nml Nml Nml Nml Nml Nml N/A  Right GluteusMed Nml Nml Nml Nml Nml Nml Nml Nml N/A  Left AntTibialis Nml Nml Nml Nml Nml Nml Nml Nml N/A  Left Gastroc Nml  Nml Nml Nml Nml Nml Nml Nml N/A  Left Flex Dig Long Nml Nml Nml Nml Nml Nml Nml Nml N/A  Left RectFemoris Nml Nml Nml Nml Nml Nml Nml Nml N/A  Left GluteusMed Nml Nml Nml Nml Nml Nml Nml Nml N/A      Waveforms:

## 2023-05-30 ENCOUNTER — Ambulatory Visit: Payer: Self-pay

## 2023-06-19 ENCOUNTER — Ambulatory Visit (INDEPENDENT_AMBULATORY_CARE_PROVIDER_SITE_OTHER): Admitting: Internal Medicine

## 2023-06-19 ENCOUNTER — Encounter: Payer: Self-pay | Admitting: Internal Medicine

## 2023-06-19 VITALS — BP 126/68 | HR 74 | Ht 63.0 in | Wt 150.0 lb

## 2023-06-19 DIAGNOSIS — I2510 Atherosclerotic heart disease of native coronary artery without angina pectoris: Secondary | ICD-10-CM

## 2023-06-19 DIAGNOSIS — Z8709 Personal history of other diseases of the respiratory system: Secondary | ICD-10-CM

## 2023-06-19 DIAGNOSIS — J849 Interstitial pulmonary disease, unspecified: Secondary | ICD-10-CM | POA: Diagnosis not present

## 2023-06-19 DIAGNOSIS — Z01811 Encounter for preprocedural respiratory examination: Secondary | ICD-10-CM

## 2023-06-19 DIAGNOSIS — Z87891 Personal history of nicotine dependence: Secondary | ICD-10-CM

## 2023-06-19 NOTE — Progress Notes (Signed)
 OV 10/21/2021 to Dr. Bertrum Brodie at the ILD center by Dr. Aldo Hun  Subjective:  Patient ID: Melissa Tapia, female , DOB: [birth year] , age 83 y.o. , MRN: [id number] , ADDRESS: [address] [place] [facility] [address] PCP Aldo Hun, MD Patient Care Team: Aldo Hun, MD as PCP - General (Internal Medicine)  This Provider for this visit: Treatment Team:  Attending Provider: Maire Scot, MD    10/21/2021 -   Chief Complaint  Patient presents with   Consult    Pt recently had a CT performed which is the reason for today's visit.  Pt states that she does have complaints of SOB that can happen at anytime.     HPI Melissa Tapia 83 y.o. -is a retired Runner, broadcasting/film/video.  She works at [employer] in the schools and [employer].  School was built in [place] but a subsequent building was built in 1980s.  She worked in the old building and then retired at some point.  Her current symptoms are somewhat atypical with the evaluation for ILD.  She says all of a sudden in May 2023 she had phlebitis in the lower extremity and also a Baker's cyst removed but then by the end of July 2023 she had nausea and was treated with Nexium  in the emergency room.  Then by September 5 and 6, 2023 she started feeling significantly different and started having shortness of breath.  By September 11 she was started on Advair September 20 this was changed to [facility] and this is actually helping her symptoms.  During all this she ended up with a high-resolution CT chest that showed ILD and therefore she has been referred here.  According to the radiologist the findings worse compared to 2015.  In my personal visualization she has had the findings at least since 2021 but possibly not in 2015.  It appears stable to me as of September 2023.  She actually does not have any chronic shortness of breath or cough.  She does report new onset paroxysmal nocturnal dyspnea more recently not otherwise specified.  She wakes up with  dyspnea also feels paresthesia in her fingers.  This has not been evaluated she is wondering if this because of anxiety.  She says recently her sister came and slept with then she did not have it.  Her husband died 18 months ago and she is living alone and this makes her a little bit nervous.  [facility] Comprehensive ILD Questionnaire  Symptoms:  x Past Medical History :  -She might have asthma per history but otherwise a lot of past medical history is negative   ROS:  She has arthralgia which she thinks is due to age - She has new onset paroxysmal nocturnal dyspnea. - She has dry mouth not otherwise specified. - After her husband died 18 months ago she is lost 8 pounds. - She has had nausea for the last few weeks this is associated with the paroxysmal nocturnal dyspnea.  FAMILY HISTORY of LUNG DISEASE:  -She thinks her dad had pulmonary fibrosis*  PERSONAL EXPOSURE HISTORY:  -She smoked some in college in 1962 but that about it.  Detailed organic and inorganic antigen exposure history in the house is negative.  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  Old building built in 1928.  A new building was built in the 1980s.  She was exposed to old buildings but does not remember any dampness or musty smell or mold..  Detailed organic and inorganic antigen exposure history is negative  PULMONARY TOXICITY HISTORY (27 items):  -.  This is negative.  INVESTIGATIONS: x   CT Chest data - HRCT 09/30/21   Narrative & Impression  CLINICAL DATA:  Pulmonary fibrosis   EXAM: CT CHEST WITHOUT CONTRAST   TECHNIQUE: Multidetector CT imaging of the chest was performed following the standard protocol without intravenous contrast. High  resolution imaging of the lungs, as well as inspiratory and expiratory imaging, was performed.   RADIATION DOSE REDUCTION: This exam was performed according to the departmental dose-optimization program which includes automated exposure control, adjustment of the mA and/or kV according to patient size and/or use of iterative reconstruction technique.   COMPARISON:  CT chest pulmonary angiogram, 09/24/2021, CT chest coronary calcium scoring, 09/11/2019, CT abdomen pelvis, 04/24/2013   FINDINGS: Cardiovascular: Aortic atherosclerosis. Normal heart size. Scattered left coronary artery calcifications. No pericardial effusion.   Mediastinum/Nodes: No enlarged mediastinal, hilar, or axillary lymph nodes. Small hiatal hernia. Thyroid  gland, trachea, and esophagus demonstrate no significant findings.   Lungs/Pleura: Mild pulmonary fibrosis in a pattern with apical to basal gradient, featuring irregular peripheral interstitial opacity and ground-glass, as well as traction bronchiectasis at the lung bases, however without clear evidence of subpleural bronchiolectasis or honeycombing. Fibrotic findings appear very slightly worsened over time when compared to the lung bases as included on examination dated 04/24/2013. No significant air trapping on expiratory phase imaging. No pleural effusion or pneumothorax.   Upper Abdomen: No acute abnormality.   Musculoskeletal: No chest wall abnormality. No acute osseous findings.   IMPRESSION: 1. Mild pulmonary fibrosis in a pattern with apical to basal gradient, featuring irregular peripheral interstitial opacity and ground-glass, as well as traction bronchiectasis at the lung bases, however without clear evidence of subpleural bronchiolectasis or honeycombing. Fibrotic findings appear very slightly worsened over time when compared to the lung bases as included on examination dated 04/24/2013. Findings are indeterminate for UIP per  consensus guidelines, differential considerations including both UIP and NSIP: Diagnosis of Idiopathic Pulmonary Fibrosis: An Official ATS/ERS/JRS/ALAT Clinical Practice Guideline. Am Annie Barton Crit Care Med Vol 198, Iss 5, [phone], Sep 17 2016. Consider pulmonary referral and interstitial lung disease protocol CT follow-up in 1 year if indicated by clinical suspicion for fibrotic interstitial lung disease. 2. Coronary artery disease. 3. Cholelithiasis.   Aortic Atherosclerosis (ICD10-I70.0).     Electronically Signed   By: Fredricka Jenny M.D.   On: 10/01/2021 10:02    No results found.    OV 01/24/2022  Subjective:  Patient ID: Melissa Tapia, female , DOB: Apr 28, 1940 , age 8 y.o. , MRN: [id number] , ADDRESS: [address] [place] [facility] [address] PCP Aldo Hun, MD Patient Care Team: Aldo Hun, MD as PCP - General (Internal Medicine)  This Provider for this visit: Treatment Team:  Attending Provider: Maire Scot, MD    01/24/2022 -   Chief Complaint  Patient presents with   Follow-up    PFT, ILD, feeling better from last visit.     HPI TEHILA SOKOLOW 83 y.o. -returns for follow-up.  She continues to be asymptomatic from a respiratory standpoint.  She used to have some paroxysmal nocturnal dyspnea but we got an echocardiogram that showed grade 1 diastolic  dysfunction but otherwise normal.  BNP was normal.  She does not have any dyspnea on exertion there is no cough.  She believes the paroxysmal nocturnal dyspnea was because of anxiety.  She still gets up at night but not because of shortness of breath but more because of dry mouth.  She is taking fish oil.  She is taking Breo because of clinical asthma prescriber primary care physician she believes this is helping her.  She full pulmonary function test and this is essentially normal with a normal FVC and DLCO.  She did have overnight desaturation test and she desaturated for 7 minutes which is very mild.  She did  not want to do oxygen  for this.  She has other complaints - She recently has been diagnosed by primary care physician with thrush.  She has tried different treatments.  On my exam today there is no thrush.  She is continuing with those.  She understands Jodell Munda is a risk factor for this but she believes Jodell Munda is helping her.  We talked about doing Biotene and also flaxseed and she is going to do this   - She has not had RSV vaccine.  She discussed with primary care physician was recommended.  I seconded the recommendation very strongly.    Latest Reference Range & Units 10/21/21 15:37  CK Total 29 - 143 U/L 65  CK, MB 0 - 5.0 ng/mL 0.9  Pro B Natriuretic peptide (BNP) 0.0 - 100.0 pg/mL 21.0  Aldolase < OR = 8.1 U/L 4.8  Sed Rate 0 - 30 mm/hr 1  Anti Nuclear Antibody (ANA) NEGATIVE  NEGATIVE  Angiotensin-Converting Enzyme 9 - 67 U/L 20  Cyclic Citrullin Peptide Ab UNITS <16  ds DNA Ab IU/mL <1  RA Latex Turbid. <14 IU/mL <14  SSA (Ro) (ENA) Antibody, IgG <1.0 NEG AI <1.0 NEG  SSB (La) (ENA) Antibody, IgG <1.0 NEG AI <1.0 NEG  Scleroderma (Scl-70) (ENA) Antibody, IgG <1.0 NEG AI <1.0 NEG    ECHO OCt 2023   IMPRESSIONS     1. Left ventricular ejection fraction, by estimation, is 60 to 65%. Left  ventricular ejection fraction by 3D volume is 65 %. The left ventricle has  normal function. The left ventricle has no regional wall motion  abnormalities. Left ventricular diastolic   parameters are consistent with Grade I diastolic dysfunction (impaired  relaxation).   2. Right ventricular systolic function is normal. The right ventricular  size is normal. There is normal pulmonary artery systolic pressure.   3. The mitral valve is normal in structure. No evidence of mitral valve  regurgitation. No evidence of mitral stenosis.   4. The aortic valve is tricuspid. Aortic valve regurgitation is not  visualized. No aortic stenosis is present.   5. The inferior vena cava is normal in size  with greater than 50%  respiratory variability, suggesting right atrial pressure of 3 mmHg.     OV 06/23/2022  Subjective:  Patient ID: Melissa Tapia, female , DOB: June 27, 1940 , age 69 y.o. , MRN: [id number] , ADDRESS: [address] [place] [facility] [address] PCP Aldo Hun, MD Patient Care Team: Aldo Hun, MD as PCP - General (Internal Medicine)  This Provider for this visit: Treatment Team:  Attending Provider: Maire Scot, MD    06/23/2022 -   Chief Complaint  Patient presents with   Follow-up    Review PFT today.  Waking during night with left food discomfort.  Breathing - no issues.  Weight gain,  hair loss and finger nail abnormality      HPI Melissa Tapia 83 y.o. -returns for follow-up.  She has mild subclinical ILD for which she is on supportive care and expectant follow-up.  She continues to be on Breo since October 2023 following shortness of breath.  She does not have any shortness of breath or chest pain or wheezing or paroxysmal nocturnal dyspnea orthopnea or cough.  Infection attributes the symptom relief due to Breo.  The diagnose of asthma is clinical.  Is made with the primary care doctor.  She is wondering if she should be on Breo.  We had back-and-forth discussion about this.  I did indicate that she could stop the Breo and see if her symptoms returned.  If it returns we would give her prednisone to quell the symptoms.  Told her to watch for shortness of breath or cough or wheezing or chest tightness or bronchitis.  We took a shared decision making to stop the Breo and monitor her.  In terms of ILD her pulmonary function test is stable.  She continues to be asymptomatic.  Her exercise hypoxemia test today was also normal.  Did indicate to her the best plan of action is for us  to continue to follow closely and intervene with antifibrotic's if there is progression.  Overall I predict good prognosis for her given her normal PFTs and asymptomatic  state.          OV 12/20/2022  Subjective:  Patient ID: Melissa Tapia, female , DOB: March 01, 1940 , age 79 y.o. , MRN: [id number] , ADDRESS: [address] [place] [facility] [address] PCP Aldo Hun, MD Patient Care Team: Aldo Hun, MD as PCP - General (Internal Medicine)  This Provider for this visit: Treatment Team:  Attending Provider: Maire Scot, MD      12/20/2022 -   Chief Complaint  Patient presents with   Follow-up    6 month follow up  finger nails are flatted out, discuss oxygen  are going down when sleeping, notice tingle in feet. Discuss hydration levels       HPI HENRYETTA CORRIVEAU 83 y.o. -returns for 64-month follow-up.  She is off of Breo and doing well.  Essentially asymptomatic.  She had pulmonary function test and this is still normal.  Her concerns today is that for the last 3 weeks she has had tingling in her feet particularly when she goes to bed.  She has osteoarthritis bone-on-bone on her knee.  And she is wondering if this could be related to ILD.  However rest of pulmonary symptoms are normal and also pulmonary function test is normal.  Therefore I told her this was extremely low odds tingling is got relationship with potential nocturnal hypoxemia.  Therefore she said she will talk to primary care doctor about it.  She is also reporting nasal flattening for the last few months.  She showed me that her nails will become more concave particularly one of her thumb.  Did indicate to her and showed her pictures of clubbing and is the opposite.  Therefore she said she will talk to her primary care doctor.  Clubbing is associated with lung disease and fibrosis but she does not have this.  She is also trying to stay hydrated and she showed some finger wrinkles.  But there is no Raynaud's there is no other skin thickening there is no scleroderma.  And she said she will talk to the primary care doctor.  In addition she had  questions about the COVID mRNA vaccine.  I  did indicate to her that the side effect profile well overall safe mimics the disease and is definitely more than a flu shot or RSV vaccine.  She has had COVID mRNA vaccine before without any problems therefore we took a shared decision making that she will continue to have COVID shot this fall.       OV 06/19/2023  Subjective:  Patient ID: Melissa Tapia, female , DOB: 1940-03-25 , age 52 y.o. , MRN: [id number] , ADDRESS: [address] [place] [address] PCP Aldo Hun, MD Patient Care Team: Aldo Hun, MD as PCP - General (Internal Medicine) Daryel Ensign, DO as Consulting Physician (Neurology)  This Provider for this visit: Treatment Team:  Attending Provider: Maire Scot, MD    06/19/2023 -   Chief Complaint  Patient presents with   Interstitial Lung Disease   Early ILD/subclinical ILD  -indeterminate for UIP September 2023 CT scan; normal pulmonary function test January 2024 June 2024.  -   Clinical asthma being treated with Breo by primary care physician.->  Off Breo since summer 2024 and on expectation follow-up.  HPI SHAKYIA BOSSO 83 y.o. -returns for follow-up.  At this point in time she is doing stable is asymptomatic. Interim Health status: No new complaints No new medical problems. No new surgeries. No ER visits. No Urgent care visits. No changes to medications.  Only issue is that her sciatic pain and her left knee are bothering her which.  This is a chronic issue she is contemplating surgery but no decision has been made.  She did have high-resolution CT chest that I personally visualized.  I disagree with the probable UIP description and I agree it is indeterminate.  It is stable since 2023.  She is essentially asymptomatic at this point.  This reports of coronary artery calcification and we discussed this.  There is also report of aortic diameter being 3.8 cm.  Referred to cardiology.  She has never had a stress test but there is no chest  pain.     SYMPTOM SCALE - ILD 10/21/2021 06/23/2022 breo 12/20/2022 Off breo 06/19/2023   Current weight    ra  O2 use 0 0    Shortness of Breath 0 -> 5 scale with 5 being worst (score 6 If unable to do)     At rest 00 0 0 0  Simple tasks - showers, clothes change, eating, shaving 00 0 0 0  Household (dishes, doing bed, laundry) 0 00 0 0  Shopping 00 0 0 00  Walking level at own pace 0 0 0   Walking up Stairs 0 0 0 0  Total (30-36) Dyspnea Score 0 0 0 0      Non-dyspnea symptoms (0-> 5 scale) 10/21/2021 06/23/2022  12/20/2022  06/19/2023   How bad is your cough? 0 0 0 0  How bad is your fatigue 3 0 0 00  How bad is nausea 2 0 0 0  How bad is vomiting?  0 00 0 0  How bad is diarrhea? 0 0 0 0  How bad is anxiety? 4 1 1  0  How bad is depression 1 1 0 0  Any chronic pain - if so where and how bad 0 x 0 0   Simple office walk 185 feet x  3 laps goal with forehead probe 10/21/2021  06/23/2022  12/20/2022   O2 used ra ra ra  Number laps completed  3 Sit and stand x 10  1 laps  Comments about pace avg    Resting Pulse Ox/HR 100% and 85/min 99% and HR 88 97% and HR 64  Final Pulse Ox/HR 98% and 112/min 97% and HR 100 95% and HR 68  Desaturated </= 88% no no   Desaturated <= 3% points no no   Got Tachycardic >/= 90/min yes yes   Symptoms at end of test nonex none   Miscellaneous comments x         SIT STAND TEST - goal 15 times   06/19/2023    O2 used ra   PRobe - finter or forehead figer   Number sit and stand completed - goal 15 15   Time taken to complete x   Resting Pulse Ox/HR/Dyspnea  98% and 83/min and dyspnea of 0/10    Peak measures 100 % and 110/min and dyspnea of 0/10   Final Pulse Ox/HR 97% and 77/min and dyspnea of x/10   Desaturated </= 88% no   Desaturated <= 3% points no   Got Tachycardic >/= 90/min yes   Miscellaneous comments x      PFT     Latest Ref Rng & Units 12/20/2022    9:47 AM 06/23/2022    1:32 PM 01/24/2022    2:07 PM  PFT Results  FVC-Pre L  2.26  2.16  2.12   FVC-Predicted Pre % 94  89  86   Pre FEV1/FVC % % 89  92  89   FEV1-Pre L 2.01  1.99  1.89   FEV1-Predicted Pre % 112  111  104   DLCO uncorrected ml/min/mmHg 16.74  19.47  15.70   DLCO UNC% % 92  107  86   DLCO corrected ml/min/mmHg 16.74  19.47  15.70   DLCO COR %Predicted % 92  107  86   DLVA Predicted % 110  118  110   TLC L   4.98   TLC % Predicted %   101   RV % Predicted %   90     LINICAL DATA:  Interstitial lung disease.   EXAM: CT CHEST WITHOUT CONTRAST   TECHNIQUE: Multidetector CT imaging of the chest was performed following the standard protocol without intravenous contrast. High resolution imaging of the lungs, as well as inspiratory and expiratory imaging, was performed.   RADIATION DOSE REDUCTION: This exam was performed according to the departmental dose-optimization program which includes automated exposure control, adjustment of the mA and/or kV according to patient size and/or use of iterative reconstruction technique.   COMPARISON:  09/30/2021, 10/12/2020.   FINDINGS: Cardiovascular: Atherosclerotic calcification of the aorta, aortic valve and coronary arteries. Ascending aorta measures 3.8 cm (coronal image 33). Heart is at the upper limits of normal in size. No pericardial effusion.   Mediastinum/Nodes: No pathologically enlarged mediastinal or axillary lymph nodes. Hilar regions are difficult to definitively evaluate without IV contrast. Esophagus is grossly unremarkable.   Lungs/Pleura: Subpleural reticulation, coarsened ground-glass and mild traction bronchiectasis/bronchiolectasis are seen in a mild craniocaudal gradient, unchanged from 09/30/2021. No architectural distortion or honeycombing. Calcified granulomas. No pleural fluid. Airway is unremarkable. Mild air trapping.   Upper Abdomen: Tiny right hepatic lobe cyst. No specific follow-up necessary. Gallstones. Visualized portions of the liver, gallbladder, adrenal  glands, kidneys, spleen, pancreas, stomach and bowel are otherwise grossly unremarkable. No upper abdominal adenopathy.   Musculoskeletal: Degenerative changes in the spine.   IMPRESSION: 1. Pulmonary parenchymal pattern of interstitial lung  disease, as detailed above, stable from 09/30/2021. Findings are categorized as probable UIP per consensus guidelines: Diagnosis of Idiopathic Pulmonary Fibrosis: An Official ATS/ERS/JRS/ALAT Clinical Practice Guideline. Am Annie Barton Crit Care Med Vol 198, Iss 5, [phone], Sep 17 2016. 2. Aortic atherosclerosis (ICD10-I70.0). Coronary artery calcification.     Electronically Signed   By: Shearon Denis M.D.   On: 05/24/2023 11:23     LAB RESULTS last 96 hours No results found.       has a past medical history of Hypercholesteremia and Hypertension.   reports that she has quit smoking. Her smoking use included cigarettes. She has been exposed to tobacco smoke. She has never used smokeless tobacco.  History reviewed. No pertinent surgical history.  Allergies  Allergen Reactions   Aspirin Nausea Only   Sulfa Antibiotics Rash    In 1963    Immunization History  Administered Date(s) Administered   Fluad Quad(high Dose 65+) 10/21/2021   Influenza-Unspecified 10/31/2020   PFIZER(Purple Top)SARS-COV-2 Vaccination 02/11/2019, 03/04/2019   Pfizer Covid-19 Vaccine Bivalent Booster 52yrs & up 10/13/2020   Pneumococcal-Unspecified 10/31/2020   RSV,unspecified 01/31/2022    Family History  Problem Relation Age of Onset   Leukemia Mother    Hypertension Mother    Colon cancer Father      Current Outpatient Medications:    atorvastatin (LIPITOR) 10 MG tablet, Take 10 mg by mouth at bedtime., Disp: , Rfl:    Calcium-Vitamin D (CALTRATE 600 PLUS-VIT D PO), Caltrate 600 plus D, Disp: , Rfl:    Cholecalciferol 25 MCG (1000 UT) capsule, Take by mouth., Disp: , Rfl:    Cyanocobalamin  (B-12 COMPLIANCE INJECTION IJ), Inject as directed.  Inject every 3 months, Disp: , Rfl:    cyanocobalamin  (VITAMIN B12) 1000 MCG tablet, Take 1 tablet by mouth daily., Disp: , Rfl:    denosumab  (PROLIA ) 60 MG/ML SOSY injection, first was 8/20 Subcutaneous, Disp: , Rfl:    docusate sodium (COLACE) 50 MG capsule, Take 50 mg by mouth 2 (two) times daily., Disp: , Rfl:    Multiple Vitamins-Minerals (CENTRUM SILVER 50+WOMEN) TABS, Centrum, Disp: , Rfl:    Omega-3 Fatty Acids (FISH OIL) 1200 MG CAPS, , Disp: , Rfl:    telmisartan (MICARDIS) 20 MG tablet, 1/2 TABLET ORALLY ONCE A DAY IN THE EVENING 90 DAYS Orally Once a day for 90 days (Patient taking differently: One tablet daily), Disp: , Rfl:    gabapentin  (NEURONTIN ) 100 MG capsule, Take 1 tablet at bedtime x 1 week, then increase to 2 tablets at bedtime. (Patient not taking: Reported on 06/19/2023), Disp: 60 capsule, Rfl: 3      Objective:   Vitals:   06/19/23 0949  BP: 126/68  Pulse: 74  SpO2: 97%  Weight: 150 lb (68 kg)  Height: 5\' 3"  (1.6 m)    Estimated body mass index is 26.57 kg/m as calculated from the following:   Height as of this encounter: 5\' 3"  (1.6 m).   Weight as of this encounter: 150 lb (68 kg).  @WEIGHTCHANGE @  [employer]   06/19/23 0949  Weight: 150 lb (68 kg)     Physical Exam   General: No distress. Looks well O2 at rest: no Cane present: no Sitting in wheel chair: no Frail: no Obese: no Neuro: Alert and Oriented x 3. GCS 15. Speech normal Psych: Pleasant Resp:  Barrel Chest - no.  Wheeze - no, Crackles - no, No overt respiratory distress CVS: Normal heart sounds. Murmurs - no Ext: Stigmata  of Connective Tissue Disease - no HEENT: Normal upper airway. PEERL +. No post nasal drip        Assessment:       ICD-10-CM   1. ILD (interstitial lung disease) (HCC)  J84.9 Ambulatory referral to Cardiology    Pulmonary function test    2. History of asthma  Z87.09 Ambulatory referral to Cardiology    Pulmonary function test    3. Coronary artery  calcification seen on CAT scan  I25.10 Ambulatory referral to Cardiology    Pulmonary function test    4. Preop respiratory exam  Z01.811 Pulmonary function test         Plan:     Patient Instructions   ILD (interstitial lung disease) (HCC)  - ILD not sure if It was there in 2015  Subtle mild early ILD since 2021 -> 2023 -> stable in May 2025; looks stable.  Plan -Supportive expectant approach with serial monitoring -No antifibrotic or biopsy recommended at this point in time -Do spir and dlco in 6 months   Hx of asthma  -This was started in the fall 2023 because of shortness of breath and you seem to have improvement.  Diagnosis of asthma was clinical and glad you are doing well without the inhaler   plan   Monitor without inhaler   Coroary art calcification on CT  Plan  - refer cardiology   Preop resp exam for potential knee surgery  Low risk for pulmonary complications from knee surgery STandard blood clot prevention needed  Plan  - inform surgeon  Followup  -6 months - 15 min visit; but after  PFT  - walk and ILD symptoms score at followup   FOLLOWUP Return in about 6 months (around 12/19/2023) for 15 min visit, after Spiro and DLCO, with Dr Bertrum Brodie, Face to Face Visit.    SIGNATURE    Dr. Maire Scot, M.D., F.C.C.P,  Pulmonary and Critical Care Medicine Staff Physician, [facility] Director - Interstitial Lung Disease  Program  Pulmonary Fibrosis [facility] at [facility] [place], [place], [address]  Pager: [phone], If no answer or between  15:00h - 7:00h: call [phone] Telephone: [phone]  10:32 AM 06/19/2023

## 2023-06-19 NOTE — Patient Instructions (Addendum)
  ILD (interstitial lung disease) (HCC)  - ILD not sure if It was there in 2015  Subtle mild early ILD since 2021 -> 2023 -> stable in May 2025; looks stable.  Plan -Supportive expectant approach with serial monitoring -No antifibrotic or biopsy recommended at this point in time -Do spir and dlco in 6 months   Hx of asthma  -This was started in the fall 2023 because of shortness of breath and you seem to have improvement.  Diagnosis of asthma was clinical and glad you are doing well without the inhaler   plan   Monitor without inhaler   Coroary art calcification on CT  Plan  - refer cardiology   Preop resp exam for potential knee surgery  Low risk for pulmonary complications from knee surgery STandard blood clot prevention needed  Plan  - inform surgeon  Followup  -6 months - 15 min visit; but after  PFT  - walk and ILD symptoms score at followup

## 2023-07-04 ENCOUNTER — Telehealth: Payer: Self-pay | Admitting: Internal Medicine

## 2023-07-04 NOTE — Telephone Encounter (Signed)
 Hi, this patient presents to the front because Ramaswamy referred her to cardiology at [facility] and she never got a call from them so she called them and they made her an appt to a different place and she doesn't want to go there, she wants to go to where [facility] referred her. She was wondering if Bertrum Brodie would schedule her or if she could talk to someone about it. Please call asap at [phone].

## 2023-07-04 NOTE — Telephone Encounter (Signed)
 Spoke with pt explained that they are in the same group she thought because Dr Bertrum Brodie told her [facility] she had to go there. Their next available was not until 09/2023 but [facility] location had an appt 08/2023 she did take this appt and was asked to be put on the cancellation list for [facility] location. NFN

## 2023-07-11 DIAGNOSIS — M9905 Segmental and somatic dysfunction of pelvic region: Secondary | ICD-10-CM | POA: Diagnosis not present

## 2023-07-11 DIAGNOSIS — M25552 Pain in left hip: Secondary | ICD-10-CM | POA: Diagnosis not present

## 2023-07-11 DIAGNOSIS — M5116 Intervertebral disc disorders with radiculopathy, lumbar region: Secondary | ICD-10-CM | POA: Diagnosis not present

## 2023-07-11 DIAGNOSIS — M9903 Segmental and somatic dysfunction of lumbar region: Secondary | ICD-10-CM | POA: Diagnosis not present

## 2023-07-12 DIAGNOSIS — M9905 Segmental and somatic dysfunction of pelvic region: Secondary | ICD-10-CM | POA: Diagnosis not present

## 2023-07-12 DIAGNOSIS — M9903 Segmental and somatic dysfunction of lumbar region: Secondary | ICD-10-CM | POA: Diagnosis not present

## 2023-07-12 DIAGNOSIS — M25552 Pain in left hip: Secondary | ICD-10-CM | POA: Diagnosis not present

## 2023-07-12 DIAGNOSIS — M5116 Intervertebral disc disorders with radiculopathy, lumbar region: Secondary | ICD-10-CM | POA: Diagnosis not present

## 2023-07-13 DIAGNOSIS — M9905 Segmental and somatic dysfunction of pelvic region: Secondary | ICD-10-CM | POA: Diagnosis not present

## 2023-07-13 DIAGNOSIS — M5116 Intervertebral disc disorders with radiculopathy, lumbar region: Secondary | ICD-10-CM | POA: Diagnosis not present

## 2023-07-13 DIAGNOSIS — M9903 Segmental and somatic dysfunction of lumbar region: Secondary | ICD-10-CM | POA: Diagnosis not present

## 2023-07-13 DIAGNOSIS — M25552 Pain in left hip: Secondary | ICD-10-CM | POA: Diagnosis not present

## 2023-07-17 DIAGNOSIS — M5116 Intervertebral disc disorders with radiculopathy, lumbar region: Secondary | ICD-10-CM | POA: Diagnosis not present

## 2023-07-17 DIAGNOSIS — M9903 Segmental and somatic dysfunction of lumbar region: Secondary | ICD-10-CM | POA: Diagnosis not present

## 2023-07-17 DIAGNOSIS — M25552 Pain in left hip: Secondary | ICD-10-CM | POA: Diagnosis not present

## 2023-07-17 DIAGNOSIS — M9905 Segmental and somatic dysfunction of pelvic region: Secondary | ICD-10-CM | POA: Diagnosis not present

## 2023-07-18 DIAGNOSIS — M9905 Segmental and somatic dysfunction of pelvic region: Secondary | ICD-10-CM | POA: Diagnosis not present

## 2023-07-18 DIAGNOSIS — M9903 Segmental and somatic dysfunction of lumbar region: Secondary | ICD-10-CM | POA: Diagnosis not present

## 2023-07-18 DIAGNOSIS — M5116 Intervertebral disc disorders with radiculopathy, lumbar region: Secondary | ICD-10-CM | POA: Diagnosis not present

## 2023-07-18 DIAGNOSIS — M25552 Pain in left hip: Secondary | ICD-10-CM | POA: Diagnosis not present

## 2023-07-19 DIAGNOSIS — M9905 Segmental and somatic dysfunction of pelvic region: Secondary | ICD-10-CM | POA: Diagnosis not present

## 2023-07-19 DIAGNOSIS — M9903 Segmental and somatic dysfunction of lumbar region: Secondary | ICD-10-CM | POA: Diagnosis not present

## 2023-07-19 DIAGNOSIS — M25552 Pain in left hip: Secondary | ICD-10-CM | POA: Diagnosis not present

## 2023-07-19 DIAGNOSIS — M5116 Intervertebral disc disorders with radiculopathy, lumbar region: Secondary | ICD-10-CM | POA: Diagnosis not present

## 2023-07-20 DIAGNOSIS — I10 Essential (primary) hypertension: Secondary | ICD-10-CM | POA: Diagnosis not present

## 2023-07-20 DIAGNOSIS — E039 Hypothyroidism, unspecified: Secondary | ICD-10-CM | POA: Diagnosis not present

## 2023-07-20 DIAGNOSIS — E785 Hyperlipidemia, unspecified: Secondary | ICD-10-CM | POA: Diagnosis not present

## 2023-07-20 DIAGNOSIS — I2510 Atherosclerotic heart disease of native coronary artery without angina pectoris: Secondary | ICD-10-CM | POA: Diagnosis not present

## 2023-07-25 DIAGNOSIS — M25552 Pain in left hip: Secondary | ICD-10-CM | POA: Diagnosis not present

## 2023-07-25 DIAGNOSIS — M5116 Intervertebral disc disorders with radiculopathy, lumbar region: Secondary | ICD-10-CM | POA: Diagnosis not present

## 2023-07-25 DIAGNOSIS — M9903 Segmental and somatic dysfunction of lumbar region: Secondary | ICD-10-CM | POA: Diagnosis not present

## 2023-07-25 DIAGNOSIS — M9905 Segmental and somatic dysfunction of pelvic region: Secondary | ICD-10-CM | POA: Diagnosis not present

## 2023-07-26 DIAGNOSIS — M5116 Intervertebral disc disorders with radiculopathy, lumbar region: Secondary | ICD-10-CM | POA: Diagnosis not present

## 2023-07-26 DIAGNOSIS — M9903 Segmental and somatic dysfunction of lumbar region: Secondary | ICD-10-CM | POA: Diagnosis not present

## 2023-07-26 DIAGNOSIS — M25552 Pain in left hip: Secondary | ICD-10-CM | POA: Diagnosis not present

## 2023-07-26 DIAGNOSIS — M9905 Segmental and somatic dysfunction of pelvic region: Secondary | ICD-10-CM | POA: Diagnosis not present

## 2023-07-27 ENCOUNTER — Telehealth (HOSPITAL_COMMUNITY): Payer: Self-pay | Admitting: Pharmacy Technician

## 2023-07-27 NOTE — Telephone Encounter (Signed)
.  Auth Submission: APPROVED Site of care: Site of care: [facility] Payer: Aetna Medicare, Aetna [facility] Medication & CPT/J Code(s) submitted: Prolia  (Denosumab ) [id number] Diagnosis Code: M81.0 Route of submission (phone, fax, portal):  Phone # Fax # Auth type: Buy/Bill HB Units/visits requested: 60mg  x 2 doses Reference number: [id number] Approval from: 07/24/23 to 07/23/24   [employer] Medicare is primary, [facility] is secondary and will cover any remaining balance.  Jill Stopka, CPhT [facility] [phone]

## 2023-07-31 ENCOUNTER — Other Ambulatory Visit (HOSPITAL_COMMUNITY): Payer: Self-pay

## 2023-08-01 ENCOUNTER — Ambulatory Visit (HOSPITAL_COMMUNITY)
Admission: RE | Admit: 2023-08-01 | Discharge: 2023-08-01 | Disposition: A | Discharge status: Home / Self Care | Source: Ambulatory Visit | Attending: Internal Medicine | Admitting: Internal Medicine

## 2023-08-01 DIAGNOSIS — M9905 Segmental and somatic dysfunction of pelvic region: Secondary | ICD-10-CM | POA: Diagnosis not present

## 2023-08-01 DIAGNOSIS — M5116 Intervertebral disc disorders with radiculopathy, lumbar region: Secondary | ICD-10-CM | POA: Diagnosis not present

## 2023-08-01 DIAGNOSIS — M25552 Pain in left hip: Secondary | ICD-10-CM | POA: Diagnosis not present

## 2023-08-01 DIAGNOSIS — M9903 Segmental and somatic dysfunction of lumbar region: Secondary | ICD-10-CM | POA: Diagnosis not present

## 2023-08-01 DIAGNOSIS — M810 Age-related osteoporosis without current pathological fracture: Secondary | ICD-10-CM | POA: Diagnosis not present

## 2023-08-01 MED ORDER — DENOSUMAB 60 MG/ML ~~LOC~~ SOSY
PREFILLED_SYRINGE | SUBCUTANEOUS | Status: AC
  Filled 2023-08-01: qty 1

## 2023-08-01 MED ORDER — DENOSUMAB 60 MG/ML ~~LOC~~ SOSY
60.0000 mg | PREFILLED_SYRINGE | Freq: Once | SUBCUTANEOUS | Status: AC
  Administered 2023-08-01: 60 mg via SUBCUTANEOUS

## 2023-08-02 DIAGNOSIS — M9903 Segmental and somatic dysfunction of lumbar region: Secondary | ICD-10-CM | POA: Diagnosis not present

## 2023-08-02 DIAGNOSIS — M25552 Pain in left hip: Secondary | ICD-10-CM | POA: Diagnosis not present

## 2023-08-02 DIAGNOSIS — M5116 Intervertebral disc disorders with radiculopathy, lumbar region: Secondary | ICD-10-CM | POA: Diagnosis not present

## 2023-08-02 DIAGNOSIS — M9905 Segmental and somatic dysfunction of pelvic region: Secondary | ICD-10-CM | POA: Diagnosis not present

## 2023-08-03 DIAGNOSIS — M5116 Intervertebral disc disorders with radiculopathy, lumbar region: Secondary | ICD-10-CM | POA: Diagnosis not present

## 2023-08-03 DIAGNOSIS — M9905 Segmental and somatic dysfunction of pelvic region: Secondary | ICD-10-CM | POA: Diagnosis not present

## 2023-08-03 DIAGNOSIS — M9903 Segmental and somatic dysfunction of lumbar region: Secondary | ICD-10-CM | POA: Diagnosis not present

## 2023-08-03 DIAGNOSIS — M25552 Pain in left hip: Secondary | ICD-10-CM | POA: Diagnosis not present

## 2023-08-07 DIAGNOSIS — M5116 Intervertebral disc disorders with radiculopathy, lumbar region: Secondary | ICD-10-CM | POA: Diagnosis not present

## 2023-08-07 DIAGNOSIS — M25552 Pain in left hip: Secondary | ICD-10-CM | POA: Diagnosis not present

## 2023-08-07 DIAGNOSIS — M9903 Segmental and somatic dysfunction of lumbar region: Secondary | ICD-10-CM | POA: Diagnosis not present

## 2023-08-07 DIAGNOSIS — M9905 Segmental and somatic dysfunction of pelvic region: Secondary | ICD-10-CM | POA: Diagnosis not present

## 2023-08-09 DIAGNOSIS — M9903 Segmental and somatic dysfunction of lumbar region: Secondary | ICD-10-CM | POA: Diagnosis not present

## 2023-08-09 DIAGNOSIS — M9905 Segmental and somatic dysfunction of pelvic region: Secondary | ICD-10-CM | POA: Diagnosis not present

## 2023-08-09 DIAGNOSIS — M25552 Pain in left hip: Secondary | ICD-10-CM | POA: Diagnosis not present

## 2023-08-09 DIAGNOSIS — M5116 Intervertebral disc disorders with radiculopathy, lumbar region: Secondary | ICD-10-CM | POA: Diagnosis not present

## 2023-08-10 DIAGNOSIS — M9905 Segmental and somatic dysfunction of pelvic region: Secondary | ICD-10-CM | POA: Diagnosis not present

## 2023-08-10 DIAGNOSIS — M9903 Segmental and somatic dysfunction of lumbar region: Secondary | ICD-10-CM | POA: Diagnosis not present

## 2023-08-10 DIAGNOSIS — M5116 Intervertebral disc disorders with radiculopathy, lumbar region: Secondary | ICD-10-CM | POA: Diagnosis not present

## 2023-08-10 DIAGNOSIS — M25552 Pain in left hip: Secondary | ICD-10-CM | POA: Diagnosis not present

## 2023-08-21 DIAGNOSIS — M9905 Segmental and somatic dysfunction of pelvic region: Secondary | ICD-10-CM | POA: Diagnosis not present

## 2023-08-21 DIAGNOSIS — M9903 Segmental and somatic dysfunction of lumbar region: Secondary | ICD-10-CM | POA: Diagnosis not present

## 2023-08-21 DIAGNOSIS — M25552 Pain in left hip: Secondary | ICD-10-CM | POA: Diagnosis not present

## 2023-08-21 DIAGNOSIS — M5116 Intervertebral disc disorders with radiculopathy, lumbar region: Secondary | ICD-10-CM | POA: Diagnosis not present

## 2023-08-22 ENCOUNTER — Ambulatory Visit (INDEPENDENT_AMBULATORY_CARE_PROVIDER_SITE_OTHER): Admitting: Neurology

## 2023-08-22 ENCOUNTER — Encounter: Payer: Self-pay | Admitting: Neurology

## 2023-08-22 VITALS — BP 135/85 | HR 80 | Ht 63.0 in | Wt 150.0 lb

## 2023-08-22 DIAGNOSIS — M5116 Intervertebral disc disorders with radiculopathy, lumbar region: Secondary | ICD-10-CM | POA: Diagnosis not present

## 2023-08-22 DIAGNOSIS — M25552 Pain in left hip: Secondary | ICD-10-CM | POA: Diagnosis not present

## 2023-08-22 DIAGNOSIS — M48061 Spinal stenosis, lumbar region without neurogenic claudication: Secondary | ICD-10-CM | POA: Diagnosis not present

## 2023-08-22 DIAGNOSIS — M9903 Segmental and somatic dysfunction of lumbar region: Secondary | ICD-10-CM | POA: Diagnosis not present

## 2023-08-22 DIAGNOSIS — G2581 Restless legs syndrome: Secondary | ICD-10-CM

## 2023-08-22 DIAGNOSIS — M9905 Segmental and somatic dysfunction of pelvic region: Secondary | ICD-10-CM | POA: Diagnosis not present

## 2023-08-22 NOTE — Patient Instructions (Addendum)
 Referral to [facility] for core strengthening

## 2023-08-22 NOTE — Progress Notes (Signed)
 Follow-up Visit   Date: 08/22/2023    Melissa Tapia MRN: [id number] DOB: 1941/01/08    Melissa Tapia is a 83 y.o. left-handed Caucasian female with  interstitial lung disease, hypertension, and hyperlipidemia returning to the clinic for follow-up of bilateral feet pain.  The patient was accompanied to the clinic by self.  IMPRESSION/PLAN: Likely restless leg syndrome with nighttime feet discomfort. Nerve testing is normal and does not show neuropathy.  Ferritin is normal.  Medication was declined as symptoms are not bothersome enough. Going forward, consider ropinirole.    Lumbosacral degenerative changes with lumbar canal stenosis at L2-3 and L4-5 (moderate).  She tried PT but due to worsening back pain, it was stopped. Today, she endorses core weakness and would like to try [facility].  Referral will be sent.   Return to clinic in 6 months  --------------------------------------------- History of present illness: Bilateral feet paresthesias and pain with associated lumbar pain.  Her exam shows intact distal sensation with increased patella jerks and reduced S1 reflexes. These findings may suggest that she has lumbosacral degenerative changes and S1 radiculopathy may explain her feet paresthesias. I recommend PT for low back strengthening and trial of gabapentin  100mg  at bedtime x 1 week, then increase to 200mg  at bedtime.  I discussed NCS/EMG to further evaluate her symptoms, which was mutually decided to consider only if her symptoms do not improve with PT. With her intact distal sensation and intermittent symptoms, neuropathy is less likely.  She has claustrophobia and does not want to have imaging at this time.    Return to clinic in 3-4 months, or sooner as needed  UPDATE 04/26/2023:  She wakes up about twice at night with her ankles and soles of the feet with discomfort.  She often wakes up and walks around to get relief.  She is able to go back to sleep without problems.   She no longer has prickly sensation of the feet.  In the morning, she has some numbness of the toes.  No problems with weakness or balance.   She completed 7 session of PT which worsened low back pain, so stopped it.  She no longer has low back pain. She had MRI lumbar spine which shows mild spinal stenosis at L2-3 and moderate biforaminal stenosis as well as moderate spinal stenosis L3-4 and L4-5.    UPDATE 08/22/2023:  She is here for follow-up visit.  She continues to have right hip pain which wakes her up at night.  Sometimes, it radiates into her thigh and leg.   She has mild discomfort in the feet which is worse at night time.  She no longer has prickly sensation of the legs and feet.  She feels that her core is weak and would like exercises to strength then.    Medications:  Current Outpatient Medications on File Prior to Visit  Medication Sig Dispense Refill   atorvastatin (LIPITOR) 10 MG tablet Take 10 mg by mouth at bedtime.     Calcium-Vitamin D (CALTRATE 600 PLUS-VIT D PO) Caltrate 600 plus D     Cholecalciferol 25 MCG (1000 UT) capsule Take by mouth.     Cyanocobalamin  (B-12 COMPLIANCE INJECTION IJ) Inject as directed. Inject every 3 months     cyanocobalamin  (VITAMIN B12) 1000 MCG tablet Take 1 tablet by mouth daily.     denosumab  (PROLIA ) 60 MG/ML SOSY injection first was 8/20 Subcutaneous     docusate sodium (COLACE) 50 MG capsule Take 50 mg  by mouth 2 (two) times daily.     Multiple Vitamins-Minerals (CENTRUM SILVER 50+WOMEN) TABS Centrum     Omega-3 Fatty Acids (FISH OIL) 1200 MG CAPS      telmisartan (MICARDIS) 20 MG tablet 1/2 TABLET ORALLY ONCE A DAY IN THE EVENING 90 DAYS Orally Once a day for 90 days     gabapentin  (NEURONTIN ) 100 MG capsule Take 1 tablet at bedtime x 1 week, then increase to 2 tablets at bedtime. (Patient not taking: Reported on 06/19/2023) 60 capsule 3   No current facility-administered medications on file prior to visit.    Allergies:  Allergies   Allergen Reactions   Aspirin Nausea Only   Sulfa Antibiotics Rash    In 1963    Vital Signs:  BP 135/85 (BP Location: Left Arm, Patient Position: Sitting)   Pulse 80   Ht 5' 3 (1.6 m)   Wt 150 lb (68 kg)   SpO2 97%   BMI 26.57 kg/m   Neurological Exam: MENTAL STATUS including orientation to time, place, person, recent and remote memory, attention span and concentration, language, and fund of knowledge is normal.  Speech is not dysarthric.  CRANIAL NERVES:  Pupils equal round and reactive to light.  Normal conjugate, extra-ocular eye movements in all directions of gaze.  No ptosis.  Face is symmetric. Palate elevates symmetrically.  Tongue is midline.  MOTOR:  Motor strength is 5/5 in all extremities.  No atrophy, fasciculations or abnormal movements.  No pronator drift.  Tone is normal.    MSRs:                                           Right        Left brachioradialis 2+  2+  biceps 2+  2+  triceps 2+  2+  patellar 3+  3+  ankle jerk 1+  1+   SENSORY:  Intact to vibration throughout.  COORDINATION/GAIT:  Normal finger-to- nose-finger.  Intact rapid alternating movements bilaterally.  Gait is mildly stopped, narrow based and stable.   Data: MRI lumbar spine 04/11/2023: Mild spinal stenosis at L2-3 and moderate biforaminal stenosis  Moderate spinal stenosis L3-4 and L4-5. Old compression fracture L2.  NCS/EMG of the legs 05/25/2023:  Normal  Lab Results  Component Value Date   FERRITIN 170 04/26/2023     Thank you for allowing me to participate in patient's care.  If I can answer any additional questions, I would be pleased to do so.    Sincerely,    Marrissa Dai K. Tobie, DO

## 2023-08-23 ENCOUNTER — Telehealth: Payer: Self-pay | Admitting: Neurology

## 2023-08-23 NOTE — Telephone Encounter (Signed)
 Physical therapy needs new referral missing 2/3 insurance and demographics

## 2023-08-23 NOTE — Telephone Encounter (Signed)
 Referral has been re faxed with missing documents.

## 2023-09-13 ENCOUNTER — Ambulatory Visit: Attending: Cardiovascular Disease | Admitting: Cardiology

## 2023-09-13 ENCOUNTER — Ambulatory Visit (HOSPITAL_COMMUNITY)
Admission: RE | Admit: 2023-09-13 | Discharge: 2023-09-13 | Disposition: A | Discharge status: Home / Self Care | Payer: Self-pay | Source: Ambulatory Visit | Attending: Cardiology | Admitting: Cardiology

## 2023-09-13 ENCOUNTER — Encounter: Payer: Self-pay | Admitting: Cardiology

## 2023-09-13 VITALS — BP 160/96 | HR 68 | Resp 16 | Ht 63.0 in | Wt 152.8 lb

## 2023-09-13 DIAGNOSIS — I700 Atherosclerosis of aorta: Secondary | ICD-10-CM

## 2023-09-13 DIAGNOSIS — Z87891 Personal history of nicotine dependence: Secondary | ICD-10-CM

## 2023-09-13 DIAGNOSIS — E782 Mixed hyperlipidemia: Secondary | ICD-10-CM

## 2023-09-13 DIAGNOSIS — I10 Essential (primary) hypertension: Secondary | ICD-10-CM | POA: Diagnosis not present

## 2023-09-13 DIAGNOSIS — I2510 Atherosclerotic heart disease of native coronary artery without angina pectoris: Secondary | ICD-10-CM | POA: Diagnosis not present

## 2023-09-13 MED ORDER — ATORVASTATIN CALCIUM 20 MG PO TABS: 20.0000 mg | ORAL_TABLET | Freq: Every day | ORAL | 3 refills | Status: AC

## 2023-09-13 NOTE — Progress Notes (Signed)
 Cardiology Office Note:    NAME:  Melissa Tapia    MRN: [id number] DOB:  1940/06/06   PCP:  Shayne Anes, MD  Former Cardiology Providers: None Primary Cardiologist:  Madonna Large, DO, [facility] (established care 09/13/2023) Electrophysiologist:  None   Referring MD: Geronimo Amel, MD  Reason of Consult: Coronary artery calcification  Chief Complaint  Patient presents with    Coronary artery calcification seen on CAT scan   New Patient (Initial Visit)    History of Present Illness:    Melissa Tapia is a 83 y.o. 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 She is being seen today for the evaluation of Coronary artery calcification at the request of Geronimo Amel, MD.  Dyspnea and pulmonary findings - History of interstitial lung disease with lung scarring - Shortness of breath in 2023 attributed to a medication reaction; no current symptoms of dyspnea - No current use of medications for pulmonary symptoms - Regular follow-up with pulmonary specialist every six months - Recent CT scan performed to assess for progression of lung scarring also reported coronary artery calcification for which she is referred to cardiology.   Cardiac symptoms and evaluation - No chest pain, shortness of breath, lightheadedness, dizziness, or syncope - Referred for cardiac evaluation following recent CT scan - [facility] screening in 2023 showed mild plaque buildup - Physical activity is limited due to knee arthritis   Hypercholesterolemia - Longstanding hypercholesterolemia - On atorvastatin  10 mg daily - Cholesterol last checked in December 2024  Current Medications: Current Meds  Medication Sig   Calcium -Vitamin D (CALTRATE 600 PLUS-VIT D PO) Caltrate 600  plus D   Cholecalciferol 25 MCG (1000 UT) capsule Take by mouth.   Cyanocobalamin  (B-12 COMPLIANCE INJECTION IJ) Inject as directed. Inject every 3 months   cyanocobalamin  (VITAMIN B12) 1000 MCG tablet Take 1 tablet by mouth daily.   denosumab  (PROLIA ) 60 MG/ML SOSY injection first was 8/20 Subcutaneous   docusate sodium (COLACE) 50 MG capsule Take 50 mg by mouth 2 (two) times daily.   Multiple Vitamins-Minerals (CENTRUM SILVER 50+WOMEN) TABS Centrum   Omega-3 Fatty Acids (FISH OIL) 1200 MG CAPS    telmisartan (MICARDIS) 20 MG tablet 1/2 TABLET ORALLY ONCE A DAY IN THE EVENING 90 DAYS Orally Once a day for 90 days   [DISCONTINUED] atorvastatin  (LIPITOR) 10 MG tablet Take 10 mg by mouth at bedtime.     Allergies:    Aspirin and Sulfa antibiotics   Past Medical History: Past Medical History:  Diagnosis Date   Hypercholesteremia    Hypertension     Past Surgical History: History reviewed. No pertinent surgical history.  Social History: Social History   Tobacco Use   Smoking status: Former    Types: Cigarettes    Passive exposure: Past   Smokeless tobacco: Never   Tobacco comments:    Smoked some in college socially.  Vaping Use   Vaping status: Never Used  Substance Use Topics   Alcohol  use: Never   Drug use: Never    Family History: Family History  Problem Relation Age of Onset   Leukemia Mother    Hypertension Mother    Colon cancer Father     ROS:   Review of Systems  Cardiovascular:  Negative for chest pain, claudication, irregular heartbeat, leg swelling, near-syncope, orthopnea, palpitations, paroxysmal nocturnal dyspnea  and syncope.  Respiratory:  Negative for shortness of breath.   Hematologic/Lymphatic: Negative for bleeding problem.    EKGs/Labs/Other Studies Reviewed:   EKG: EKG Interpretation Date/Time:  Wednesday September 13 2023 15:00:45 EDT Ventricular Rate:  72 PR Interval:  152 QRS Duration:  76 QT Interval:  380 QTC Calculation: 416 R  Axis:   -42  Text Interpretation: Normal sinus rhythm Left axis deviation Nonspecific T wave abnormality When compared with ECG of 14-Aug-2021 08:07, No significant change since last tracing Confirmed by Michele Richardson [phone]) on 09/13/2023 3:06:07 PM  Echocardiogram: 11/15/2021  1. Left ventricular ejection fraction, by estimation, is 60 to 65%. Left  ventricular ejection fraction by 3D volume is 65 %. The left ventricle has  normal function. The left ventricle has no regional wall motion  abnormalities. Left ventricular diastolic   parameters are consistent with Grade I diastolic dysfunction (impaired  relaxation).   2. Right ventricular systolic function is normal. The right ventricular  size is normal. There is normal pulmonary artery systolic pressure.   3. The mitral valve is normal in structure. No evidence of mitral valve  regurgitation. No evidence of mitral stenosis.   4. The aortic valve is tricuspid. Aortic valve regurgitation is not  visualized. No aortic stenosis is present.   5. The inferior vena cava is normal in size with greater than 50%  respiratory variability, suggesting right atrial pressure of 3 mmHg.   Stress Testing:  None  Coronary artery calcium  scoring: 09/11/2019 CORONARY CALCIUM  SCORES:   Left Main: 0   LAD: 4.8   LCx: 0   RCA: 0   Total Agatston Score: 4.8   MESA database percentile: 18   AORTA MEASUREMENTS:   Ascending Aorta: 41 mm  Labs:    Latest Ref Rng & Units 08/14/2021    8:04 AM  CBC  WBC 4.0 - 10.5 K/uL 6.4   Hemoglobin 12.0 - 15.0 g/dL 86.0   Hematocrit 63.9 - 46.0 % 40.3   Platelets 150 - 400 K/uL 219        Latest Ref Rng & Units 08/14/2021    8:04 AM  BMP  Glucose 70 - 99 mg/dL 881   BUN 8 - 23 mg/dL 14   Creatinine 9.55 - 1.00 mg/dL 9.06   Sodium 864 - 854 mmol/L 143   Potassium 3.5 - 5.1 mmol/L 3.6   Chloride 98 - 111 mmol/L 107   CO2 22 - 32 mmol/L 26   Calcium  8.9 - 10.3 mg/dL 89.9       Latest Ref Rng &  Units 08/14/2021    8:04 AM  CMP  Glucose 70 - 99 mg/dL 881   BUN 8 - 23 mg/dL 14   Creatinine 9.55 - 1.00 mg/dL 9.06   Sodium 864 - 854 mmol/L 143   Potassium 3.5 - 5.1 mmol/L 3.6   Chloride 98 - 111 mmol/L 107   CO2 22 - 32 mmol/L 26   Calcium  8.9 - 10.3 mg/dL 89.9   Total Protein 6.5 - 8.1 g/dL 6.7   Total Bilirubin 0.3 - 1.2 mg/dL 1.5   Alkaline Phos 38 - 126 U/L 58   AST 15 - 41 U/L 25   ALT 0 - 44 U/L 20     No results found for: CHOL, HDL, LDLCALC, LDLDIRECT, TRIG, CHOLHDL No results for input(s): LIPOA in the last 8760 hours. No components found for: NTPROBNP No results for input(s): PROBNP in the last 8760 hours. No results for input(s):  TSH in the last 8760 hours.  Physical Exam:    Today's Vitals   09/13/23 1457  BP: (!) 160/96  Pulse: 68  Resp: 16  SpO2: 98%  Weight: 152 lb 12.8 oz (69.3 kg)  Height: 5' 3 (1.6 m)   Body mass index is 27.07 kg/m. Wt Readings from Last 3 Encounters:  09/13/23 152 lb 12.8 oz (69.3 kg)  08/22/23 150 lb (68 kg)  06/19/23 150 lb (68 kg)    Physical Exam  Constitutional: No distress.  hemodynamically stable  Neck: No JVD present.  Cardiovascular: Normal rate, regular rhythm, S1 normal and S2 normal. Exam reveals no gallop, no S3 and no S4.  No murmur heard. Pulmonary/Chest: Effort normal and breath sounds normal. No stridor. She has no wheezes. She has no rales.  Musculoskeletal:        General: No edema.     Cervical back: Neck supple.  Skin: Skin is warm.   Impression & Recommendation(s):  Impression:   ICD-10-CM   1. Coronary artery calcification seen on CAT scan  I25.10 EKG 12-Lead    CT CARDIAC SCORING (SELF PAY ONLY)    ECHOCARDIOGRAM COMPLETE    2. Atherosclerosis of aorta (HCC)  I70.0     3. Benign hypertension  I10     4. Mixed hyperlipidemia  E78.2 Lipid panel    Comprehensive metabolic panel with GFR    atorvastatin  (LIPITOR) 20 MG tablet    Lipid panel    Comprehensive metabolic  panel with GFR    5. Former smoker  Z87.891        Recommendation(s):  Coronary artery calcification seen on CAT scan Atherosclerosis of aorta (HCC) Asymptomatic  Will order echo and dedicated calcium  scoring for further risk stratification.  Independently reviewed her CT chest scan she likely him mild CAC.  IF she has severe CAC will likely proceed w/ ischemic evaluation otherwise focus on secondary prevention.  Increase Lipitor to 20 mg po at bedtime and recheck CMP and fasting lipid in 6 weeks.  Re-emphasized on the importance of secondary prevention.   Benign hypertension Office BP not well controlled - likely anxious  Recommended to check her BP at home and review the data w/ PCP  Recommended low salt diet  Currently on Telmisartan 10 mg po qPM.   Mixed hyperlipidemia Currently on Lipitor 10 mg p.o. daily.for many years - will increase the dose as discussed above.    She denies myalgia or other side effects.   Orders Placed:  Orders Placed This Encounter  Procedures   CT CARDIAC SCORING (SELF PAY ONLY)    Standing Status:   Future    Number of Occurrences:   1    Expiration Date:   09/12/2024    [facility]   Lipid panel    Standing Status:   Future    Number of Occurrences:   1    Expected Date:   11/25/2023    Expiration Date:   09/12/2024   Comprehensive metabolic panel with GFR    Standing Status:   Future    Number of Occurrences:   1    Expected Date:   11/25/2023    Expiration Date:   09/12/2024   EKG 12-Lead   ECHOCARDIOGRAM COMPLETE    Standing Status:   Future    Expiration Date:   09/12/2024    Where should this test be performed:   [facility]  Does the patient weigh less than or greater than 250 lbs?:   Patient weighs less than 250 lbs    Perflutren DEFINITY (image enhancing agent) should be administered unless hypersensitivity or allergy exist:   Administer Perflutren    Reason for exam-Echo:    Other-Full Diagnosis List    Full ICD-10/Reason for Exam:   Coronary artery calcification [8805182]     Final Medication List:    Meds ordered this encounter  Medications   atorvastatin  (LIPITOR) 20 MG tablet    Sig: Take 1 tablet (20 mg total) by mouth at bedtime.    Dispense:  90 tablet    Refill:  3    Medications Discontinued During This Encounter  Medication Reason   gabapentin  (NEURONTIN ) 100 MG capsule Prescription never filled   atorvastatin  (LIPITOR) 10 MG tablet Reorder     Current Outpatient Medications:    Calcium -Vitamin D (CALTRATE 600 PLUS-VIT D PO), Caltrate 600 plus D, Disp: , Rfl:    Cholecalciferol 25 MCG (1000 UT) capsule, Take by mouth., Disp: , Rfl:    Cyanocobalamin  (B-12 COMPLIANCE INJECTION IJ), Inject as directed. Inject every 3 months, Disp: , Rfl:    cyanocobalamin  (VITAMIN B12) 1000 MCG tablet, Take 1 tablet by mouth daily., Disp: , Rfl:    denosumab  (PROLIA ) 60 MG/ML SOSY injection, first was 8/20 Subcutaneous, Disp: , Rfl:    docusate sodium (COLACE) 50 MG capsule, Take 50 mg by mouth 2 (two) times daily., Disp: , Rfl:    Multiple Vitamins-Minerals (CENTRUM SILVER 50+WOMEN) TABS, Centrum, Disp: , Rfl:    Omega-3 Fatty Acids (FISH OIL) 1200 MG CAPS, , Disp: , Rfl:    telmisartan (MICARDIS) 20 MG tablet, 1/2 TABLET ORALLY ONCE A DAY IN THE EVENING 90 DAYS Orally Once a day for 90 days, Disp: , Rfl:    atorvastatin  (LIPITOR) 20 MG tablet, Take 1 tablet (20 mg total) by mouth at bedtime., Disp: 90 tablet, Rfl: 3  Consent:   NA  Disposition:   1 year  Patient may be asked to follow-up sooner based on the results of the above-mentioned testing.  Her questions and concerns were addressed to her satisfaction. She voices understanding of the recommendations provided during this encounter.    Signed, Madonna Michele HAS, [facility]  A Division of [facility] [address], [place], [place] [address]

## 2023-09-13 NOTE — Patient Instructions (Addendum)
 Medication Instructions:  INCREASE Lipitor to 20 mg daily at night  *If you need a refill on your cardiac medications before your next appointment, please call your pharmacy*  Lab Work: FASTING LIPID PANEL AND CMP IN 6 WEEKS  If you have labs (blood work) drawn today and your tests are completely normal, you will receive your results only by: MyChart Message (if you have MyChart) OR A paper copy in the mail If you have any lab test that is abnormal or we need to change your treatment, we will call you to review the results.  Testing/Procedures: ECHOCARDIOGRAM Your physician has requested that you have an echocardiogram. Echocardiography is a painless test that uses sound waves to create images of your heart. It provides your doctor with information about the size and shape of your heart and how well your heart's chambers and valves are working. This procedure takes approximately one hour. There are no restrictions for this procedure. Please do NOT wear cologne, perfume, aftershave, or lotions (deodorant is allowed). Please arrive 15 minutes prior to your appointment time.  Please note: We ask at that you not bring children with you during ultrasound (echo/ vascular) testing. Due to room size and safety concerns, children are not allowed in the ultrasound rooms during exams. Our front office staff cannot provide observation of children in our lobby area while testing is being conducted. An adult accompanying a patient to their appointment will only be allowed in the ultrasound room at the discretion of the ultrasound technician under special circumstances. We apologize for any inconvenience.   Coronary Calcium  Score CT Your physician has requested that you have a coronary calcium  score performed. This is not covered by insurance and will be an out-of-pocket cost of approximately $99.   Follow-Up: At [facility], you and your health needs are our priority.  As part of our continuing  mission to provide you with exceptional heart care, our providers are all part of one team.  This team includes your primary Cardiologist (physician) and Advanced Practice Providers or APPs (Physician Assistants and Nurse Practitioners) who all work together to provide you with the care you need, when you need it.  Your next appointment:   1 Year   Provider:   Madonna Large, DO    We recommend signing up for the patient portal called MyChart.  Sign up information is provided on this After Visit Summary.  MyChart is used to connect with patients for Virtual Visits (Telemedicine).  Patients are able to view lab/test results, encounter notes, upcoming appointments, etc.  Non-urgent messages can be sent to your provider as well.   To learn more about what you can do with MyChart, go to [email or web address].

## 2023-09-18 ENCOUNTER — Encounter: Payer: Self-pay | Admitting: Cardiology

## 2023-09-21 DIAGNOSIS — N8111 Cystocele, midline: Secondary | ICD-10-CM | POA: Diagnosis not present

## 2023-09-21 DIAGNOSIS — N3946 Mixed incontinence: Secondary | ICD-10-CM | POA: Diagnosis not present

## 2023-09-21 DIAGNOSIS — N952 Postmenopausal atrophic vaginitis: Secondary | ICD-10-CM | POA: Diagnosis not present

## 2023-09-23 ENCOUNTER — Ambulatory Visit: Payer: Self-pay | Admitting: Cardiology

## 2023-09-23 DIAGNOSIS — I2510 Atherosclerotic heart disease of native coronary artery without angina pectoris: Secondary | ICD-10-CM

## 2023-09-25 NOTE — Telephone Encounter (Signed)
 Letter of results sent to pt

## 2023-09-27 DIAGNOSIS — M545 Low back pain, unspecified: Secondary | ICD-10-CM | POA: Diagnosis not present

## 2023-09-28 DIAGNOSIS — M810 Age-related osteoporosis without current pathological fracture: Secondary | ICD-10-CM | POA: Diagnosis not present

## 2023-10-02 ENCOUNTER — Ambulatory Visit (INDEPENDENT_AMBULATORY_CARE_PROVIDER_SITE_OTHER): Payer: PRIVATE HEALTH INSURANCE | Admitting: Podiatry

## 2023-10-02 ENCOUNTER — Encounter: Payer: Self-pay | Admitting: Podiatry

## 2023-10-02 DIAGNOSIS — L608 Other nail disorders: Secondary | ICD-10-CM | POA: Diagnosis not present

## 2023-10-02 NOTE — Progress Notes (Signed)
  Subjective:  Patient ID: Melissa Tapia, female    DOB: 29-Dec-1940,   MRN: [id number]  Chief Complaint  Patient presents with   Nail Problem    My big toes, the nails, especially the left one, are turning down into my toes.    83 y.o. female presents for concern of bilateral great toenail and make sure they are ok.  States they hurt in certain shoes.  . Denies any other pedal complaints. Denies n/v/f/c.   Past Medical History:  Diagnosis Date   Hypercholesteremia    Hypertension     Objective:  Physical Exam: Vascular: DP/PT pulses 2/4 bilateral. CFT <3 seconds. Normal hair growth on digits. No edema.  Skin. No lacerations or abrasions bilateral feet. Pincer nail deformity bilateral hallux nails with incurvation bilateral borders and some mild tenderness.  Musculoskeletal: MMT 5/5 bilateral lower extremities in DF, PF, Inversion and Eversion. Deceased ROM in DF of ankle joint.  Neurological: Sensation intact to light touch.   Assessment:   1. Pincer nail deformity       Plan:  Patient was evaluated and treated and all questions answered. Discussed pincer nail deformity and ingrown nails and treatment options.  Debrided hallux nails as courtesy with nail nippers today without incident.  Advised to continue with regular trimming at salon.  Discussed if she gets to a point were they are consistently painful or infection to return to have nail procedure preformed.  Patient to return as needed.   Asberry Failing, DPM

## 2023-10-03 DIAGNOSIS — M545 Low back pain, unspecified: Secondary | ICD-10-CM | POA: Diagnosis not present

## 2023-10-11 ENCOUNTER — Other Ambulatory Visit (HOSPITAL_COMMUNITY): Payer: Self-pay | Admitting: Internal Medicine

## 2023-10-11 DIAGNOSIS — M810 Age-related osteoporosis without current pathological fracture: Secondary | ICD-10-CM | POA: Insufficient documentation

## 2023-10-16 ENCOUNTER — Ambulatory Visit (HOSPITAL_COMMUNITY)
Admission: RE | Admit: 2023-10-16 | Discharge: 2023-10-16 | Disposition: A | Discharge status: Home / Self Care | Source: Ambulatory Visit | Attending: Cardiology | Admitting: Cardiology

## 2023-10-16 DIAGNOSIS — I2510 Atherosclerotic heart disease of native coronary artery without angina pectoris: Secondary | ICD-10-CM | POA: Insufficient documentation

## 2023-10-17 DIAGNOSIS — M545 Low back pain, unspecified: Secondary | ICD-10-CM | POA: Diagnosis not present

## 2023-10-17 LAB — ECHOCARDIOGRAM COMPLETE
Area-P 1/2: 3.27 cm2
S' Lateral: 2.3 cm

## 2023-10-19 ENCOUNTER — Telehealth: Payer: Self-pay | Admitting: *Deleted

## 2023-10-19 NOTE — Telephone Encounter (Signed)
 Patient presented to the office regarding her Breo inhaler.  She states that initially her PCP, Dr. Shayne prescribed it, but then Dr. Geronimo wanted her on it as well.  She needs to get it refilled and felt it best to have Dr. Geronimo refill it since he is treating her breathing problems.  I advised her that I would look through her chart and as long as Breo 100 is in his OV note, we can renew it for her.  She verified understanding.  Dr. Geronimo, Patient is requesting a refill of her Breo 100, however according to your note she was off of the inhaler during her last OV.  Please advise if ok to refill Breo.  Thank  you.

## 2023-10-22 NOTE — Telephone Encounter (Signed)
 There was a history of asthma and we decided to see how she did without inhaler but now that she wants it pls give her Rx of breo back as she has requested

## 2023-10-23 MED ORDER — FLUTICASONE FUROATE-VILANTEROL 100-25 MCG/ACT IN AEPB: 1.0000 | INHALATION_SPRAY | Freq: Every day | RESPIRATORY_TRACT | 6 refills | Status: DC

## 2023-10-23 NOTE — Telephone Encounter (Signed)
 Prescription for Breo 100 sent to patient's pharmacy per Dr. Geronimo.  Nothing further needed.

## 2023-10-25 DIAGNOSIS — K219 Gastro-esophageal reflux disease without esophagitis: Secondary | ICD-10-CM | POA: Diagnosis not present

## 2023-10-25 DIAGNOSIS — H9313 Tinnitus, bilateral: Secondary | ICD-10-CM | POA: Diagnosis not present

## 2023-10-25 DIAGNOSIS — N3941 Urge incontinence: Secondary | ICD-10-CM | POA: Diagnosis not present

## 2023-10-25 DIAGNOSIS — E538 Deficiency of other specified B group vitamins: Secondary | ICD-10-CM | POA: Diagnosis not present

## 2023-10-25 DIAGNOSIS — N8111 Cystocele, midline: Secondary | ICD-10-CM | POA: Diagnosis not present

## 2023-10-25 DIAGNOSIS — N952 Postmenopausal atrophic vaginitis: Secondary | ICD-10-CM | POA: Diagnosis not present

## 2023-10-25 DIAGNOSIS — R142 Eructation: Secondary | ICD-10-CM | POA: Diagnosis not present

## 2023-11-03 DIAGNOSIS — M545 Low back pain, unspecified: Secondary | ICD-10-CM | POA: Diagnosis not present

## 2023-11-04 DIAGNOSIS — Z23 Encounter for immunization: Secondary | ICD-10-CM | POA: Diagnosis not present

## 2023-11-07 DIAGNOSIS — H18413 Arcus senilis, bilateral: Secondary | ICD-10-CM | POA: Diagnosis not present

## 2023-11-07 DIAGNOSIS — H04123 Dry eye syndrome of bilateral lacrimal glands: Secondary | ICD-10-CM | POA: Diagnosis not present

## 2023-11-07 DIAGNOSIS — Z961 Presence of intraocular lens: Secondary | ICD-10-CM | POA: Diagnosis not present

## 2023-11-07 DIAGNOSIS — H26491 Other secondary cataract, right eye: Secondary | ICD-10-CM | POA: Diagnosis not present

## 2023-11-16 DIAGNOSIS — E782 Mixed hyperlipidemia: Secondary | ICD-10-CM | POA: Diagnosis not present

## 2023-11-16 LAB — COMPREHENSIVE METABOLIC PANEL WITH GFR
ALT: 24 IU/L (ref 0–32)
AST: 28 IU/L (ref 0–40)
Albumin: 4.5 g/dL (ref 3.7–4.7)
Alkaline Phosphatase: 63 IU/L (ref 48–129)
BUN/Creatinine Ratio: 17 (ref 12–28)
BUN: 17 mg/dL (ref 8–27)
Bilirubin Total: 1.4 mg/dL — ABNORMAL HIGH (ref 0.0–1.2)
CO2: 23 mmol/L (ref 20–29)
Calcium: 9.7 mg/dL (ref 8.7–10.3)
Chloride: 105 mmol/L (ref 96–106)
Creatinine, Ser: 1 mg/dL (ref 0.57–1.00)
Globulin, Total: 2.3 g/dL (ref 1.5–4.5)
Glucose: 103 mg/dL — ABNORMAL HIGH (ref 70–99)
Potassium: 4.9 mmol/L (ref 3.5–5.2)
Sodium: 143 mmol/L (ref 134–144)
Total Protein: 6.8 g/dL (ref 6.0–8.5)
eGFR: 56 mL/min/1.73 — ABNORMAL LOW (ref >59)

## 2023-11-16 LAB — LIPID PANEL
Chol/HDL Ratio: 2.7 ratio (ref 0.0–4.4)
Cholesterol, Total: 154 mg/dL (ref 100–199)
HDL: 58 mg/dL (ref >39)
LDL Chol Calc (NIH): 78 mg/dL (ref 0–99)
Triglycerides: 101 mg/dL (ref 0–149)
VLDL Cholesterol Cal: 18 mg/dL (ref 5–40)

## 2023-11-17 DIAGNOSIS — M545 Low back pain, unspecified: Secondary | ICD-10-CM | POA: Diagnosis not present

## 2023-11-20 DIAGNOSIS — Z1231 Encounter for screening mammogram for malignant neoplasm of breast: Secondary | ICD-10-CM | POA: Diagnosis not present

## 2023-11-21 DIAGNOSIS — H903 Sensorineural hearing loss, bilateral: Secondary | ICD-10-CM | POA: Diagnosis not present

## 2023-11-21 DIAGNOSIS — H9313 Tinnitus, bilateral: Secondary | ICD-10-CM | POA: Diagnosis not present

## 2023-11-21 NOTE — Addendum Note (Signed)
 Addended by: LORRENE FEDERICO CROME on: 11/21/2023 04:44 PM   Modules accepted: Orders

## 2023-11-22 DIAGNOSIS — R142 Eructation: Secondary | ICD-10-CM | POA: Diagnosis not present

## 2023-11-22 DIAGNOSIS — K5909 Other constipation: Secondary | ICD-10-CM | POA: Diagnosis not present

## 2023-11-22 DIAGNOSIS — K219 Gastro-esophageal reflux disease without esophagitis: Secondary | ICD-10-CM | POA: Diagnosis not present

## 2023-11-24 NOTE — Telephone Encounter (Signed)
 I am okay if you would like to reduce the dose of lipitor to 10mg  po qday as per your concerns.   Just FYI, the lipids have improved because you are on the current dose. Reducing the dose of Lipitor will likely make your number trend up so please follow up labs to with PCP.   Let me know if I can be of any help in the intermin.   Dr. Yury Schaus

## 2023-12-06 ENCOUNTER — Other Ambulatory Visit: Payer: Self-pay

## 2023-12-06 DIAGNOSIS — R634 Abnormal weight loss: Secondary | ICD-10-CM

## 2023-12-08 ENCOUNTER — Other Ambulatory Visit

## 2023-12-12 ENCOUNTER — Other Ambulatory Visit

## 2023-12-12 ENCOUNTER — Ambulatory Visit
Admission: RE | Admit: 2023-12-12 | Discharge: 2023-12-12 | Disposition: A | Discharge status: Home / Self Care | Source: Ambulatory Visit

## 2023-12-12 DIAGNOSIS — R634 Abnormal weight loss: Secondary | ICD-10-CM

## 2023-12-12 DIAGNOSIS — F418 Other specified anxiety disorders: Secondary | ICD-10-CM | POA: Diagnosis not present

## 2023-12-12 DIAGNOSIS — K8020 Calculus of gallbladder without cholecystitis without obstruction: Secondary | ICD-10-CM | POA: Diagnosis not present

## 2023-12-12 DIAGNOSIS — K219 Gastro-esophageal reflux disease without esophagitis: Secondary | ICD-10-CM | POA: Diagnosis not present

## 2023-12-12 DIAGNOSIS — K5730 Diverticulosis of large intestine without perforation or abscess without bleeding: Secondary | ICD-10-CM | POA: Diagnosis not present

## 2023-12-12 MED ORDER — IOPAMIDOL (ISOVUE-370) INJECTION 76%
80.0000 mL | Freq: Once | INTRAVENOUS | Status: AC | PRN
  Administered 2023-12-12: 80 mL via INTRAVENOUS

## 2023-12-20 DIAGNOSIS — K8050 Calculus of bile duct without cholangitis or cholecystitis without obstruction: Secondary | ICD-10-CM | POA: Diagnosis not present

## 2023-12-21 ENCOUNTER — Other Ambulatory Visit: Payer: Self-pay

## 2023-12-21 DIAGNOSIS — K8050 Calculus of bile duct without cholangitis or cholecystitis without obstruction: Secondary | ICD-10-CM

## 2023-12-22 ENCOUNTER — Encounter

## 2023-12-22 ENCOUNTER — Ambulatory Visit

## 2023-12-22 DIAGNOSIS — I2510 Atherosclerotic heart disease of native coronary artery without angina pectoris: Secondary | ICD-10-CM

## 2023-12-22 DIAGNOSIS — Z8709 Personal history of other diseases of the respiratory system: Secondary | ICD-10-CM

## 2023-12-22 DIAGNOSIS — J849 Interstitial pulmonary disease, unspecified: Secondary | ICD-10-CM | POA: Diagnosis not present

## 2023-12-22 DIAGNOSIS — Z01811 Encounter for preprocedural respiratory examination: Secondary | ICD-10-CM

## 2023-12-22 LAB — PULMONARY FUNCTION TEST
DL/VA % pred: 106 %
DL/VA: 4.34 ml/min/mmHg/L
DLCO cor % pred: 94 %
DLCO cor: 16.97 ml/min/mmHg
DLCO unc % pred: 94 %
DLCO unc: 16.97 ml/min/mmHg
FEF 25-75 Pre: 3.3 L/s
FEF2575-%Pred-Pre: 275 %
FEV1-%Pred-Pre: 114 %
FEV1-Pre: 1.98 L
FEV1FVC-%Pred-Pre: 118 %
FEV6-%Pred-Pre: 102 %
FEV6-Pre: 2.28 L
FEV6FVC-%Pred-Pre: 106 %
FVC-%Pred-Pre: 96 %
FVC-Pre: 2.28 L
Pre FEV1/FVC ratio: 87 %
Pre FEV6/FVC Ratio: 100 %

## 2023-12-22 NOTE — Patient Instructions (Signed)
 Spirometry and diffusion capacity performed today.

## 2023-12-22 NOTE — Progress Notes (Signed)
 Spirometry and diffusion capacity performed today.

## 2023-12-23 ENCOUNTER — Inpatient Hospital Stay: Admission: RE | Admit: 2023-12-23 | Discharge: 2023-12-23

## 2023-12-23 DIAGNOSIS — K8050 Calculus of bile duct without cholangitis or cholecystitis without obstruction: Secondary | ICD-10-CM

## 2023-12-23 DIAGNOSIS — K8020 Calculus of gallbladder without cholecystitis without obstruction: Secondary | ICD-10-CM | POA: Diagnosis not present

## 2023-12-23 MED ORDER — GADOPICLENOL 0.5 MMOL/ML IV SOLN
6.0000 mL | Freq: Once | INTRAVENOUS | Status: AC | PRN
  Administered 2023-12-23: 6 mL via INTRAVENOUS

## 2023-12-24 NOTE — Patient Instructions (Incomplete)
  ILD (interstitial lung disease) (HCC)  - ILD not sure if It was there in 2015  Subtle mild early ILD since 2021 -> 2023 -> stable in May 2025; looks stable.  Plan -Supportive expectant approach with serial monitoring -No antifibrotic or biopsy recommended at this point in time -Do spir and dlco in 6 months   Hx of asthma  -This was started in the fall 2023 because of shortness of breath and you seem to have improvement.  Diagnosis of asthma was clinical and glad you are doing well without the inhaler   plan   Monitor without inhaler   Coroary art calcification on CT  Plan  - refer cardiology   Preop resp exam for potential knee surgery  Low risk for pulmonary complications from knee surgery [place] blood clot prevention needed  Plan  - inform surgeon  Followup  -6 months - 15 min visit; but after  PFT  - walk and ILD symptoms score at followup

## 2023-12-24 NOTE — Progress Notes (Unsigned)
 OV 10/21/2021 to Dr. Geronimo at the ILD center by Dr. Oneil Neth  Subjective:  Patient ID: Melissa Tapia, female , DOB: [birth year] , age 83 y.o. , MRN: [id number] , ADDRESS: [address] [place] [facility] [address] PCP Neth Oneil, MD Patient Care Team: Neth Oneil, MD as PCP - General (Internal Medicine)  This Provider for this visit: Treatment Team:  Attending Provider: Geronimo Amel, MD    10/21/2021 -   Chief Complaint  Patient presents with   Consult    Pt recently had a CT performed which is the reason for today's visit.  Pt states that she does have complaints of SOB that can happen at anytime.     HPI Melissa Tapia 83 y.o. -is a retired runner, broadcasting/film/video.  She works at [employer] in the schools and [employer].  School was built in 1928 but a subsequent building was built in 1980s.  She worked in the old building and then retired at some point.  Her current symptoms are somewhat atypical with the evaluation for ILD.  She says all of a sudden in May 2023 she had phlebitis in the lower extremity and also a Baker's cyst removed but then by the end of July 2023 she had nausea and was treated with Nexium  in the emergency room.  Then by September 5 and 6, 2023 she started feeling significantly different and started having shortness of breath.  By September 11 she was started on Advair September 20 this was changed to [facility] and this is actually helping her symptoms.  During all this she ended up with a high-resolution CT chest that showed ILD and therefore she has been referred here.  According to the radiologist the findings worse compared to 2015.  In my personal visualization she has had the findings at least since 2021 but possibly not in 2015.  It appears stable to me as of September 2023.  She actually does not have any chronic shortness of breath or cough.  She does report new onset paroxysmal nocturnal dyspnea more recently not otherwise specified.  She wakes up with  dyspnea also feels paresthesia in her fingers.  This has not been evaluated she is wondering if this because of anxiety.  She says recently her sister came and slept with then she did not have it.  Her husband died 18 months ago and she is living alone and this makes her a little bit nervous.  [facility] Comprehensive ILD Questionnaire  Symptoms:  x Past Medical History :  -She might have asthma per history but otherwise a lot of past medical history is negative   ROS:  She has arthralgia which she thinks is due to age - She has new onset paroxysmal nocturnal dyspnea. - She has dry mouth not otherwise specified. - After her husband died 18 months ago she is lost 8 pounds. - She has had nausea for the last few weeks this is associated with the paroxysmal nocturnal dyspnea.  FAMILY HISTORY of LUNG DISEASE:  -She thinks her dad had pulmonary fibrosis*  PERSONAL EXPOSURE HISTORY:  -She smoked some in college in 1962 but that about it.  Detailed organic and inorganic antigen exposure history in the house is negative.  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  Old building built in 1928.  A new building was built in the 1980s.  She was exposed to old buildings but does not remember any dampness or musty smell or mold..  Detailed organic and inorganic antigen exposure history is negative  PULMONARY TOXICITY HISTORY (27 items):  -.  This is negative.  INVESTIGATIONS: x   CT Chest data - HRCT 09/30/21   Narrative & Impression  CLINICAL DATA:  Pulmonary fibrosis   EXAM: CT CHEST WITHOUT CONTRAST   TECHNIQUE: Multidetector CT imaging of the chest was performed following the standard protocol without intravenous contrast. High  resolution imaging of the lungs, as well as inspiratory and expiratory imaging, was performed.   RADIATION DOSE REDUCTION: This exam was performed according to the departmental dose-optimization program which includes automated exposure control, adjustment of the mA and/or kV according to patient size and/or use of iterative reconstruction technique.   COMPARISON:  CT chest pulmonary angiogram, 09/24/2021, CT chest coronary calcium  scoring, 09/11/2019, CT abdomen pelvis, 04/24/2013   FINDINGS: Cardiovascular: Aortic atherosclerosis. Normal heart size. Scattered left coronary artery calcifications. No pericardial effusion.   Mediastinum/Nodes: No enlarged mediastinal, hilar, or axillary lymph nodes. Small hiatal hernia. Thyroid  gland, trachea, and esophagus demonstrate no significant findings.   Lungs/Pleura: Mild pulmonary fibrosis in a pattern with apical to basal gradient, featuring irregular peripheral interstitial opacity and ground-glass, as well as traction bronchiectasis at the lung bases, however without clear evidence of subpleural bronchiolectasis or honeycombing. Fibrotic findings appear very slightly worsened over time when compared to the lung bases as included on examination dated 04/24/2013. No significant air trapping on expiratory phase imaging. No pleural effusion or pneumothorax.   Upper Abdomen: No acute abnormality.   Musculoskeletal: No chest wall abnormality. No acute osseous findings.   IMPRESSION: 1. Mild pulmonary fibrosis in a pattern with apical to basal gradient, featuring irregular peripheral interstitial opacity and ground-glass, as well as traction bronchiectasis at the lung bases, however without clear evidence of subpleural bronchiolectasis or honeycombing. Fibrotic findings appear very slightly worsened over time when compared to the lung bases as included on examination dated 04/24/2013. Findings are indeterminate for UIP per  consensus guidelines, differential considerations including both UIP and NSIP: Diagnosis of Idiopathic Pulmonary Fibrosis: An Official ATS/ERS/JRS/ALAT Clinical Practice Guideline. Am JINNY Honey Crit Care Med Vol 198, Iss 5, [id number], Sep 17 2016. Consider pulmonary referral and interstitial lung disease protocol CT follow-up in 1 year if indicated by clinical suspicion for fibrotic interstitial lung disease. 2. Coronary artery disease. 3. Cholelithiasis.   Aortic Atherosclerosis (ICD10-I70.0).     Electronically Signed   By: Marolyn JONETTA Jaksch M.D.   On: 10/01/2021 10:02    No results found.    OV 01/24/2022  Subjective:  Patient ID: Melissa Tapia, female , DOB: Feb 11, 1940 , age 1 y.o. , MRN: [id number] , ADDRESS: [address] [place] [facility] [address] PCP Shayne Anes, MD Patient Care Team: Shayne Anes, MD as PCP - General (Internal Medicine)  This Provider for this visit: Treatment Team:  Attending Provider: Geronimo Amel, MD    01/24/2022 -   Chief Complaint  Patient presents with   Follow-up    PFT, ILD, feeling better from last visit.     HPI Melissa Tapia 83 y.o. -returns for follow-up.  She continues to be asymptomatic from a respiratory standpoint.  She used to have some paroxysmal nocturnal dyspnea but we got an echocardiogram that showed grade 1 diastolic  dysfunction but otherwise normal.  BNP was normal.  She does not have any dyspnea on exertion there is no cough.  She believes the paroxysmal nocturnal dyspnea was because of anxiety.  She still gets up at night but not because of shortness of breath but more because of dry mouth.  She is taking fish oil.  She is taking Breo because of clinical asthma prescriber primary care physician she believes this is helping her.  She full pulmonary function test and this is essentially normal with a normal FVC and DLCO.  She did have overnight desaturation test and she desaturated for 7 minutes which is very mild.  She did  not want to do oxygen  for this.  She has other complaints - She recently has been diagnosed by primary care physician with thrush.  She has tried different treatments.  On my exam today there is no thrush.  She is continuing with those.  She understands Ranell is a risk factor for this but she believes Ranell is helping her.  We talked about doing Biotene and also flaxseed and she is going to do this   - She has not had RSV vaccine.  She discussed with primary care physician was recommended.  I seconded the recommendation very strongly.    Latest Reference Range & Units 10/21/21 15:37  CK Total 29 - 143 U/L 65  CK, MB 0 - 5.0 ng/mL 0.9  Pro B Natriuretic peptide (BNP) 0.0 - 100.0 pg/mL 21.0  Aldolase < OR = 8.1 U/L 4.8  Sed Rate 0 - 30 mm/hr 1  Anti Nuclear Antibody (ANA) NEGATIVE  NEGATIVE  Angiotensin-Converting Enzyme 9 - 67 U/L 20  Cyclic Citrullin Peptide Ab UNITS <16  ds DNA Ab IU/mL <1  RA Latex Turbid. <14 IU/mL <14  SSA (Ro) (ENA) Antibody, IgG <1.0 NEG AI <1.0 NEG  SSB (La) (ENA) Antibody, IgG <1.0 NEG AI <1.0 NEG  Scleroderma (Scl-70) (ENA) Antibody, IgG <1.0 NEG AI <1.0 NEG    ECHO OCt 2023   IMPRESSIONS     1. Left ventricular ejection fraction, by estimation, is 60 to 65%. Left  ventricular ejection fraction by 3D volume is 65 %. The left ventricle has  normal function. The left ventricle has no regional wall motion  abnormalities. Left ventricular diastolic   parameters are consistent with Grade I diastolic dysfunction (impaired  relaxation).   2. Right ventricular systolic function is normal. The right ventricular  size is normal. There is normal pulmonary artery systolic pressure.   3. The mitral valve is normal in structure. No evidence of mitral valve  regurgitation. No evidence of mitral stenosis.   4. The aortic valve is tricuspid. Aortic valve regurgitation is not  visualized. No aortic stenosis is present.   5. The inferior vena cava is normal in size  with greater than 50%  respiratory variability, suggesting right atrial pressure of 3 mmHg.     OV 06/23/2022  Subjective:  Patient ID: Melissa Tapia, female , DOB: November 18, 1940 , age 3 y.o. , MRN: [id number] , ADDRESS: [address] [place] [facility] [address] PCP Shayne Anes, MD Patient Care Team: Shayne Anes, MD as PCP - General (Internal Medicine)  This Provider for this visit: Treatment Team:  Attending Provider: Geronimo Amel, MD    06/23/2022 -   Chief Complaint  Patient presents with   Follow-up    Review PFT today.  Waking during night with left food discomfort.  Breathing - no issues.  Weight gain,  hair loss and finger nail abnormality      HPI Melissa Tapia 83 y.o. -returns for follow-up.  She has mild subclinical ILD for which she is on supportive care and expectant follow-up.  She continues to be on Breo since October 2023 following shortness of breath.  She does not have any shortness of breath or chest pain or wheezing or paroxysmal nocturnal dyspnea orthopnea or cough.  Infection attributes the symptom relief due to Breo.  The diagnose of asthma is clinical.  Is made with the primary care doctor.  She is wondering if she should be on Breo.  We had back-and-forth discussion about this.  I did indicate that she could stop the Breo and see if her symptoms returned.  If it returns we would give her prednisone to quell the symptoms.  Told her to watch for shortness of breath or cough or wheezing or chest tightness or bronchitis.  We took a shared decision making to stop the Breo and monitor her.  In terms of ILD her pulmonary function test is stable.  She continues to be asymptomatic.  Her exercise hypoxemia test today was also normal.  Did indicate to her the best plan of action is for us  to continue to follow closely and intervene with antifibrotic's if there is progression.  Overall I predict good prognosis for her given her normal PFTs and asymptomatic  state.          OV 12/20/2022  Subjective:  Patient ID: Melissa Tapia, female , DOB: July 30, 1940 , age 86 y.o. , MRN: [id number] , ADDRESS: [address] [place] [facility] [address] PCP Shayne Anes, MD Patient Care Team: Shayne Anes, MD as PCP - General (Internal Medicine)  This Provider for this visit: Treatment Team:  Attending Provider: Geronimo Amel, MD      12/20/2022 -   Chief Complaint  Patient presents with   Follow-up    6 month follow up  finger nails are flatted out, discuss oxygen  are going down when sleeping, notice tingle in feet. Discuss hydration levels       HPI Melissa Tapia 83 y.o. -returns for 27-month follow-up.  She is off of Breo and doing well.  Essentially asymptomatic.  She had pulmonary function test and this is still normal.  Her concerns today is that for the last 3 weeks she has had tingling in her feet particularly when she goes to bed.  She has osteoarthritis bone-on-bone on her knee.  And she is wondering if this could be related to ILD.  However rest of pulmonary symptoms are normal and also pulmonary function test is normal.  Therefore I told her this was extremely low odds tingling is got relationship with potential nocturnal hypoxemia.  Therefore she said she will talk to primary care doctor about it.  She is also reporting nasal flattening for the last few months.  She showed me that her nails will become more concave particularly one of her thumb.  Did indicate to her and showed her pictures of clubbing and is the opposite.  Therefore she said she will talk to her primary care doctor.  Clubbing is associated with lung disease and fibrosis but she does not have this.  She is also trying to stay hydrated and she showed some finger wrinkles.  But there is no Raynaud's there is no other skin thickening there is no scleroderma.  And she said she will talk to the primary care doctor.  In addition she had  questions about the COVID mRNA vaccine.  I  did indicate to her that the side effect profile well overall safe mimics the disease and is definitely more than a flu shot or RSV vaccine.  She has had COVID mRNA vaccine before without any problems therefore we took a shared decision making that she will continue to have COVID shot this fall.       OV 06/19/2023  Subjective:  Patient ID: Melissa Tapia, female , DOB: [birth year] , age 64 y.o. , MRN: [id number] , ADDRESS: [address] [place] [facility] [address] PCP Shayne Anes, MD Patient Care Team: Shayne Anes, MD as PCP - General (Internal Medicine) Tobie Tonita POUR, DO as Consulting Physician (Neurology)  This Provider for this visit: Treatment Team:  Attending Provider: Geronimo Amel, MD    06/19/2023 -   Chief Complaint  Patient presents with   Interstitial Lung Disease    HPI Melissa Tapia 83 y.o. -returns for follow-up.  At this point in time she is doing stable is asymptomatic. Interim Health status: No new complaints No new medical problems. No new surgeries. No ER visits. No Urgent care visits. No changes to medications.  Only issue is that her sciatic pain and her left knee are bothering her which.  This is a chronic issue she is contemplating surgery but no decision has been made.  She did have high-resolution CT chest that I personally visualized.  I disagree with the probable UIP description and I agree it is indeterminate.  It is stable since 2023.  She is essentially asymptomatic at this point.  This reports of coronary artery calcification and we discussed this.  There is also report of aortic diameter being 3.8 cm.  Referred to cardiology.  She has never had a stress test but there is no chest pain.     OV 12/25/2023  Subjective:  Patient ID: Melissa Tapia, female , DOB: 1940/10/13 , age 78 y.o. , MRN: [id number] , ADDRESS: [address] [place] [id number] PCP Shayne Anes, MD Patient Care Team: Shayne Anes, MD as PCP - General (Internal  Medicine) Michele Richardson, DO as PCP - Cardiology (Cardiology) Patel, Donika K, DO as Consulting Physician (Neurology)  This Provider for this visit: Treatment Team:  Attending Provider: Geronimo Amel, MD   Early ILD/subclinical ILD -> ILA bper 2025 classification -indeterminate for UIP September 2023 CT scan AND  May 205 -  normal pulmonary function test January 2024 June 2024.and Dec 2025  - lugn fields clear on Cardiac CT Aug 2025 and CT abd Nov 2025  Clinical asthma being treated with Breo by primary care physician.->  Off Breo since summer 2024 and on expectation follow-up.  12/25/2023 -   Chief Complaint  Patient presents with   Interstitial Lung Disease    Pt states since LOV breathing has been good NO SOB NO COUGH     HPI Melissa Tapia 83 y.o. -Melissa Tapia is an 83 year old female who presents for follow-up of mild lung scar tissue.  She has not experienced any recent hospitalizations, surgeries, or emergency room visits. She possesses an inhaler but has not needed to use it. No shortness of breath, and she is able to navigate stairs and walk without difficulty.  She experiences some anxiety, which she attributes to living alone since her husband's passing three years ago. However, she manages to cope with it and does not let it affect her daily life.  A CT scan of the  abdomen around Thanksgiving, performed due to weight loss, revealed gallstones. A cardiac scan in August showed clear lung fields, and a high-resolution scan identified mild scar tissue in the lungs, which was not visible on standard scans.     SYMPTOM SCALE - ILD 10/21/2021 06/23/2022 breo 12/20/2022 Off breo 06/19/2023 and 12/25/2023    Current weight    ra  O2 use 0 0    Shortness of Breath 0 -> 5 scale with 5 being worst (score 6 If unable to do)     At rest 00 0 0 0  Simple tasks - showers, clothes change, eating, shaving 00 0 0 0  Household (dishes, doing bed, laundry) 0 00 0 0  Shopping 00 0  0 00  Walking level at own pace 0 0 0   Walking up Stairs 0 0 0 0  Total (30-36) Dyspnea Score 0 0 0 0      Non-dyspnea symptoms (0-> 5 scale) 10/21/2021 06/23/2022  12/20/2022  06/19/2023  And 12/25/2023   How bad is your cough? 0 0 0 0  How bad is your fatigue 3 0 0 00  How bad is nausea 2 0 0 0  How bad is vomiting?  0 00 0 0  How bad is diarrhea? 0 0 0 0  How bad is anxiety? 4 1 1  0 (2 on 12/25/2023 )  How bad is depression 1 1 0 0  Any chronic pain - if so where and how bad 0 x 0 0      SIT STAND TEST - goal 15 times   06/19/2023  12/25/2023   O2 used ra ra  PRobe - finter or forehead figer finger  Number sit and stand completed - goal 15 15 15   Time taken to complete x 45.4 sec  Resting Pulse Ox/HR/Dyspnea  98% and 83/min and dyspnea of 0/10  98% and HR 77 and score 0  Peak measures 100 % and 110/min and dyspnea of 0/10 99% and HR 111 and score 0  Final Pulse Ox/HR 97% and 77/min and dyspnea of x/10 98% and HR 81 and score 0  Desaturated </= 88% no no  Desaturated <= 3% points no no  Got Tachycardic >/= 90/min yes yes  Miscellaneous comments x    CT Chest data from date: yes*  - personally visualized and independently interpreted : may 2025, aug 2025 and nov 2025 - my findings are: ILA on HRCT, clear on standards scan   PFT     Latest Ref Rng & Units 12/22/2023   12:36 PM 12/20/2022    9:47 AM 06/23/2022    1:32 PM 01/24/2022    2:07 PM  PFT Results  FVC-Pre L 2.28  P 2.26  2.16  2.12   FVC-Predicted Pre % 96  P 94  89  86   Pre FEV1/FVC % % 87  P 89  92  89   FEV1-Pre L 1.98  P 2.01  1.99  1.89   FEV1-Predicted Pre % 114  P 112  111  104   DLCO uncorrected ml/min/mmHg 16.97  P 16.74  19.47  15.70   DLCO UNC% % 94  P 92  107  86   DLCO corrected ml/min/mmHg 16.97  P 16.74  19.47  15.70   DLCO COR %Predicted % 94  P 92  107  86   DLVA Predicted % 106  P 110  118  110   TLC L    4.98  TLC % Predicted %    101   RV % Predicted %    90     P Preliminary  result       LAB RESULTS last 96 hours MR ABDOMEN MRCP W WO CONTAST Result Date: 12/23/2023 EXAM: MRCP WITH AND WITHOUT IV CONTRAST 12/23/2023 12:55:00 PM TECHNIQUE: Multisequence, multiplanar magnetic resonance images of the abdomen with and without intravenous contrast. MRCP sequences were performed. 6.0 cc of Vueway  was administered. COMPARISON: CT abdomen and pelvis from 12/12/2023. CLINICAL HISTORY: FINDINGS: LIVER: Small cyst within the right lobe of the liver measures 5 mm, image 11/6. GALLBLADDER AND BILIARY SYSTEM: Gallstones are identified within the dependent portion of the gallbladder. These measure up to 5 mm. No gallbladder wall thickening or pericholecystic inflammation. There is no intrahepatic bile duct dilatation. The common bile duct is nondilated measuring up to 4 mm. Distal common bile duct stone is suspected, measuring 4 mm. Coronal image 22/5. This corresponds to the CT findings from 12/12/2023. SPLEEN: The spleen is within normal limits in size and appearance. PANCREAS/PANCREATIC DUCT: The pancreas is normal in size and contour without focal lesion or ductal dilatation. ADRENAL GLANDS: There is a nodule within the left adrenal gland measuring 0.9 cm. This shows loss of signal on the diaphyseal sequences compatible with a benign adenoma. No follow-up imaging recommended. KIDNEYS: Bilateral Bosniak class 1 and 2 kidney cysts are identified, the largest arises off the posterolateral cortex of the right kidney, measuring 1.3 cm. Image 17/10. No follow-up imaging recommended for bosniak class 1 and 2 kidney cysts recommended unless otherwise clinically indicated. LYMPH NODES: No enlarged abdominal lymph nodes. VASCULATURE: Unremarkable. PERITONEUM: No ascites. ABDOMINAL WALL: No hernia. No mass. BOWEL: Grossly unremarkable. No bowel obstruction. BONES: The lower thoracic and lumbar spine is convex towards the right. No acute abnormality or worrisome osseous lesion. SOFT TISSUES:  Unremarkable. MISCELLANEOUS: Unremarkable. IMPRESSION: 1. Gallstones up to 5 mm without evidence of cholecystitis. 2. 4 mm distal common bile duct stone corresponding to prior CT, with no intrahepatic bile duct dilatation. Electronically signed by: Waddell Calk MD 12/23/2023 01:29 PM EST RP Workstation: [id number]         has a past medical history of Hypercholesteremia and Hypertension.   reports that she has quit smoking. Her smoking use included cigarettes. She has been exposed to tobacco smoke. She has never used smokeless tobacco.  History reviewed. No pertinent surgical history.  Allergies  Allergen Reactions   Aspirin Nausea Only   Sulfa Antibiotics Rash    In 1963    Immunization History  Administered Date(s) Administered   Fluad Quad(high Dose 65+) 10/21/2021   Influenza-Unspecified 10/31/2020, 11/16/2023   PFIZER(Purple Top)SARS-COV-2 Vaccination 02/11/2019, 03/04/2019   Pfizer Covid-19 Vaccine Bivalent Booster 89yrs & up 10/13/2020   Pneumococcal-Unspecified 10/31/2020   RSV,unspecified 01/31/2022    Family History  Problem Relation Age of Onset   Leukemia Mother    Hypertension Mother    Colon cancer Father      Current Outpatient Medications:    atorvastatin  (LIPITOR) 20 MG tablet, Take 1 tablet (20 mg total) by mouth at bedtime., Disp: 90 tablet, Rfl: 3   Calcium -Vitamin D (CALTRATE 600 PLUS-VIT D PO), Caltrate 600 plus D, Disp: , Rfl:    Cholecalciferol 25 MCG (1000 UT) capsule, Take by mouth., Disp: , Rfl:    Cyanocobalamin  (B-12 COMPLIANCE INJECTION IJ), Inject as directed. Inject every 3 months, Disp: , Rfl:    cyanocobalamin  (VITAMIN B12) 1000 MCG tablet, Take 1 tablet  by mouth daily., Disp: , Rfl:    denosumab  (PROLIA ) 60 MG/ML SOSY injection, first was 8/20 Subcutaneous, Disp: , Rfl:    docusate sodium (COLACE) 50 MG capsule, Take 50 mg by mouth 2 (two) times daily., Disp: , Rfl:    estradiol (ESTRACE) 0.01 % CREA vaginal cream, Place 0.5 g  vaginally., Disp: , Rfl:    Multiple Vitamins-Minerals (CENTRUM SILVER 50+WOMEN) TABS, Centrum, Disp: , Rfl:    Omega-3 Fatty Acids (FISH OIL) 1200 MG CAPS, , Disp: , Rfl:    telmisartan (MICARDIS) 20 MG tablet, 1/2 TABLET ORALLY ONCE A DAY IN THE EVENING 90 DAYS Orally Once a day for 90 days, Disp: , Rfl:    fluticasone  furoate-vilanterol (BREO ELLIPTA ) 100-25 MCG/ACT AEPB, Inhale 1 puff into the lungs daily. (Patient not taking: Reported on 12/25/2023), Disp: 60 each, Rfl: 6      Objective:   Vitals:   12/25/23 1517  BP: 126/72  Pulse: 75  Temp: 98.2 F (36.8 C)  TempSrc: Oral  SpO2: 98%  Weight: 141 lb 12.8 oz (64.3 kg)  Height: 5' 3 (1.6 m)    Estimated body mass index is 25.12 kg/m as calculated from the following:   Height as of this encounter: 5' 3 (1.6 m).   Weight as of this encounter: 141 lb 12.8 oz (64.3 kg).  @WEIGHTCHANGE @  [employer]   12/25/23 1517  Weight: 141 lb 12.8 oz (64.3 kg)     Physical Exam   General: No distress. Looks well O2 at rest: no Cane present: no Sitting in wheel chair: no Frail: no Obese: no Neuro: Alert and Oriented x 3. GCS 15. Speech normal Psych: Pleasant Resp:  Barrel Chest - no.  Wheeze - no, Crackles - no, No overt respiratory distress CVS: Normal heart sounds. Murmurs - no Ext: Stigmata of Connective Tissue Disease - no HEENT: Normal upper airway. PEERL +. No post nasal drip        Assessment/     Assessment & Plan Interstitial lung abnormality (ILA)  History of asthma    PLAN Patient Instructions   ILA   - On HRCT it is  Subtle mild early ILD since 2021 -> 2023 -> stable in May 2025;  - Not described on CT abd lung image Nov 2025 and [facility] CT aug 2025 - Overall this is called ILA -  - Abnormality and not disease -- Overall stable over time   Plan -Supportive expectant approach with serial monitoring -No antifibrotic or biopsy recommended at this point in time -Do spir and dlco in 12  months   Hx of asthma  -This was started in the fall 2023 because of shortness of breath and you seem to have improvement.  Diagnosis of asthma was clinical and glad you are doing well without the inhaler   plan   Monitor without inhaler Followup  -`12 months - 15 min visit; but after  PFT  - walk and ILD symptoms score at followup    FOLLOWUP    Return for  -`12 months - 15 min visit; but after  PFT.    SIGNATURE    Dr. Dorethia Cave, M.D., F.C.C.P,  Pulmonary and Critical Care Medicine Staff Physician, [facility] Center Director - Interstitial Lung Disease  Program  Pulmonary Fibrosis [facility] at [facility] [place], [place], [address]  Pager: [phone], If no answer or between  15:00h - 7:00h: call [phone] Telephone: [phone]  3:45 PM 12/25/2023

## 2023-12-25 ENCOUNTER — Encounter: Payer: Self-pay | Admitting: Internal Medicine

## 2023-12-25 ENCOUNTER — Ambulatory Visit: Admitting: Internal Medicine

## 2023-12-25 VITALS — BP 126/72 | HR 75 | Temp 98.2°F | Ht 63.0 in | Wt 141.8 lb

## 2023-12-25 DIAGNOSIS — R918 Other nonspecific abnormal finding of lung field: Secondary | ICD-10-CM | POA: Diagnosis not present

## 2023-12-25 DIAGNOSIS — I2510 Atherosclerotic heart disease of native coronary artery without angina pectoris: Secondary | ICD-10-CM

## 2023-12-25 DIAGNOSIS — J849 Interstitial pulmonary disease, unspecified: Secondary | ICD-10-CM

## 2023-12-25 DIAGNOSIS — Z8709 Personal history of other diseases of the respiratory system: Secondary | ICD-10-CM

## 2023-12-26 ENCOUNTER — Other Ambulatory Visit: Payer: Self-pay | Admitting: Gastroenterology

## 2023-12-27 DIAGNOSIS — K8020 Calculus of gallbladder without cholecystitis without obstruction: Secondary | ICD-10-CM | POA: Diagnosis not present

## 2023-12-29 ENCOUNTER — Ambulatory Visit (HOSPITAL_COMMUNITY): Admitting: Anesthesiology

## 2023-12-29 ENCOUNTER — Ambulatory Visit (HOSPITAL_COMMUNITY)

## 2023-12-29 ENCOUNTER — Other Ambulatory Visit: Payer: Self-pay

## 2023-12-29 ENCOUNTER — Encounter (HOSPITAL_COMMUNITY): Payer: Self-pay | Admitting: Gastroenterology

## 2023-12-29 DIAGNOSIS — I77819 Aortic ectasia, unspecified site: Secondary | ICD-10-CM | POA: Insufficient documentation

## 2023-12-29 DIAGNOSIS — J8410 Pulmonary fibrosis, unspecified: Secondary | ICD-10-CM | POA: Insufficient documentation

## 2023-12-29 DIAGNOSIS — K838 Other specified diseases of biliary tract: Secondary | ICD-10-CM | POA: Diagnosis not present

## 2023-12-29 DIAGNOSIS — M1990 Unspecified osteoarthritis, unspecified site: Secondary | ICD-10-CM | POA: Insufficient documentation

## 2023-12-29 DIAGNOSIS — K8020 Calculus of gallbladder without cholecystitis without obstruction: Secondary | ICD-10-CM | POA: Diagnosis not present

## 2023-12-29 DIAGNOSIS — K8050 Calculus of bile duct without cholangitis or cholecystitis without obstruction: Secondary | ICD-10-CM | POA: Diagnosis not present

## 2023-12-29 HISTORY — PX: STONE EXTRACTION WITH BASKET: SHX5318

## 2023-12-29 HISTORY — PX: ERCP: SHX5425

## 2023-12-29 HISTORY — PX: SPHINCTEROTOMY: SHX5279

## 2023-12-29 LAB — POCT I-STAT, CHEM 8
BUN: 24 mg/dL — ABNORMAL HIGH (ref 8–23)
Calcium, Ion: 1.22 mmol/L (ref 1.15–1.40)
Chloride: 103 mmol/L (ref 98–111)
Creatinine, Ser: 1.1 mg/dL — ABNORMAL HIGH (ref 0.44–1.00)
Glucose, Bld: 101 mg/dL — ABNORMAL HIGH (ref 70–99)
HCT: 42 % (ref 36.0–46.0)
Hemoglobin: 14.3 g/dL (ref 12.0–15.0)
Potassium: 4.2 mmol/L (ref 3.5–5.1)
Sodium: 141 mmol/L (ref 135–145)
TCO2: 26 mmol/L (ref 22–32)

## 2023-12-29 SURGERY — ERCP, WITH INTERVENTION IF INDICATED
Anesthesia: General

## 2023-12-29 MED ORDER — GLUCAGON HCL RDNA (DIAGNOSTIC) 1 MG IJ SOLR
INTRAMUSCULAR | Status: AC
  Filled 2023-12-29: qty 1

## 2023-12-29 MED ORDER — ONDANSETRON HCL 4 MG/2ML IJ SOLN
INTRAMUSCULAR | Status: DC | PRN
  Administered 2023-12-29: 4 mg via INTRAVENOUS

## 2023-12-29 MED ORDER — CIPROFLOXACIN IN D5W 400 MG/200ML IV SOLN
INTRAVENOUS | Status: DC | PRN
  Administered 2023-12-29: 400 mg via INTRAVENOUS

## 2023-12-29 MED ORDER — DICLOFENAC SUPPOSITORY 100 MG
RECTAL | Status: AC
  Filled 2023-12-29: qty 1

## 2023-12-29 MED ORDER — FENTANYL CITRATE (PF) 100 MCG/2ML IJ SOLN
INTRAMUSCULAR | Status: AC
  Filled 2023-12-29: qty 2

## 2023-12-29 MED ORDER — LACTATED RINGERS IV SOLN
INTRAVENOUS | Status: AC | PRN
  Administered 2023-12-29: 1000 mL via INTRAVENOUS

## 2023-12-29 MED ORDER — SODIUM CHLORIDE 0.9 % IV SOLN
INTRAVENOUS | Status: DC | PRN
  Administered 2023-12-29: 20 mL

## 2023-12-29 MED ORDER — CIPROFLOXACIN IN D5W 400 MG/200ML IV SOLN
INTRAVENOUS | Status: AC
  Filled 2023-12-29: qty 200

## 2023-12-29 MED ORDER — DEXAMETHASONE SOD PHOSPHATE PF 10 MG/ML IJ SOLN
INTRAMUSCULAR | Status: DC | PRN
  Administered 2023-12-29: 10 mg via INTRAVENOUS

## 2023-12-29 MED ORDER — PROPOFOL 10 MG/ML IV BOLUS
INTRAVENOUS | Status: DC | PRN
  Administered 2023-12-29: 70 mg via INTRAVENOUS

## 2023-12-29 MED ORDER — SODIUM CHLORIDE 0.9 % IV SOLN: INTRAVENOUS | Status: DC

## 2023-12-29 MED ORDER — LIDOCAINE 2% (20 MG/ML) 5 ML SYRINGE
INTRAMUSCULAR | Status: DC | PRN
  Administered 2023-12-29: 100 mg via INTRAVENOUS

## 2023-12-29 MED ORDER — FENTANYL CITRATE (PF) 250 MCG/5ML IJ SOLN
INTRAMUSCULAR | Status: DC | PRN
  Administered 2023-12-29: 50 ug via INTRAVENOUS

## 2023-12-29 MED ORDER — ROCURONIUM BROMIDE 10 MG/ML (PF) SYRINGE
PREFILLED_SYRINGE | INTRAVENOUS | Status: DC | PRN
  Administered 2023-12-29: 40 mg via INTRAVENOUS

## 2023-12-29 MED ORDER — PHENYLEPHRINE 80 MCG/ML (10ML) SYRINGE FOR IV PUSH (FOR BLOOD PRESSURE SUPPORT)
PREFILLED_SYRINGE | INTRAVENOUS | Status: DC | PRN
  Administered 2023-12-29: 80 ug via INTRAVENOUS
  Administered 2023-12-29: 120 ug via INTRAVENOUS

## 2023-12-29 MED ORDER — SUGAMMADEX SODIUM 200 MG/2ML IV SOLN
INTRAVENOUS | Status: DC | PRN
  Administered 2023-12-29: 200 mg via INTRAVENOUS

## 2023-12-29 NOTE — Progress Notes (Signed)
 Melissa Tapia 11:44 AM  Subjective: Patient doing well without any problems since she was seen recently in the office and we rediscussed the procedure and answered all of her questions  Objective: Vital signs stable afebrile no acute distress exam please see preassessment evaluation  Assessment: CBD stones  Plan: Okay to proceed with ERCP with anesthesia assistance  [facility]  office [phone] After 5PM or if no answer call [phone]

## 2023-12-29 NOTE — Anesthesia Preprocedure Evaluation (Addendum)
 Anesthesia Evaluation  Patient identified by MRN, date of birth, ID band Patient awake    Reviewed: Allergy & Precautions, NPO status , Patient's Chart, lab work & pertinent test results, reviewed documented beta blocker date and time   History of Anesthesia Complications Negative for: history of anesthetic complications  Airway Mallampati: II  TM Distance: >3 FB     Dental no notable dental hx.    Pulmonary asthma , neg COPD, former smoker Mild pulmonary fibrosis   breath sounds clear to auscultation       Cardiovascular hypertension, (-) CAD, (-) Past MI, (-) Cardiac Stents and (-) CABG  Rhythm:Regular Rate:Normal  IMPRESSIONS     1. Left ventricular ejection fraction, by estimation, is 60 to 65%. Left  ventricular ejection fraction by 3D volume is 66 %. The left ventricle has  normal function. The left ventricle has no regional wall motion  abnormalities. Left ventricular diastolic   parameters are consistent with Grade I diastolic dysfunction (impaired  relaxation).   2. Right ventricular systolic function is normal. The right ventricular  size is normal.   3. The mitral valve is normal in structure. Trivial mitral valve  regurgitation. No evidence of mitral stenosis.   4. The aortic valve is normal in structure. Aortic valve regurgitation is  trivial. No aortic stenosis is present.   5. Aortic dilatation noted. There is mild dilatation of the ascending  aorta, measuring 42 mm.   6. The inferior vena cava is normal in size with greater than 50%  respiratory variability, suggesting right atrial pressure of 3 mmHg.      Neuro/Psych neg Seizures    GI/Hepatic ,,,(+) neg Cirrhosis        Endo/Other    Renal/GU Renal disease     Musculoskeletal  (+) Arthritis ,    Abdominal   Peds  Hematology   Anesthesia Other Findings   Reproductive/Obstetrics                               Anesthesia Physical Anesthesia Plan  ASA: 2  Anesthesia Plan: General   Post-op Pain Management:    Induction: Intravenous  PONV Risk Score and Plan: Ondansetron , Dexamethasone and Propofol infusion  Airway Management Planned: Oral ETT  Additional Equipment:   Intra-op Plan:   Post-operative Plan:   Informed Consent: I have reviewed the patients History and Physical, chart, labs and discussed the procedure including the risks, benefits and alternatives for the proposed anesthesia with the patient or authorized representative who has indicated his/her understanding and acceptance.     Dental advisory given  Plan Discussed with: CRNA  Anesthesia Plan Comments:          Anesthesia Quick Evaluation

## 2023-12-29 NOTE — Transfer of Care (Signed)
 Immediate Anesthesia Transfer of Care Note  Patient: Melissa Tapia  Procedure(s) Performed: ERCP, WITH INTERVENTION IF INDICATED SPHINCTEROTOMY, BILIARY ERCP, WITH LITHROTRIPSY OR REMOVAL OF COMMON BILE DUCT CALCULUS USING BALLOON  Patient Location: PACU  Anesthesia Type:General  Level of Consciousness: drowsy  Airway & Oxygen  Therapy: Patient Spontanous Breathing  Post-op Assessment: Report given to RN  Post vital signs: Reviewed and stable  Last Vitals:  Vitals Value Taken Time  BP 102/49 12/29/23 12:51  Temp    Pulse 66 12/29/23 12:53  Resp 13 12/29/23 12:53  SpO2 93 % 12/29/23 12:53  Vitals shown include unfiled device data.  Last Pain:  Vitals:   12/29/23 1021  TempSrc: Oral  PainSc: 0-No pain      Patients Stated Pain Goal: 0 (12/29/23 1021)  Complications: No notable events documented.

## 2023-12-29 NOTE — Anesthesia Postprocedure Evaluation (Signed)
 Anesthesia Post Note  Patient: Melissa Tapia  Procedure(s) Performed: ERCP, WITH INTERVENTION IF INDICATED SPHINCTEROTOMY, BILIARY ERCP, WITH LITHROTRIPSY OR REMOVAL OF COMMON BILE DUCT CALCULUS USING BALLOON     Patient location during evaluation: PACU Anesthesia Type: General Level of consciousness: awake and alert Pain management: pain level controlled Vital Signs Assessment: post-procedure vital signs reviewed and stable Respiratory status: spontaneous breathing, nonlabored ventilation, respiratory function stable and patient connected to nasal cannula oxygen  Cardiovascular status: blood pressure returned to baseline and stable Postop Assessment: no apparent nausea or vomiting Anesthetic complications: no   No notable events documented.  Last Vitals:  Vitals:   12/29/23 1320 12/29/23 1325  BP: (!) 110/57   Pulse: 63 66  Resp: 14 18  Temp:    SpO2: 95% 100%    Last Pain:  Vitals:   12/29/23 1305  TempSrc:   PainSc: 0-No pain                 Lynwood MARLA Cornea

## 2023-12-29 NOTE — Anesthesia Procedure Notes (Signed)
 Procedure Name: Intubation Date/Time: 12/29/2023 11:53 AM  Performed by: Delores Duwaine SAUNDERS, CRNAPre-anesthesia Checklist: Patient identified, Emergency Drugs available, Suction available and Patient being monitored Patient Re-evaluated:Patient Re-evaluated prior to induction Oxygen  Delivery Method: Circle System Utilized Preoxygenation: Pre-oxygenation with 100% oxygen  Induction Type: IV induction Ventilation: Mask ventilation without difficulty Laryngoscope Size: Mac and 3 Grade View: Grade I Tube type: Oral Tube size: 7.0 mm Number of attempts: 1 Airway Equipment and Method: Stylet and Oral airway Placement Confirmation: ETT inserted through vocal cords under direct vision, positive ETCO2 and breath sounds checked- equal and bilateral Secured at: 21 cm Tube secured with: Tape Dental Injury: Teeth and Oropharynx as per pre-operative assessment

## 2023-12-29 NOTE — Op Note (Signed)
 [facility] Patient Name: Melissa Tapia Procedure Date : 12/29/2023 MRN: [id number] Attending MD: Oliva Boots , MD, [id number] Date of Birth: 1940-12-31 CSN: [id number] Age: 83 Admit Type: Inpatient Procedure:                ERCP Indications:              Bile duct stone(s) Providers:                Oliva Boots, MD, Ozell Pouch, Haskel Chris,                            Technician Referring MD:              Medicines:                General Anesthesia Complications:            No immediate complications. Estimated Blood Loss:     Estimated blood loss was minimal. Procedure:                Pre-Anesthesia Assessment:                           - Prior to the procedure, a History and Physical                            was performed, and patient medications and                            allergies were reviewed. The patient's tolerance of                            previous anesthesia was also reviewed. The risks                            and benefits of the procedure and the sedation                            options and risks were discussed with the patient.                            All questions were answered, and informed consent                            was obtained. Prior Anticoagulants: The patient has                            taken no anticoagulant or antiplatelet agents. ASA                            Grade Assessment: III - A patient with severe                            systemic disease. After reviewing the risks and                            benefits,  the patient was deemed in satisfactory                            condition to undergo the procedure.                           After obtaining informed consent, the scope was                            passed under direct vision. Throughout the                            procedure, the patient's blood pressure, pulse, and                            oxygen  saturations were monitored continuously. The                             [id number]-Q190L ([id number] Olympus duodenoscope was                            introduced through the mouth, and used to inject                            contrast into and used to cannulate the bile duct.                            The ERCP was accomplished without difficulty. The                            patient tolerated the procedure well. Scope In: Scope Out: Findings:      The major papilla was normal. Initially the wire may have gone in a       false track but it did not go towards the pancreas so we proceeded with       A biliary pre-cut sphincterotomy was made with a Hydratome       sphincterotome using ERBE electrocautery. There was no       post-sphincterotomy bleeding. This precut was very small and then deep       selective cannulation was readily obtained and an obvious stone was seen       on initial cholangiogram and we proceeded with a biliary sphincterotomy       was made with a Jagtome sphincterotome using ERBE electrocautery. There       was self limited oozing from the sphincterotomy which did not require       treatment. We could get the fully bowed sphincterotome easily in and out       of the duct and to discover objects, the biliary tree was swept with an       adjustable 9- 12 mm balloon starting at the bifurcation. All stones were       removed. Both size balloons passed readily through the patent       sphincterotomy site and on subsequent balloon pull-through's nothing was       found. And there was no residual filling defect on occlusion  cholangiogram and there was adequate biliary drainage and there was no       pancreatic duct injection or wire advancement throughout the procedure       and the wire and the balloon and the scope were removed and the patient       tolerated the procedure well there was no obvious immediate complication Impression:               - The major papilla appeared normal albeit                             initially hidden with an overlying fold.                           - Choledocholithiasis was found. Complete removal                            was accomplished by biliary sphincterotomy and                            balloon extraction.                           - A small precut biliary sphincterotomy was                            performed.                           - A biliary sphincterotomy was performed.                           - The biliary tree was swept and nothing was found                            at the end of the procedure. Recommendation:           - Clear liquid diet for 6 hours. If doing well at 6                            PM may have soft solids                           - Continue present medications.                           - Return to GI clinic in 4 weeks.                           - Telephone GI clinic if symptomatic PRN. Procedure Code(s):        --- Professional ---                           [phone], Endoscopic retrograde                            cholangiopancreatography (ERCP); with removal of  calculi/debris from biliary/pancreatic duct(s)                           [phone], Endoscopic retrograde                            cholangiopancreatography (ERCP); with                            sphincterotomy/papillotomy Diagnosis Code(s):        --- Professional ---                           K80.50, Calculus of bile duct without cholangitis                            or cholecystitis without obstruction CPT copyright 2022 [employer] Association. All rights reserved. The codes documented in this report are preliminary and upon coder review may  be revised to meet current compliance requirements. Oliva Boots, MD 12/29/2023 12:46:09 PM This report has been signed electronically. Number of Addenda: 0

## 2023-12-29 NOTE — Discharge Instructions (Addendum)
 Clear liquid diet until 6 PM and if doing well at that time may have soft solids and if doing well tomorrow may slowly advance diet and call me if GI question or problem otherwise follow-up in early January to recheck symptoms and decide if surgical consultation for cholecystectomy is warrantedYOU HAD AN ENDOSCOPIC PROCEDURE TODAY: Refer to the procedure report and other information in the discharge instructions given to you for any specific questions about what was found during the examination. If this information does not answer your questions, please call [facility] office at [phone] to clarify.   YOU SHOULD EXPECT: Some feelings of bloating in the abdomen. Passage of more gas than usual. Walking can help get rid of the air that was put into your GI tract during the procedure and reduce the bloating. If you had a lower endoscopy (such as a colonoscopy or flexible sigmoidoscopy) you may notice spotting of blood in your stool or on the toilet paper. Some abdominal soreness may be present for a day or two, also.  DIET: Your first meal following the procedure should be a light meal and then it is ok to progress to your normal diet. A half-sandwich or bowl of soup is an example of a good first meal. Heavy or fried foods are harder to digest and may make you feel nauseous or bloated. Drink plenty of fluids but you should avoid alcoholic beverages for 24 hours. If you had a esophageal dilation, please see attached instructions for diet.    ACTIVITY: Your care partner should take you home directly after the procedure. You should plan to take it easy, moving slowly for the rest of the day. You can resume normal activity the day after the procedure however YOU SHOULD NOT DRIVE, use power tools, machinery or perform tasks that involve climbing or major physical exertion for 24 hours (because of the sedation medicines used during the test).   SYMPTOMS TO REPORT IMMEDIATELY: A gastroenterologist can be reached at  any hour. Please call [phone]  for any of the following symptoms:  Following lower endoscopy (colonoscopy, flexible sigmoidoscopy) Excessive amounts of blood in the stool  Significant tenderness, worsening of abdominal pains  Swelling of the abdomen that is new, acute  Fever of 100 or higher  Following upper endoscopy (EGD, EUS, ERCP, esophageal dilation) Vomiting of blood or coffee ground material  New, significant abdominal pain  New, significant chest pain or pain under the shoulder blades  Painful or persistently difficult swallowing  New shortness of breath  Black, tarry-looking or red, bloody stools  FOLLOW UP:  If any biopsies were taken you will be contacted by phone or by letter within the next 1-3 weeks. Call [phone]  if you have not heard about the biopsies in 3 weeks.  Please also call with any specific questions about appointments or follow up tests.

## 2023-12-30 ENCOUNTER — Other Ambulatory Visit: Payer: Self-pay

## 2023-12-30 ENCOUNTER — Inpatient Hospital Stay (HOSPITAL_COMMUNITY)

## 2023-12-30 ENCOUNTER — Encounter (HOSPITAL_COMMUNITY): Payer: Self-pay

## 2023-12-30 ENCOUNTER — Inpatient Hospital Stay (HOSPITAL_COMMUNITY)
Admission: EM | Admit: 2023-12-30 | Discharge: 2024-01-06 | DRG: 919 | Disposition: A | Discharge status: Home / Self Care | Attending: Internal Medicine | Admitting: Internal Medicine

## 2023-12-30 ENCOUNTER — Emergency Department (HOSPITAL_COMMUNITY)

## 2023-12-30 DIAGNOSIS — K8020 Calculus of gallbladder without cholecystitis without obstruction: Secondary | ICD-10-CM | POA: Diagnosis not present

## 2023-12-30 DIAGNOSIS — R748 Abnormal levels of other serum enzymes: Secondary | ICD-10-CM | POA: Diagnosis not present

## 2023-12-30 DIAGNOSIS — K9171 Accidental puncture and laceration of a digestive system organ or structure during a digestive system procedure: Secondary | ICD-10-CM | POA: Diagnosis present

## 2023-12-30 DIAGNOSIS — M810 Age-related osteoporosis without current pathological fracture: Secondary | ICD-10-CM | POA: Diagnosis present

## 2023-12-30 DIAGNOSIS — K8050 Calculus of bile duct without cholangitis or cholecystitis without obstruction: Secondary | ICD-10-CM | POA: Diagnosis not present

## 2023-12-30 DIAGNOSIS — Z806 Family history of leukemia: Secondary | ICD-10-CM | POA: Diagnosis not present

## 2023-12-30 DIAGNOSIS — K5720 Diverticulitis of large intestine with perforation and abscess without bleeding: Secondary | ICD-10-CM | POA: Diagnosis not present

## 2023-12-30 DIAGNOSIS — K8070 Calculus of gallbladder and bile duct without cholecystitis without obstruction: Secondary | ICD-10-CM | POA: Diagnosis present

## 2023-12-30 DIAGNOSIS — Z8249 Family history of ischemic heart disease and other diseases of the circulatory system: Secondary | ICD-10-CM | POA: Diagnosis not present

## 2023-12-30 DIAGNOSIS — R739 Hyperglycemia, unspecified: Secondary | ICD-10-CM | POA: Diagnosis present

## 2023-12-30 DIAGNOSIS — E876 Hypokalemia: Secondary | ICD-10-CM | POA: Diagnosis present

## 2023-12-30 DIAGNOSIS — R1030 Lower abdominal pain, unspecified: Secondary | ICD-10-CM

## 2023-12-30 DIAGNOSIS — K659 Peritonitis, unspecified: Secondary | ICD-10-CM | POA: Diagnosis not present

## 2023-12-30 DIAGNOSIS — K265 Chronic or unspecified duodenal ulcer with perforation: Principal | ICD-10-CM | POA: Diagnosis present

## 2023-12-30 DIAGNOSIS — E785 Hyperlipidemia, unspecified: Secondary | ICD-10-CM | POA: Diagnosis present

## 2023-12-30 DIAGNOSIS — R932 Abnormal findings on diagnostic imaging of liver and biliary tract: Secondary | ICD-10-CM | POA: Diagnosis not present

## 2023-12-30 DIAGNOSIS — I700 Atherosclerosis of aorta: Secondary | ICD-10-CM | POA: Diagnosis present

## 2023-12-30 DIAGNOSIS — K859 Acute pancreatitis without necrosis or infection, unspecified: Secondary | ICD-10-CM | POA: Diagnosis not present

## 2023-12-30 DIAGNOSIS — K858 Other acute pancreatitis without necrosis or infection: Secondary | ICD-10-CM

## 2023-12-30 DIAGNOSIS — Z800 Family history of malignant neoplasm of digestive organs: Secondary | ICD-10-CM | POA: Diagnosis not present

## 2023-12-30 DIAGNOSIS — Z79899 Other long term (current) drug therapy: Secondary | ICD-10-CM | POA: Diagnosis not present

## 2023-12-30 DIAGNOSIS — N281 Cyst of kidney, acquired: Secondary | ICD-10-CM | POA: Diagnosis not present

## 2023-12-30 DIAGNOSIS — K5700 Diverticulitis of small intestine with perforation and abscess without bleeding: Principal | ICD-10-CM | POA: Diagnosis present

## 2023-12-30 DIAGNOSIS — R1084 Generalized abdominal pain: Secondary | ICD-10-CM | POA: Diagnosis not present

## 2023-12-30 DIAGNOSIS — N200 Calculus of kidney: Secondary | ICD-10-CM | POA: Diagnosis not present

## 2023-12-30 DIAGNOSIS — R9431 Abnormal electrocardiogram [ECG] [EKG]: Secondary | ICD-10-CM | POA: Diagnosis not present

## 2023-12-30 DIAGNOSIS — K219 Gastro-esophageal reflux disease without esophagitis: Secondary | ICD-10-CM | POA: Diagnosis present

## 2023-12-30 DIAGNOSIS — Z87891 Personal history of nicotine dependence: Secondary | ICD-10-CM | POA: Diagnosis not present

## 2023-12-30 DIAGNOSIS — Z0389 Encounter for observation for other suspected diseases and conditions ruled out: Secondary | ICD-10-CM | POA: Diagnosis not present

## 2023-12-30 DIAGNOSIS — K5730 Diverticulosis of large intestine without perforation or abscess without bleeding: Secondary | ICD-10-CM | POA: Diagnosis not present

## 2023-12-30 DIAGNOSIS — I10 Essential (primary) hypertension: Secondary | ICD-10-CM | POA: Diagnosis not present

## 2023-12-30 DIAGNOSIS — E039 Hypothyroidism, unspecified: Secondary | ICD-10-CM | POA: Diagnosis present

## 2023-12-30 DIAGNOSIS — Z882 Allergy status to sulfonamides status: Secondary | ICD-10-CM | POA: Diagnosis not present

## 2023-12-30 DIAGNOSIS — Z886 Allergy status to analgesic agent status: Secondary | ICD-10-CM

## 2023-12-30 DIAGNOSIS — J189 Pneumonia, unspecified organism: Secondary | ICD-10-CM | POA: Diagnosis present

## 2023-12-30 DIAGNOSIS — K631 Perforation of intestine (nontraumatic): Secondary | ICD-10-CM | POA: Diagnosis not present

## 2023-12-30 DIAGNOSIS — E7800 Pure hypercholesterolemia, unspecified: Secondary | ICD-10-CM | POA: Diagnosis present

## 2023-12-30 LAB — URINALYSIS, ROUTINE W REFLEX MICROSCOPIC
Bilirubin Urine: NEGATIVE
Glucose, UA: NEGATIVE mg/dL
Hgb urine dipstick: NEGATIVE
Ketones, ur: NEGATIVE mg/dL
Leukocytes,Ua: NEGATIVE
Nitrite: NEGATIVE
Protein, ur: NEGATIVE mg/dL
Specific Gravity, Urine: 1.01 (ref 1.005–1.030)
pH: 6 (ref 5.0–8.0)

## 2023-12-30 LAB — CBC WITH DIFFERENTIAL/PLATELET
Abs Immature Granulocytes: 0.06 K/uL (ref 0.00–0.07)
Basophils Absolute: 0 K/uL (ref 0.0–0.1)
Basophils Relative: 0 %
Eosinophils Absolute: 0 K/uL (ref 0.0–0.5)
Eosinophils Relative: 0 %
HCT: 40 % (ref 36.0–46.0)
Hemoglobin: 13.8 g/dL (ref 12.0–15.0)
Immature Granulocytes: 0 %
Lymphocytes Relative: 11 %
Lymphs Abs: 1.4 K/uL (ref 0.7–4.0)
MCH: 34.5 pg — ABNORMAL HIGH (ref 26.0–34.0)
MCHC: 34.5 g/dL (ref 30.0–36.0)
MCV: 100 fL (ref 80.0–100.0)
Monocytes Absolute: 0.9 K/uL (ref 0.1–1.0)
Monocytes Relative: 6 %
Neutro Abs: 11.2 K/uL — ABNORMAL HIGH (ref 1.7–7.7)
Neutrophils Relative %: 83 %
Platelets: 250 K/uL (ref 150–400)
RBC: 4 MIL/uL (ref 3.87–5.11)
RDW: 11.7 % (ref 11.5–15.5)
WBC: 13.5 K/uL — ABNORMAL HIGH (ref 4.0–10.5)
nRBC: 0 % (ref 0.0–0.2)

## 2023-12-30 LAB — COMPREHENSIVE METABOLIC PANEL WITH GFR
ALT: 37 U/L (ref 0–44)
AST: 40 U/L (ref 15–41)
Albumin: 4.3 g/dL (ref 3.5–5.0)
Alkaline Phosphatase: 63 U/L (ref 38–126)
Anion gap: 11 (ref 5–15)
BUN: 18 mg/dL (ref 8–23)
CO2: 25 mmol/L (ref 22–32)
Calcium: 9 mg/dL (ref 8.9–10.3)
Chloride: 99 mmol/L (ref 98–111)
Creatinine, Ser: 0.92 mg/dL (ref 0.44–1.00)
GFR, Estimated: >60 mL/min (ref >60)
Glucose, Bld: 145 mg/dL — ABNORMAL HIGH (ref 70–99)
Potassium: 3.8 mmol/L (ref 3.5–5.1)
Sodium: 135 mmol/L (ref 135–145)
Total Bilirubin: 1.9 mg/dL — ABNORMAL HIGH (ref 0.0–1.2)
Total Protein: 7 g/dL (ref 6.5–8.1)

## 2023-12-30 LAB — LIPASE, BLOOD: Lipase: 277 U/L — ABNORMAL HIGH (ref 11–51)

## 2023-12-30 LAB — PHOSPHORUS: Phosphorus: 3.6 mg/dL (ref 2.5–4.6)

## 2023-12-30 LAB — MAGNESIUM: Magnesium: 2 mg/dL (ref 1.7–2.4)

## 2023-12-30 MED ORDER — ONDANSETRON HCL 4 MG/2ML IJ SOLN
4.0000 mg | Freq: Four times a day (QID) | INTRAMUSCULAR | Status: DC | PRN
  Filled 2023-12-30: qty 2

## 2023-12-30 MED ORDER — METHOCARBAMOL 1000 MG/10ML IJ SOLN: 500.0000 mg | Freq: Three times a day (TID) | INTRAMUSCULAR | Status: DC | PRN

## 2023-12-30 MED ORDER — ACETAMINOPHEN 650 MG RE SUPP: 650.0000 mg | Freq: Four times a day (QID) | RECTAL | Status: DC | PRN

## 2023-12-30 MED ORDER — IOHEXOL 300 MG/ML  SOLN
200.0000 mL | Freq: Once | INTRAMUSCULAR | Status: AC | PRN
  Administered 2023-12-30: 200 mL via ORAL

## 2023-12-30 MED ORDER — SODIUM CHLORIDE 0.9 % IV BOLUS
500.0000 mL | Freq: Once | INTRAVENOUS | Status: AC
  Administered 2023-12-30: 500 mL via INTRAVENOUS

## 2023-12-30 MED ORDER — ONDANSETRON HCL 4 MG PO TABS: 4.0000 mg | ORAL_TABLET | Freq: Four times a day (QID) | ORAL | Status: DC | PRN

## 2023-12-30 MED ORDER — ACETAMINOPHEN 325 MG PO TABS: 650.0000 mg | ORAL_TABLET | Freq: Four times a day (QID) | ORAL | Status: DC | PRN

## 2023-12-30 MED ORDER — SODIUM CHLORIDE 0.9 % IV SOLN
2.0000 g | Freq: Once | INTRAVENOUS | Status: AC
  Administered 2023-12-30: 2 g via INTRAVENOUS
  Filled 2023-12-30: qty 20

## 2023-12-30 MED ORDER — FENTANYL CITRATE (PF) 50 MCG/ML IJ SOSY: 12.5000 ug | PREFILLED_SYRINGE | INTRAMUSCULAR | Status: DC | PRN

## 2023-12-30 MED ORDER — PANTOPRAZOLE SODIUM 40 MG IV SOLR
40.0000 mg | Freq: Every day | INTRAVENOUS | Status: DC
  Administered 2023-12-30 – 2024-01-06 (×8): 40 mg via INTRAVENOUS
  Filled 2023-12-30 (×8): qty 10

## 2023-12-30 MED ORDER — ACETAMINOPHEN 10 MG/ML IV SOLN
1000.0000 mg | Freq: Four times a day (QID) | INTRAVENOUS | Status: AC
  Administered 2023-12-30 – 2023-12-31 (×4): 1000 mg via INTRAVENOUS
  Filled 2023-12-30 (×4): qty 100

## 2023-12-30 MED ORDER — METRONIDAZOLE 500 MG/100ML IV SOLN
500.0000 mg | Freq: Once | INTRAVENOUS | Status: AC
  Administered 2023-12-30: 500 mg via INTRAVENOUS
  Filled 2023-12-30: qty 100

## 2023-12-30 MED ORDER — ONDANSETRON HCL 4 MG/2ML IJ SOLN
4.0000 mg | Freq: Once | INTRAMUSCULAR | Status: AC
  Administered 2023-12-30: 4 mg via INTRAVENOUS
  Filled 2023-12-30: qty 2

## 2023-12-30 MED ORDER — SODIUM CHLORIDE 0.9 % IV BOLUS
1000.0000 mL | Freq: Once | INTRAVENOUS | Status: AC
  Administered 2023-12-30: 1000 mL via INTRAVENOUS

## 2023-12-30 MED ORDER — IOHEXOL 300 MG/ML  SOLN
100.0000 mL | Freq: Once | INTRAMUSCULAR | Status: AC | PRN
  Administered 2023-12-30: 80 mL via INTRAVENOUS

## 2023-12-30 MED ORDER — PIPERACILLIN-TAZOBACTAM 3.375 G IVPB
3.3750 g | Freq: Three times a day (TID) | INTRAVENOUS | Status: DC
  Administered 2023-12-30 – 2024-01-06 (×21): 3.375 g via INTRAVENOUS
  Filled 2023-12-30 (×21): qty 50

## 2023-12-30 MED ORDER — SODIUM CHLORIDE 0.9 % IV SOLN: INTRAVENOUS | Status: DC

## 2023-12-30 NOTE — ED Provider Notes (Signed)
 [place] EMERGENCY DEPARTMENT AT [facility] Provider Note   CSN: [id number] Arrival date & time: 12/30/23  9471     Patient presents with: Abdominal Pain   Melissa Tapia is a 83 y.o. female who presents to the emergency department with a chief complaint of lower abdominal pain as well as low back pain.  Patient had an ERCP performed yesterday (12/29/23) by Dr. Rosalie and began having abdominal as well as low back pain around 3-4 hours post-procedure.  Patient does appreciate a mild amount of nausea however has not had any vomiting or diarrhea.  Patient also denies fevers.  Patient has a past medical history significant for hypertension, hypercholesterolemia, as well as osteopenia. Patient has not had anything solid to eat since prior to her ERCP procedure. Denies blood thinning medication.     Abdominal Pain      Prior to Admission medications  Medication Sig Start Date End Date Taking? Authorizing Provider  atorvastatin  (LIPITOR) 20 MG tablet Take 1 tablet (20 mg total) by mouth at bedtime. 09/13/23  Yes Tolia, Sunit, DO  Calcium -Vitamin D (CALTRATE 600 PLUS-VIT D PO) Caltrate 600 plus D   Yes [provider]  Cholecalciferol 25 MCG (1000 UT) capsule Take by mouth.   Yes [provider]  Cyanocobalamin  (B-12 COMPLIANCE INJECTION IJ) Inject as directed. Inject every 3 months   Yes [provider]  cyanocobalamin  (VITAMIN B12) 1000 MCG tablet Take 1 tablet by mouth daily.   Yes [provider]  denosumab  (PROLIA ) 60 MG/ML SOSY injection first was 8/20 Subcutaneous 02/15/19  Yes [provider]  docusate sodium (COLACE) 50 MG capsule Take 50 mg by mouth daily as needed for moderate constipation.   Yes [provider]  estradiol (ESTRACE) 0.01 % CREA vaginal cream Place 0.5 g vaginally 2 (two) times a week. 09/21/23  Yes [provider]  Multiple Vitamins-Minerals (CENTRUM SILVER 50+WOMEN) TABS Take 1 tablet by mouth  daily.   Yes [provider]  Omega-3 Fatty Acids (FISH OIL) 1200 MG CAPS Take 1 capsule by mouth daily. 12/09/08  Yes [provider]  polyethylene glycol powder (GLYCOLAX/MIRALAX) 17 GM/SCOOP powder Take 17 g by mouth daily. Dissolve 1 capful (17g) in 4-8 ounces of liquid and take by mouth daily.   Yes [provider]  telmisartan (MICARDIS) 20 MG tablet 1/2 TABLET ORALLY ONCE A DAY IN THE EVENING 90 DAYS Orally Once a day for 90 days   Yes [provider]    Allergies: Aspirin and Sulfa antibiotics    Review of Systems  Gastrointestinal:  Positive for abdominal pain.    Updated Vital Signs BP 119/68 (BP Location: Right Arm)   Pulse 65   Temp 99.9 F (37.7 C) (Oral)   Resp 18   SpO2 98%   Physical Exam Vitals and nursing note reviewed.  Constitutional:      General: She is awake. She is not in acute distress.    Appearance: She is well-developed. She is not ill-appearing, toxic-appearing or diaphoretic.  HENT:     Head: Normocephalic.  Eyes:     General: No scleral icterus. Cardiovascular:     Rate and Rhythm: Normal rate and regular rhythm.  Pulmonary:     Effort: Pulmonary effort is normal. No respiratory distress.     Breath sounds: No wheezing, rhonchi or rales.  Abdominal:     General: Abdomen is flat.     Palpations: Abdomen is soft.     Tenderness:  There is generalized abdominal tenderness (Generalized lower abdominal tenderness present). There is no right CVA tenderness, left CVA tenderness, guarding or rebound.  Musculoskeletal:        General: Normal range of motion.     Right lower leg: No edema.     Left lower leg: No edema.     Comments: Grossly normal ROM of all 4 extremities  Skin:    General: Skin is warm.     Capillary Refill: Capillary refill takes less than 2 seconds.  Neurological:     General: No focal deficit present.     Mental Status: She is alert and oriented to person, place, and time.  Psychiatric:         Mood and Affect: Mood normal.        Behavior: Behavior normal. Behavior is cooperative.     (all labs ordered are listed, but only abnormal results are displayed) Labs Reviewed  COMPREHENSIVE METABOLIC PANEL WITH GFR - Abnormal; Notable for the following components:      Result Value   Glucose, Bld 145 (*)    Total Bilirubin 1.9 (*)    All other components within normal limits  LIPASE, BLOOD - Abnormal; Notable for the following components:   Lipase 277 (*)    All other components within normal limits  CBC WITH DIFFERENTIAL/PLATELET - Abnormal; Notable for the following components:   WBC 13.5 (*)    MCH 34.5 (*)    Neutro Abs 11.2 (*)    All other components within normal limits  URINALYSIS, ROUTINE W REFLEX MICROSCOPIC  MAGNESIUM   PHOSPHORUS    EKG: None  Radiology: DG UGI W SINGLE CM (SOL OR THIN BA) Result Date: 12/30/2023 CLINICAL DATA:  Patient s/p ERCP 12/29/23 who presented to the ED this morning with abdominal pain. CT abd/pelvis w/contrast shows concern for possible duodenal perforation. Request for UGI for further evaluation of potential perforation. EXAM: DG UGI W SINGLE CM TECHNIQUE: Scout radiograph was obtained. Single contrast examination was performed using water soluble contrast. This exam was performed by Clotilda Hesselbach, PA-C and was supervised and interpreted by Ester Sides, MD. FLUOROSCOPY: Radiation Exposure Index (as provided by the fluoroscopic device): 100.3 mGy Kerma COMPARISON:  CT abd/pelvis w/contrast 12/30/23 FINDINGS: Scout Radiograph: Multiple leads overlying abdomen. Non-specific bowel gas pattern. Residual IV contrast in the bladder and right renal collecting system. Esophagus: Not assessed on problem focused exam. Esophageal motility: Not assessed on problem focused exam. Stomach: Normal appearance. No hiatal hernia. Gastric emptying: Normal. Duodenum: Normal appearance. No contrast extravasation seen in standing, supine or prone positions.  Other:  None. IMPRESSION: Single contrast problem focused UGI without evidence of contrast extravasation. Electronically Signed   By: Ester Sides M.D.   On: 12/30/2023 10:44   CT ABDOMEN PELVIS W CONTRAST Result Date: 12/30/2023 EXAM: CT ABDOMEN AND PELVIS WITH CONTRAST 12/30/2023 06:30:11 AM TECHNIQUE: CT of the abdomen and pelvis was performed with the administration of 80 mL of iohexol  (OMNIPAQUE ) 300 MG/ML solution. Multiplanar reformatted images are provided for review. Automated exposure control, iterative reconstruction, and/or weight-based adjustment of the mA/kV was utilized to reduce the radiation dose to as low as reasonably achievable. COMPARISON: CT with iv contrast 12/12/2023, MRI abdomen with MRCP 12/23/2023. CLINICAL HISTORY: ERCP yesterday reportedly to remove stones. She had a 4 mm distal common bile duct stone on prior imaging. She is having lower abdominal pain and back pain since the procedure. FINDINGS: LOWER CHEST: No acute abnormality. LIVER: The liver is unremarkable aside  from a 5 mm cyst in segment 8. GALLBLADDER AND BILE DUCTS: There is contrast distention of the gallbladder with a small amount of air in the fundus anteriorly. No gallbladder wall thickening. The 4 mm distal common bile duct stone noted on both prior studies is no longer seen. There is pneumobilia in the left lobe compatible with sphincterotomy. Locules of free air are present posterior to the descending duodenum on series 2 axial 32 and 33, another near the porta hepatis on axial 27, and a small amount just above the neck of the gallbladder. Duodenal perforation is strongly considered. There is a small volume of paraduodenal fluid which tracks into the right paracolic gutter. There are inflammatory changes around the duodenum and proximal pancreas probably reactive due to leaked duodenal contents although a coexisting pancreatitis is not excluded. There is no pancreatic mass or ductal dilatation. SPLEEN: No acute  abnormality. PANCREAS: There are inflammatory changes around the duodenum and proximal pancreas probably reactive due to leaked duodenal contents. A coexisting pancreatitis is not excluded. There is no pancreatic mass or ductal dilatation. ADRENAL GLANDS: No acute abnormality. KIDNEYS, URETERS AND BLADDER: There are scattered Bosniak 1 and 2 cysts bilaterally with no follow-up imaging recommended. No stones in the kidneys or ureters. No hydronephrosis. No perinephric or periureteral stranding. There are low pelvic ureteral insertions consistent with pelvic floor laxity. There is a small cystocele extending below the level of the pelvic floor. The bladder is otherwise unremarkable. Multiple pelvic phleboliths. GI AND BOWEL: Inflammatory changes continue below the level of the duodenum into the right lower quadrant. The gastric wall is unremarkable. There is no small bowel obstruction. The retrocecal appendix is of normal caliber. There is moderate retained stool in the ascending and transverse colon. Sigmoid diverticulosis without diverticulitis. PERITONEUM AND RETROPERITONEUM: There is a small volume of paraduodenal fluid which tracks into the right paracolic gutter. Locules of free air present as described above. Duodenal perforation is strongly considered. VASCULATURE: Aorta is normal in caliber. There is aortoiliac calcific plaque without aneurysm. LYMPH NODES: No lymphadenopathy. REPRODUCTIVE ORGANS: No acute abnormality. BONES AND SOFT TISSUES: There is dextroscoliosis and degenerative change of the lumbar spine, with a mild chronic lower plate and anterior wedge compression fracture deformity of L2. No focal soft tissue abnormality. IMPRESSION: 1. Findings consistent with duodenal perforation, with small-volume free air and paraduodenal fluid, and inflammatory changes centered on the duodenum and proximal pancreas. Acute pancreatitis may also be present. 2. Post-ERCP changes with interval clearance of the  prior distal common bile duct stone and pneumobilia compatible with sphincterotomy. Multiple small stones were noted in the gallbladder on both of the prior studies and are probably being obscured by the contrast. 3. Sigmoid diverticulosis without diverticulitis. Constipation. 4. . Discussed over the phone with physician assistant Gerome at 7:02 am 12/30/2023, with verbal acknowledgement of findings. Electronically signed by: Francis Quam MD 12/30/2023 07:17 AM EST RP Workstation: [id number]   DG C-Arm 1-60 Min-No Report Result Date: 12/29/2023 Fluoroscopy was utilized by the requesting physician.  No radiographic interpretation.     .Critical Care  Performed by: Janetta Terrall FALCON, PA-C Authorized by: Janetta Terrall FALCON, PA-C   Critical care provider statement:    Critical care time (minutes):  30   Critical care was necessary to treat or prevent imminent or life-threatening deterioration of the following conditions: duodenal perforation post ERCP.   Critical care was time spent personally by me on the following activities:  Blood draw for specimens, development of treatment  plan with patient or surrogate, discussions with consultants, evaluation of patient's response to treatment, examination of patient, obtaining history from patient or surrogate, ordering and performing treatments and interventions, ordering and review of laboratory studies, ordering and review of radiographic studies, pulse oximetry, re-evaluation of patient's condition and review of old charts   Care discussed with: admitting provider      Medications Ordered in the ED  acetaminophen  (TYLENOL ) tablet 650 mg (has no administration in time range)    Or  acetaminophen  (TYLENOL ) suppository 650 mg (has no administration in time range)  ondansetron  (ZOFRAN ) tablet 4 mg (has no administration in time range)    Or  ondansetron  (ZOFRAN ) injection 4 mg (has no administration in time range)  0.9 %  sodium chloride  infusion (  Intravenous New Bag/Given 12/30/23 1013)  fentaNYL  (SUBLIMAZE ) injection 12.5-25 mcg (has no administration in time range)  acetaminophen  (OFIRMEV ) IV 1,000 mg (0 mg Intravenous Stopped 12/30/23 1159)  methocarbamol  (ROBAXIN ) injection 500 mg (has no administration in time range)  piperacillin -tazobactam (ZOSYN ) IVPB 3.375 g (0 g Intravenous Stopped 12/30/23 1508)  iohexol  (OMNIPAQUE ) 300 MG/ML solution 100 mL (80 mLs Intravenous Contrast Given 12/30/23 0624)  ondansetron  (ZOFRAN ) injection 4 mg (4 mg Intravenous Given 12/30/23 0735)  sodium chloride  0.9 % bolus 500 mL (0 mLs Intravenous Stopped 12/30/23 1146)  cefTRIAXone  (ROCEPHIN ) 2 g in sodium chloride  0.9 % 100 mL IVPB (0 g Intravenous Stopped 12/30/23 0806)  metroNIDAZOLE  (FLAGYL ) IVPB 500 mg (0 mg Intravenous Stopped 12/30/23 0823)  iohexol  (OMNIPAQUE ) 300 MG/ML solution 200 mL (200 mLs Oral Contrast Given 12/30/23 9074)                                    Medical Decision Making Amount and/or Complexity of Data Reviewed Labs: ordered.  Risk Prescription drug management. Decision regarding hospitalization.    Patient presents to the ED for concern of abdominal pain, this involves an extensive number of treatment options, and is a complaint that carries with it a high risk of complications and morbidity.  The differential diagnosis includes cholecystitis, appendicitis, diverticulitis, postop complication, pancreatitis, urinary tract infection, pyelonephritis, small bowel obstruction, etc.   Co morbidities that complicate the patient evaluation  hypertension, hypercholesterolemia, as well as osteopenia   Additional history obtained:  Additional history reviewed from ERCP that was completed yesterday by Dr. Rosalie   Lab Tests:  I Ordered, and personally interpreted labs.  The pertinent results include: CBC significant for white blood cell count of 13.5 with a neutrophil count of 11.2, total bilirubin 1.9 with no elevation  in AST or ALT or alk phos, lipase 277, urinalysis not consistent with infection   Imaging Studies ordered:  I ordered imaging studies including CT abdomen pelvis with contrast I independently visualized and interpreted imaging which showed findings consistent with duodenal perforation with small volume free air as well as inflammatory changes around the duodenum as well as pancreas, also concern for possible acute pancreatitis I agree with the radiologist interpretation   Medicines ordered and prescription drug management:  I ordered medication including fluids and Zofran  for nausea, ceftriaxone  and Flagyl  for perforated duodenum Reevaluation of the patient after these medicines showed that the patient stayed the same I have reviewed the patients home medicines and have made adjustments as needed   Test Considered:  None   Critical Interventions:  None   Consultations Obtained:  I requested consultation with the  general surgery team as well as hospitalist team and discussed lab and imaging findings as well as pertinent plan - they recommend: admission to the hospital for ongoing diagnosis and treatment   Problem List / ED Course:  83 year old female, vital signs stable, presents to the emergency department with a chief complaint of lower abdominal pain as well as back pain, post ERCP On physical exam patient overall well-appearing however does have some lower abdominal tenderness with palpation, denies fevers, vomiting but also appreciates nausea During initial exam call received from [facility] regarding concern for perforated duodenum and free air in the abdomen, appropriate orders placed including antibiotics and consult to general surgery Spoke with Dr. Debby with general surgery who will evaluate and recommends medical admission, put in for upper GI as well as NG tube Spoke with Dr. Celinda who agrees with hospital admission and will admit patient Most likely  diagnosis at this time is perforated duodenum, patient treated symptomatically and covered with antibiotics, consulted general surgery as well as hospitalist team and patient will be admitted for ongoing diagnosis and treatment, patient stable at this time and not in acute distress   Reevaluation:  After the interventions noted above, I reevaluated the patient and found that they have :stayed the same   Social Determinants of Health:  Patient lives alone   Dispostion:  After consideration of the diagnostic results and the patients response to treatment, I feel that the patient would benefit from admission to hospital for ongoing diagnosis and treatment.       Final diagnoses:  Lower abdominal pain  Perforated diverticulum of duodenum  Other acute pancreatitis, unspecified complication status    ED Discharge Orders     None          Janetta Terrall FALCON, [place] 12/30/23 1644    Franklyn Sid SAILOR, MD 01/02/24 [phone]

## 2023-12-30 NOTE — Consult Note (Signed)
 Reason for Consult:duodenal perforation after ERCP Referring:   Terrall Learn, PA  Melissa Tapia is an 83 y.o. female.  HPI:  Patient was recently diagnosed with choledocholithiasis.  She saw Dr. Rosalie who set her up for ERCP yesterday.  ERCP was successful at removing stones.  Initially, there was note in the report of a false passage, however the remainder of the study was straightforward.  The patient had some back pain that started several hours after the procedure.  This was followed by lower abdominal discomfort.  Patient called the GI office and was told to stay on clears and not to advance past that.  Patient was also advised if she had a fever or nausea and vomiting to go to the emergency department.  These did not occur, but over the course of the night she did have increasing discomfort.  She called the office back and was advised to come to the emergency department.  CT was performed which showed concern for possible duodenal perforation.  The patient denies any fevers or chills.  She continues to deny nausea, but has had some belching.  She reports the pain has gotten up to about a 7.  Past Medical History:  Diagnosis Date   Hypercholesteremia    Hypertension     Past Surgical History:  Procedure Laterality Date   TONSILLECTOMY     child    Family History  Problem Relation Age of Onset   Leukemia Mother    Hypertension Mother    Colon cancer Father     Social History:  reports that she has quit smoking. Her smoking use included cigarettes. She has been exposed to tobacco smoke. She has never used smokeless tobacco. She reports that she does not drink alcohol  and does not use drugs.  Allergies: Allergies[1]  Medications:  atorvastatin  (LIPITOR) 20 MG tablet Calcium -Vitamin D (CALTRATE 600 PLUS-VIT D PO) Cholecalciferol 25 MCG (1000 UT) capsule Cyanocobalamin  (B-12 COMPLIANCE INJECTION IJ) cyanocobalamin  (VITAMIN B12) 1000 MCG tablet denosumab  (PROLIA ) 60 MG/ML SOSY  injection docusate sodium (COLACE) 50 MG capsule estradiol (ESTRACE) 0.01 % CREA vaginal cream fluticasone  furoate-vilanterol (BREO ELLIPTA ) 100-25 MCG/ACT AEPB Multiple Vitamins-Minerals (CENTRUM SILVER 50+WOMEN) TABS Omega-3 Fatty Acids (FISH OIL) 1200 MG CAPS telmisartan (MICARDIS) 20 MG tablet  Results for orders placed or performed during the hospital encounter of 12/30/23 (from the past 48 hours)  Comprehensive metabolic panel     Status: Abnormal   Collection Time: 12/30/23  5:45 AM  Result Value Ref Range   Sodium 135 135 - 145 mmol/L   Potassium 3.8 3.5 - 5.1 mmol/L   Chloride 99 98 - 111 mmol/L   CO2 25 22 - 32 mmol/L   Glucose, Bld 145 (H) 70 - 99 mg/dL    Comment: Glucose reference range applies only to samples taken after fasting for at least 8 hours.   BUN 18 8 - 23 mg/dL   Creatinine, Ser 9.07 0.44 - 1.00 mg/dL   Calcium  9.0 8.9 - 10.3 mg/dL   Total Protein 7.0 6.5 - 8.1 g/dL   Albumin 4.3 3.5 - 5.0 g/dL   AST 40 15 - 41 U/L   ALT 37 0 - 44 U/L   Alkaline Phosphatase 63 38 - 126 U/L   Total Bilirubin 1.9 (H) 0.0 - 1.2 mg/dL   GFR, Estimated >39 >39 mL/min    Comment: (NOTE) Calculated using the CKD-EPI Creatinine Equation (2021)    Anion gap 11 5 - 15    Comment: Performed  at [facility], [address], [place], [place] [address]  Lipase, blood     Status: Abnormal   Collection Time: 12/30/23  5:45 AM  Result Value Ref Range   Lipase 277 (H) 11 - 51 U/L    Comment: Performed at [facility], [address] [place], [place] [address]  CBC with Diff     Status: Abnormal   Collection Time: 12/30/23  5:45 AM  Result Value Ref Range   WBC 13.5 (H) 4.0 - 10.5 K/uL   RBC 4.00 3.87 - 5.11 MIL/uL   Hemoglobin 13.8 12.0 - 15.0 g/dL   HCT 59.9 63.9 - 53.9 %   MCV 100.0 80.0 - 100.0 fL   MCH 34.5 (H) 26.0 - 34.0 pg   MCHC 34.5 30.0 - 36.0 g/dL   RDW 88.2 88.4 - 84.4 %   Platelets 250 150 - 400 K/uL   nRBC 0.0 0.0 - 0.2 %    Neutrophils Relative % 83 %   Neutro Abs 11.2 (H) 1.7 - 7.7 K/uL   Lymphocytes Relative 11 %   Lymphs Abs 1.4 0.7 - 4.0 K/uL   Monocytes Relative 6 %   Monocytes Absolute 0.9 0.1 - 1.0 K/uL   Eosinophils Relative 0 %   Eosinophils Absolute 0.0 0.0 - 0.5 K/uL   Basophils Relative 0 %   Basophils Absolute 0.0 0.0 - 0.1 K/uL   Immature Granulocytes 0 %   Abs Immature Granulocytes 0.06 0.00 - 0.07 K/uL    Comment: Performed at [facility], [address] [place], [place] [address]  Magnesium      Status: None   Collection Time: 12/30/23  5:45 AM  Result Value Ref Range   Magnesium  2.0 1.7 - 2.4 mg/dL    Comment: Performed at [facility], [address], [place], [place] [address]  Phosphorus     Status: None   Collection Time: 12/30/23  5:45 AM  Result Value Ref Range   Phosphorus 3.6 2.5 - 4.6 mg/dL    Comment: Performed at [facility], [address] [place], [place] [address]  Urinalysis, Routine w reflex microscopic -Urine, Clean Catch     Status: None   Collection Time: 12/30/23  6:22 AM  Result Value Ref Range   Color, Urine YELLOW YELLOW   APPearance CLEAR CLEAR   Specific Gravity, Urine 1.010 1.005 - 1.030   pH 6.0 5.0 - 8.0   Glucose, UA NEGATIVE NEGATIVE mg/dL   Hgb urine dipstick NEGATIVE NEGATIVE   Bilirubin Urine NEGATIVE NEGATIVE   Ketones, ur NEGATIVE NEGATIVE mg/dL   Protein, ur NEGATIVE NEGATIVE mg/dL   Nitrite NEGATIVE NEGATIVE   Leukocytes,Ua NEGATIVE NEGATIVE    Comment: Performed at [facility], [address], [place], [place] [address]    CT ABDOMEN PELVIS W CONTRAST Result Date: 12/30/2023 EXAM: CT ABDOMEN AND PELVIS WITH CONTRAST 12/30/2023 06:30:11 AM TECHNIQUE: CT of the abdomen and pelvis was performed with the administration of 80 mL of iohexol  (OMNIPAQUE ) 300 MG/ML solution. Multiplanar reformatted images are provided for review. Automated exposure control, iterative  reconstruction, and/or weight-based adjustment of the mA/kV was utilized to reduce the radiation dose to as low as reasonably achievable. COMPARISON: CT with iv contrast 12/12/2023, MRI abdomen with MRCP 12/23/2023. CLINICAL HISTORY: ERCP yesterday reportedly to remove stones. She had a 4 mm distal common bile duct stone on prior imaging. She is having lower abdominal pain and back pain since  the procedure. FINDINGS: LOWER CHEST: No acute abnormality. LIVER: The liver is unremarkable aside from a 5 mm cyst in segment 8. GALLBLADDER AND BILE DUCTS: There is contrast distention of the gallbladder with a small amount of air in the fundus anteriorly. No gallbladder wall thickening. The 4 mm distal common bile duct stone noted on both prior studies is no longer seen. There is pneumobilia in the left lobe compatible with sphincterotomy. Locules of free air are present posterior to the descending duodenum on series 2 axial 32 and 33, another near the porta hepatis on axial 27, and a small amount just above the neck of the gallbladder. Duodenal perforation is strongly considered. There is a small volume of paraduodenal fluid which tracks into the right paracolic gutter. There are inflammatory changes around the duodenum and proximal pancreas probably reactive due to leaked duodenal contents although a coexisting pancreatitis is not excluded. There is no pancreatic mass or ductal dilatation. SPLEEN: No acute abnormality. PANCREAS: There are inflammatory changes around the duodenum and proximal pancreas probably reactive due to leaked duodenal contents. A coexisting pancreatitis is not excluded. There is no pancreatic mass or ductal dilatation. ADRENAL GLANDS: No acute abnormality. KIDNEYS, URETERS AND BLADDER: There are scattered Bosniak 1 and 2 cysts bilaterally with no follow-up imaging recommended. No stones in the kidneys or ureters. No hydronephrosis. No perinephric or periureteral stranding. There are low pelvic  ureteral insertions consistent with pelvic floor laxity. There is a small cystocele extending below the level of the pelvic floor. The bladder is otherwise unremarkable. Multiple pelvic phleboliths. GI AND BOWEL: Inflammatory changes continue below the level of the duodenum into the right lower quadrant. The gastric wall is unremarkable. There is no small bowel obstruction. The retrocecal appendix is of normal caliber. There is moderate retained stool in the ascending and transverse colon. Sigmoid diverticulosis without diverticulitis. PERITONEUM AND RETROPERITONEUM: There is a small volume of paraduodenal fluid which tracks into the right paracolic gutter. Locules of free air present as described above. Duodenal perforation is strongly considered. VASCULATURE: Aorta is normal in caliber. There is aortoiliac calcific plaque without aneurysm. LYMPH NODES: No lymphadenopathy. REPRODUCTIVE ORGANS: No acute abnormality. BONES AND SOFT TISSUES: There is dextroscoliosis and degenerative change of the lumbar spine, with a mild chronic lower plate and anterior wedge compression fracture deformity of L2. No focal soft tissue abnormality. IMPRESSION: 1. Findings consistent with duodenal perforation, with small-volume free air and paraduodenal fluid, and inflammatory changes centered on the duodenum and proximal pancreas. Acute pancreatitis may also be present. 2. Post-ERCP changes with interval clearance of the prior distal common bile duct stone and pneumobilia compatible with sphincterotomy. Multiple small stones were noted in the gallbladder on both of the prior studies and are probably being obscured by the contrast. 3. Sigmoid diverticulosis without diverticulitis. Constipation. 4. . Discussed over the phone with physician assistant Gerome at 7:02 am 12/30/2023, with verbal acknowledgement of findings. Electronically signed by: Francis Quam MD 12/30/2023 07:17 AM EST RP Workstation: [id number]   DG C-Arm 1-60 Min-No  Report Result Date: 12/29/2023 Fluoroscopy was utilized by the requesting physician.  No radiographic interpretation.    Review of Systems  Constitutional:  Positive for appetite change. Negative for activity change, chills, diaphoresis, fatigue, fever and unexpected weight change.  HENT: Negative.    Eyes: Negative.   Respiratory: Negative.    Cardiovascular: Negative.   Gastrointestinal:  Positive for abdominal pain. Negative for abdominal distention, anal bleeding, blood in stool, constipation, diarrhea, nausea, rectal pain  and vomiting.  Endocrine: Negative.   Genitourinary:        No changes  Musculoskeletal:  Positive for back pain.  Allergic/Immunologic: Negative.   Neurological: Negative.   Hematological: Negative.   Psychiatric/Behavioral: Negative.    All other systems reviewed and are negative.  Blood pressure 138/67, pulse 69, temperature 98.2 F (36.8 C), temperature source Oral, resp. rate 16, SpO2 100%. Physical Exam Vitals reviewed. Nursing note reviewed: talkative and easily conversant. Constitutional:      General: Distressed: looks mildly uncomfortable.     Appearance: She is well-developed and normal weight. She is not ill-appearing, toxic-appearing or diaphoretic.  HENT:     Head: Normocephalic and atraumatic.  Eyes:     General: No scleral icterus.    Extraocular Movements: Extraocular movements intact.     Pupils: Pupils are equal, round, and reactive to light.  Cardiovascular:     Rate and Rhythm: Normal rate and regular rhythm.  Pulmonary:     Effort: Pulmonary effort is normal. No respiratory distress.  Chest:     Chest wall: No tenderness.  Abdominal:     General: Abdomen is protuberant. There is no distension. There are no signs of injury.     Palpations: Abdomen is soft. There is no shifting dullness, fluid wave, hepatomegaly, splenomegaly, mass or pulsatile mass.     Tenderness: There is no abdominal tenderness. There is no guarding or  rebound. Negative signs include Murphy's sign and McBurney's sign.     Hernia: No hernia is present.  Skin:    General: Skin is warm and dry.     Capillary Refill: Capillary refill takes 2 to 3 seconds.     Coloration: Skin is not cyanotic, jaundiced, mottled or pale.  Neurological:     General: No focal deficit present.     Mental Status: She is alert and oriented to person, place, and time.     Cranial Nerves: No cranial nerve deficit.  Psychiatric:        Mood and Affect: Mood normal. Mood is not anxious or depressed.        Behavior: Behavior normal.     Assessment/Plan: Choledocholithiasis Mild pancreatitis Duodenal perforation after ERCP Leukocytosis  Patient does have signs and symptoms of small duodenal perforation versus pancreatitis versus a combination of the two.  There is chemical pancreatitis which could be secondary to the gallstones themselves or result of the ERCP.  There is very minimal air around the duodenum which looks to be retroperitoneal.  There is also scant periduodenal fluid.  Given the lack of systemic symptoms and small volume findings, will obtain upper GI.  This will evaluate whether there is a large free perforation in the intraperitoneal space versus a contained perforation versus a sealed off duodenum without evidence of any continued leakage.  From the ERCP report, it is possible that this perforation came from false passage of the wire.  If this is the case, it certainly may be a punctate hole that has already sealed off.  Hopefully this is small enough and contained that it may heal on its own with bowel rest and IV antibiotics.  Discussed this with the patient and her son.  I reviewed that regardless of the findings, bowel rest would be recommended and that she would definitely need to stay here for a minimum of several days.  If we are successful in avoiding surgery today, we would need to repeat an upper GI in several days to see if the  area has  remained sealed off prior to advancing her diet.  I discussed that any initiation of the diet would also be followed closely as worsening of symptoms may still prompt surgery or additional studies.  Patient was shown diagram of anatomy.  I discussed risk of abscess and potential need for drain.  Given the patient's age of 47, will provide low-dose fentanyl  for pain medication.  I have also placed her on 24 hours of IV Tylenol .  I have made IV Robaxin  available also.  The patient states that she does not like to take pain medication unless she really needs it.  I discussed that it is important to be able to maintain her ability to take deep breaths and move around and that small amounts of pain medicine are preferable to pneumonia and blood clot.  I also discussed that other than the Tylenol , their medications are on an as-needed basis and would not be given to her without her being alert enough to communicate her discomfort and desire for medication.    Jina LITTIE Nephew, MD, FACS, FSSO Surgical Oncology, General Surgery, Trauma and Critical [facility] Surgery, [place] [phone] for weekday/non holidays Check [email or web address] for coverage night/weekend/holidays under General Surgery    12/30/2023, 9:22 AM         [1]  Allergies Allergen Reactions   Aspirin Nausea Only   Sulfa Antibiotics Rash    In 1963

## 2023-12-30 NOTE — ED Notes (Signed)
 PT is off the unit will obtain vitals signs on pt return

## 2023-12-30 NOTE — Consult Note (Signed)
 Referring Provider: Dr. Celinda Primary Care Physician:  Shayne Anes, MD Primary Gastroenterologist:  Dr. Rosalie  Reason for Consultation:  Abdominal pain; Duodenal perforation  HPI: Melissa Tapia is a 83 y.o. female who is status post ERCP yesterday for choledocholithiasis which was successful at stone removal.  Initial passage of wire reported that it might have gone into a false tract.  Postprocedure patient developed abdominal pain and back pain without fever or vomiting.  Due to persistent abdominal pain last night and early this morning she called the answering service and I recommended she go to [facility], ER for further evaluation.  Lipase 277, T. bili 1.9, ALP 63, AST 40, ALT 37.  CT showed findings consistent with duodenal perforation and possible acute pancreatitis.  Upper GI series negative for leak.  Son at bedside.  Past Medical History:  Diagnosis Date   Hypercholesteremia    Hypertension     Past Surgical History:  Procedure Laterality Date   TONSILLECTOMY     child    Prior to Admission medications  Medication Sig Start Date End Date Taking? Authorizing Provider  atorvastatin  (LIPITOR) 20 MG tablet Take 1 tablet (20 mg total) by mouth at bedtime. 09/13/23   Tolia, Sunit, DO  Calcium -Vitamin D (CALTRATE 600 PLUS-VIT D PO) Caltrate 600 plus D    [provider]  Cholecalciferol 25 MCG (1000 UT) capsule Take by mouth.    [provider]  Cyanocobalamin  (B-12 COMPLIANCE INJECTION IJ) Inject as directed. Inject every 3 months    [provider]  cyanocobalamin  (VITAMIN B12) 1000 MCG tablet Take 1 tablet by mouth daily.    [provider]  denosumab  (PROLIA ) 60 MG/ML SOSY injection first was 8/20 Subcutaneous 02/15/19   [provider]  docusate sodium (COLACE) 50 MG capsule Take 50 mg by mouth 2 (two) times daily.    [provider]  estradiol (ESTRACE) 0.01 % CREA vaginal cream Place 0.5 g vaginally. 09/21/23   [provider]  fluticasone  furoate-vilanterol (BREO ELLIPTA ) 100-25 MCG/ACT AEPB Inhale 1 puff into the lungs daily. Patient not taking: No sig reported 10/23/23   Geronimo Amel, MD  Multiple Vitamins-Minerals (CENTRUM SILVER 50+WOMEN) TABS Centrum    [provider]  Omega-3 Fatty Acids (FISH OIL) 1200 MG CAPS  12/09/08   [provider]  telmisartan (MICARDIS) 20 MG tablet 1/2 TABLET ORALLY ONCE A DAY IN THE EVENING 90 DAYS Orally Once a day for 90 days    [provider]    Scheduled Meds: Continuous Infusions:  sodium chloride  83 mL/hr at 12/30/23 1013   acetaminophen  Stopped (12/30/23 1159)   piperacillin -tazobactam     PRN Meds:.acetaminophen  **OR** acetaminophen , fentaNYL  (SUBLIMAZE ) injection, methocarbamol  (ROBAXIN ) injection, ondansetron  **OR** ondansetron  (ZOFRAN ) IV  Allergies as of 12/30/2023 - Review Complete 12/30/2023  Allergen Reaction Noted   Aspirin Nausea Only 11/08/2021   Sulfa antibiotics Rash 11/12/2020    Family History  Problem Relation Age of Onset   Leukemia Mother    Hypertension Mother    Colon cancer Father     Social History   Socioeconomic History   Marital status: Widowed    Spouse name: Not on file   Number of children: Not on file   Years of education: Not on file   Highest education level: Not on file  Occupational History   Not on file  Tobacco Use   Smoking status: Former    Types: Cigarettes    Passive exposure: Past  Smokeless tobacco: Never   Tobacco comments:    Smoked some in college socially.  Vaping Use   Vaping status: Never Used  Substance and Sexual Activity   Alcohol  use: Never   Drug use: Never   Sexual activity: Yes  Other Topics Concern   Not on file  Social History Narrative   Are you right handed or left handed? Left Handed    Are you currently employed ? No    What is your current occupation?    Do you live at home alone? Yes   Who lives with you? Husband passed away 3  years ago.   What type of home do you live in: 1 story or 2 story? Lives in a two story home and her bedroom is on the first floor.                 Social Drivers of Health   Tobacco Use: Medium Risk (12/30/2023)   Patient History    Smoking Tobacco Use: Former    Smokeless Tobacco Use: Never    Passive Exposure: Past  Physicist, Medical Strain: Not on file  Food Insecurity: Low Risk (09/21/2023)   Received from [facility]   Epic    Within the past 12 months, you worried that your food would run out before you got money to buy more: Never true    Within the past 12 months, the food you bought just didn't last and you didn't have money to get more. : Never true  Transportation Needs: No Transportation Needs (09/21/2023)   Received from [facility]    In the past 12 months, has lack of reliable transportation kept you from medical appointments, meetings, work or from getting things needed for daily living? : No  Physical Activity: Not on file  Stress: Not on file  Social Connections: Not on file  Intimate Partner Violence: Not on file  Depression (EYV7-0): Not on file  Alcohol  Screen: Not on file  Housing: Low Risk (09/21/2023)   Received from [facility]   Epic    What is your living situation today?: I have a steady place to live    Think about the place you live. Do you have problems with any of the following? Choose all that apply:: Not on file  Utilities: Low Risk (09/21/2023)   Received from [facility]   Utilities    In the past 12 months has the electric, gas, oil, or water company threatened to shut off services in your home? : No  Health Literacy: Not on file    Review of Systems: All negative except as stated above in HPI.  Physical Exam: Vital signs: Vitals:   12/30/23 1015 12/30/23 1015  BP: 138/66   Pulse: 67   Resp: 16   Temp:  98.5 F (36.9 C)  SpO2: 100%      General: Lethargic, elderly, well-nourished, pleasant, no acute  distress Head: normocephalic, atraumatic Eyes: anicteric sclera ENT: oropharynx clear Neck: supple, nontender Lungs:  Clear throughout to auscultation.   No wheezes, crackles, or rhonchi. No acute distress. Heart:  Regular rate and rhythm; no murmurs, clicks, rubs,  or gallops. Abdomen: Upper quadrant tenderness (greatest in right upper quadrant and epigastric region) with guarding, soft, nondistended, positive bowel sounds Rectal:  Deferred Ext: no edema  GI:  Lab Results: Recent Labs    12/29/23 1132 12/30/23 0545  WBC  --  13.5*  HGB 14.3 13.8  HCT 42.0 40.0  PLT  --  250   BMET Recent Labs    12/29/23 1132 12/30/23 0545  NA 141 135  K 4.2 3.8  CL 103 99  CO2  --  25  GLUCOSE 101* 145*  BUN 24* 18  CREATININE 1.10* 0.92  CALCIUM   --  9.0   LFT Recent Labs    12/30/23 0545  PROT 7.0  ALBUMIN 4.3  AST 40  ALT 37  ALKPHOS 63  BILITOT 1.9*   PT/INR No results for input(s): LABPROT, INR in the last 72 hours.    Impression/Plan: Duodenal perforation following ERCP and question of mild pancreatitis.  Choledocholithiasis removed from ERCP yesterday.  No contrast extravasation on upper GI series.  Continue IV antibiotics and bowel rest.  Agree with repeat upper GI series in approximately 3 days.  Appreciate surgery's evaluation.  Dr. Rosalie aware of her admission.  Supportive care.  Will follow.   LOS: 0 days   Jerrell JAYSON Sol  12/30/2023, 12:03 PM  Questions please call [phone]

## 2023-12-30 NOTE — H&P (Addendum)
 History and Physical    Patient: Melissa Tapia FMW:[id number] DOB: 1940/12/06 DOA: 12/30/2023 DOS: the patient was seen and examined on 12/30/2023 PCP: Shayne Anes, MD  Patient coming from: Home  Chief Complaint:  Chief Complaint  Patient presents with   Abdominal Pain   HPI: [name] is a 83 y.o. female with medical history significant of hyperlipidemia, hypertension, hypothyroidism, Gilbert's syndrome, GERD, osteoporosis, aortic atherosclerosis, obstructive cholelithiasis who underwent an ERCP with sphincterotomy lithotripsy removal of CBD calculus who presented to the emergency department yesterday around 1736 several hours later after the procedure with complaints of abdominal pain associated with belching, no significant nausea and no emesis. No constipation, melena or hematochezia.  No flank pain, dysuria, frequency or hematuria.  She denied fever, chills, rhinorrhea, sore throat, wheezing or hemoptysis.  No chest pain, palpitations, diaphoresis, PND, orthopnea or pitting edema of the lower extremities.    No polyuria, polydipsia, polyphagia or blurred vision.   Lab work: Urinalysis was normal.  CBC showed white count 13.5 with 83% neutrophils, hemoglobin 13.8 g/dL and platelets 749.  Lipase was 277 units per liter.  CMP showed a glucose of 145 and total bilirubin of 1.9 mg/dL, but was otherwise normal.  Imaging: CT abdomen/pelvis with contrast showing findings consistent with duodenal perforation with small volume free air, bile luminal fluid and inflammatory changes around the duodenum and proximal pancreas.  Acute pancreatitis may also be present.  Post ERCP changes with interval clearance of the prior distal CBD gallstone and pneumobilia compatible with a sphincterotomy.  Multiple small stones were noted in the gallbladder on both of the prior studies and are probably being obscured by the contrast.  Sigmoid diverticulosis without diverticulitis.  Constipation.  UGI series with  barium swallow did not show any extravasation of contrast.  ED course: Initial vital signs were temperature 98.2 F, pulse 76, respirations 16, BP 133/65 mmHg and O2 sat 100% on room air.  The patient received ceftriaxone  2 g IVPB, metronidazole  500 mg IVPB, ondansetron  4 mg IVP and 500 mL of normal saline bolus.  The patient developed diarrhea after ingesting barium.   Review of Systems: As mentioned in the history of present illness. All other systems reviewed and are negative. Past Medical History:  Diagnosis Date   Hypercholesteremia    Hypertension    Past Surgical History:  Procedure Laterality Date   TONSILLECTOMY     child   Social History:  reports that she has quit smoking. Her smoking use included cigarettes. She has been exposed to tobacco smoke. She has never used smokeless tobacco. She reports that she does not drink alcohol  and does not use drugs.  Allergies[1]  Family History  Problem Relation Age of Onset   Leukemia Mother    Hypertension Mother    Colon cancer Father     Prior to Admission medications  Medication Sig Start Date End Date Taking? Authorizing Provider  atorvastatin  (LIPITOR) 20 MG tablet Take 1 tablet (20 mg total) by mouth at bedtime. 09/13/23   Tolia, Sunit, DO  Calcium -Vitamin D (CALTRATE 600 PLUS-VIT D PO) Caltrate 600 plus D    [provider]  Cholecalciferol 25 MCG (1000 UT) capsule Take by mouth.    [provider]  Cyanocobalamin  (B-12 COMPLIANCE INJECTION IJ) Inject as directed. Inject every 3 months    [provider]  cyanocobalamin  (VITAMIN B12) 1000 MCG tablet Take 1 tablet by mouth daily.    [provider]  denosumab  (PROLIA ) 60 MG/ML SOSY  injection first was 8/20 Subcutaneous 02/15/19   [provider]  docusate sodium (COLACE) 50 MG capsule Take 50 mg by mouth 2 (two) times daily.    [provider]  estradiol (ESTRACE) 0.01 % CREA vaginal cream Place 0.5 g vaginally. 09/21/23    [provider]  fluticasone  furoate-vilanterol (BREO ELLIPTA ) 100-25 MCG/ACT AEPB Inhale 1 puff into the lungs daily. Patient not taking: No sig reported 10/23/23   Geronimo Amel, MD  Multiple Vitamins-Minerals (CENTRUM SILVER 50+WOMEN) TABS Centrum    [provider]  Omega-3 Fatty Acids (FISH OIL) 1200 MG CAPS  12/09/08   [provider]  telmisartan (MICARDIS) 20 MG tablet 1/2 TABLET ORALLY ONCE A DAY IN THE EVENING 90 DAYS Orally Once a day for 90 days    [provider]    Physical Exam: Vitals:   12/30/23 0537 12/30/23 0720  BP: 133/65 138/67  Pulse: 76 69  Resp: 16 16  Temp: 98.2 F (36.8 C)   TempSrc: Oral   SpO2: 100% 100%   Physical Exam Vitals and nursing note reviewed.  Constitutional:      General: She is awake. She is not in acute distress.    Appearance: She is ill-appearing.  HENT:     Head: Normocephalic.     Nose: No rhinorrhea.     Mouth/Throat:     Mouth: Mucous membranes are dry.  Eyes:     General: No scleral icterus.    Pupils: Pupils are equal, round, and reactive to light.  Neck:     Vascular: No JVD.  Cardiovascular:     Rate and Rhythm: Normal rate and regular rhythm.     Heart sounds: S1 normal and S2 normal.  Pulmonary:     Effort: Pulmonary effort is normal.     Breath sounds: Normal breath sounds. No wheezing, rhonchi or rales.  Abdominal:     Tenderness: There is abdominal tenderness.  Musculoskeletal:     Cervical back: Neck supple.     Right lower leg: No edema.     Left lower leg: No edema.  Skin:    General: Skin is warm and dry.  Neurological:     General: No focal deficit present.     Mental Status: She is alert and oriented to person, place, and time.  Psychiatric:        Mood and Affect: Mood normal.        Behavior: Behavior normal. Behavior is cooperative.     Data Reviewed:  Results are pending, will review when available.  10/16/2023 transthoracic echocardiogram  report. MPRESSIONS:   1. Left ventricular ejection fraction, by estimation, is 60 to 65%. Left  ventricular ejection fraction by 3D volume is 66 %. The left ventricle has  normal function. The left ventricle has no regional wall motion  abnormalities. Left ventricular diastolic   parameters are consistent with Grade I diastolic dysfunction (impaired  relaxation).   2. Right ventricular systolic function is normal. The right ventricular  size is normal.   3. The mitral valve is normal in structure. Trivial mitral valve  regurgitation. No evidence of mitral stenosis.   4. The aortic valve is normal in structure. Aortic valve regurgitation is  trivial. No aortic stenosis is present.   5. Aortic dilatation noted. There is mild dilatation of the ascending  aorta, measuring 42 mm.   6. The inferior vena cava is normal in size with greater than 50%  respiratory variability, suggesting right atrial pressure  of 3 mmHg.   EKG: Vent. rate 66 BPM PR interval 180 ms QRS duration 89 ms QT/QTcB 456/478 ms P-R-T axes 44 -31 21 Sinus rhythm Abnormal R-wave progression, early transition Probable left ventricular hypertrophy  Assessment and Plan: Principal Problem:   Perforation of duodenum (HCC) Associated with:   Elevated lipase PCU/inpatient. Continue IV fluids. Keep n.p.o. for now. Analgesics as needed. Antiemetics as needed. Pantoprazole  40 mg IVP daily. Follow CBC, CMP and lipase in AM. General surgery consult appreciated.  Active Problems:   Hyperglycemia In the setting of pancreatitis. Follow fasting glucose level in AM.    Essential hypertension Blood pressures were soft earlier. Hold telmisartan for now.    Gilbert syndrome Follow bilirubin level.    Gastroesophageal reflux disease without esophagitis Will begin pantoprazole  40 mg IVP every day.    Aortic atherosclerosis   Hyperlipidemia Will resume atorvastatin  once cleared for p.o. by surgery.     Advance  Care Planning:   Code Status: Full Code   Consults: [facility] and [facility].  Family Communication:   Severity of Illness: The appropriate patient status for this patient is INPATIENT. Inpatient status is judged to be reasonable and necessary in order to provide the required intensity of service to ensure the patient's safety. The patient's presenting symptoms, physical exam findings, and initial radiographic and laboratory data in the context of their chronic comorbidities is felt to place them at high risk for further clinical deterioration. Furthermore, it is not anticipated that the patient will be medically stable for discharge from the hospital within 2 midnights of admission.   * I certify that at the point of admission it is my clinical judgment that the patient will require inpatient hospital care spanning beyond 2 midnights from the point of admission due to high intensity of service, high risk for further deterioration and high frequency of surveillance required.*  Author: Alm Dorn Castor, MD 12/30/2023 8:06 AM  For on call review [email or web address].   This document was prepared using Dragon voice recognition software and may contain some unintended transcription errors.     [1]  Allergies Allergen Reactions   Aspirin Nausea Only   Sulfa Antibiotics Rash    In 1963

## 2023-12-30 NOTE — ED Triage Notes (Signed)
 Pt had a procedure yesterday to remove gallstones and is having lower abdominal pain and back pain.

## 2023-12-31 ENCOUNTER — Encounter (HOSPITAL_COMMUNITY): Payer: Self-pay | Admitting: Gastroenterology

## 2023-12-31 DIAGNOSIS — R739 Hyperglycemia, unspecified: Secondary | ICD-10-CM | POA: Diagnosis present

## 2023-12-31 DIAGNOSIS — I700 Atherosclerosis of aorta: Secondary | ICD-10-CM | POA: Diagnosis present

## 2023-12-31 DIAGNOSIS — I10 Essential (primary) hypertension: Secondary | ICD-10-CM | POA: Diagnosis present

## 2023-12-31 DIAGNOSIS — K631 Perforation of intestine (nontraumatic): Secondary | ICD-10-CM | POA: Diagnosis present

## 2023-12-31 DIAGNOSIS — R748 Abnormal levels of other serum enzymes: Secondary | ICD-10-CM | POA: Diagnosis present

## 2023-12-31 LAB — CBC
HCT: 35.2 % — ABNORMAL LOW (ref 36.0–46.0)
Hemoglobin: 11.7 g/dL — ABNORMAL LOW (ref 12.0–15.0)
MCH: 34.2 pg — ABNORMAL HIGH (ref 26.0–34.0)
MCHC: 33.2 g/dL (ref 30.0–36.0)
MCV: 102.9 fL — ABNORMAL HIGH (ref 80.0–100.0)
Platelets: 188 K/uL (ref 150–400)
RBC: 3.42 MIL/uL — ABNORMAL LOW (ref 3.87–5.11)
RDW: 12.3 % (ref 11.5–15.5)
WBC: 18.7 K/uL — ABNORMAL HIGH (ref 4.0–10.5)
nRBC: 0 % (ref 0.0–0.2)

## 2023-12-31 LAB — COMPREHENSIVE METABOLIC PANEL WITH GFR
ALT: 62 U/L — ABNORMAL HIGH (ref 0–44)
AST: 64 U/L — ABNORMAL HIGH (ref 15–41)
Albumin: 3.3 g/dL — ABNORMAL LOW (ref 3.5–5.0)
Alkaline Phosphatase: 77 U/L (ref 38–126)
Anion gap: 8 (ref 5–15)
BUN: 13 mg/dL (ref 8–23)
CO2: 24 mmol/L (ref 22–32)
Calcium: 7.9 mg/dL — ABNORMAL LOW (ref 8.9–10.3)
Chloride: 109 mmol/L (ref 98–111)
Creatinine, Ser: 1.02 mg/dL — ABNORMAL HIGH (ref 0.44–1.00)
GFR, Estimated: 54 mL/min — ABNORMAL LOW (ref >60)
Glucose, Bld: 91 mg/dL (ref 70–99)
Potassium: 4.1 mmol/L (ref 3.5–5.1)
Sodium: 141 mmol/L (ref 135–145)
Total Bilirubin: 1.8 mg/dL — ABNORMAL HIGH (ref 0.0–1.2)
Total Protein: 5.5 g/dL — ABNORMAL LOW (ref 6.5–8.1)

## 2023-12-31 LAB — LIPASE, BLOOD: Lipase: 72 U/L — ABNORMAL HIGH (ref 11–51)

## 2023-12-31 MED ORDER — ACETAMINOPHEN 10 MG/ML IV SOLN
1000.0000 mg | Freq: Four times a day (QID) | INTRAVENOUS | Status: AC
  Administered 2023-12-31 – 2024-01-01 (×4): 1000 mg via INTRAVENOUS
  Filled 2023-12-31 (×4): qty 100

## 2023-12-31 MED ORDER — DEXTROSE IN LACTATED RINGERS 5 % IV SOLN: INTRAVENOUS | Status: DC

## 2023-12-31 NOTE — Progress Notes (Signed)
 PROGRESS NOTE  Melissa Tapia FMW:[id number] DOB: [birth year]   PCP: Shayne Anes, MD  Patient is from: Home.  DOA: 12/30/2023 LOS: 1  Chief complaints Chief Complaint  Patient presents with   Abdominal Pain     Brief Narrative / Interim history: 83 year old F with PMH of HTN, hypothyroidism, Gilbert's syndrome, GERD, osteoporosis and choledocholithiasis s/p ERCP, sphincterectomy and lithotripsy on 12/29/2023 presenting to ED with severe abdominal pain and belching, and admitted with working diagnosis of possible duodenal perforation as noted on CT.   In ED, stable vitals.  WBC 13.5 with left shift.  Total bili 1.9.  Glucose 145.  Otherwise, CMP and CBC without significant finding.  CT abdomen and pelvis showed small volume free air and prior duodenal fluid and inflammatory change centered on the duodenum and proximal pancreas raising concern for duodenal perforation and acute pancreatitis.  GI and general surgery consulted.  UGI without evidence of contrast extravasation.  Patient was started on ceftriaxone , Flagyl  and IV fluid and admitted for further care.  Antibiotic changed to IV Zosyn  on admission.  The next day, felt much better.  Remains NPO.   Subjective: Seen and examined earlier this morning.  No major events overnight or this morning.  Reports feeling much better.  Denies pain other than what she calls a little twinge and tenderness in RUQ.  Has had diarrhea last night.  Denies nausea or vomiting.   Assessment and plan: Small contained duodenal perforation likely due to ERCP-CT raise concern for duodenal perforation.  UGI without contrast extravasation.  Pain much improved.  Worsening leukocytosis.  Mild tenderness without rebound or guarding. -Appreciate input by GI and general surgery -Continue IV Zosyn  -Change NS to D5-LR -IV analgesics, antiemetics and PPI -Continue n.p.o. -Repeat CT or UGI tomorrow per surgery  Acute pancreatitis-lipase 277>> 72..  CT with  evidence of acute pancreatitis.  Pain and lipase improved as well. -Management as above   Choledocholithiasis/cholelithiasis s/p ERCP, sphincterectomy and lithotripsy on 12/12. -GI on board  Elevated liver enzymes: Likely due to the above.  Mild. - Continue monitoring  Hyperglycemia: Likely stress-induced.  No history of diabetes.  Resolved.   Essential hypertension: Soft BP this morning.  Does not seem to be on medication - IV fluid as above   Bertrum syndrome: Bilirubin stable at 1.8.   GERD -PPI as above  Osteoporosis: On [facility]  outpatient Hyperlipidemia Atherosclerosis  Body mass index is 25.42 kg/m.           DVT prophylaxis:  SCDs Start: 12/30/23 9187  Code Status: Full code Family Communication: Updated patient's son at bedside Level of care: Progressive Status is: Inpatient Remains inpatient appropriate because: Contained duodenal perforation, acute pancreatitis   Final disposition: Home when medically stable   55 minutes with more than 50% spent in reviewing records, counseling patient/family and coordinating care.  Consultants:  Gastroenterology General Surgery  Procedures: None  Microbiology summarized: None  Objective: Vitals:   12/30/23 1605 12/30/23 2310 12/31/23 0301 12/31/23 0700  BP: 119/68 (!) 107/54 (!) 107/54 (!) 104/56  Pulse: 65 74 73 68  Resp: 18 18 19 18   Temp: 99.9 F (37.7 C) 98.1 F (36.7 C) 98.5 F (36.9 C) 97.9 F (36.6 C)  TempSrc: Oral Oral  Oral  SpO2: 98% 97% 97% 94%  Weight: 65.1 kg     Height: 5' 3 (1.6 m)       Examination:  GENERAL: No apparent distress.  Nontoxic. HEENT: MMM.  Vision and hearing grossly intact.  NECK: Supple.  No apparent JVD.  RESP:  No IWOB.  Fair aeration bilaterally. CVS:  RRR. Heart sounds normal.  ABD/GI/GU: BS+. Abd soft.  Mild diffuse tenderness.  No rebound or guarding. MSK/EXT:  Moves extremities. No apparent deformity. No edema.  SKIN: no apparent skin lesion or  wound NEURO: AA.  Oriented appropriately.  No apparent focal neuro deficit. PSYCH: Calm. Normal affect.   Sch Meds:  Scheduled Meds:  pantoprazole  (PROTONIX ) IV  40 mg Intravenous Daily   Continuous Infusions:  dextrose  5% lactated ringers  100 mL/hr at 12/31/23 9071   piperacillin -tazobactam 12.5 mL/hr at 12/31/23 0631   PRN Meds:.fentaNYL  (SUBLIMAZE ) injection, methocarbamol  (ROBAXIN ) injection, ondansetron  **OR** ondansetron  (ZOFRAN ) IV  Antimicrobials: Anti-infectives (From admission, onward)    Start     Dose/Rate Route Frequency Ordered Stop   12/30/23 1400  piperacillin -tazobactam (ZOSYN ) IVPB 3.375 g        3.375 g 12.5 mL/hr over 240 Minutes Intravenous Every 8 hours 12/30/23 1130     12/30/23 0730  cefTRIAXone  (ROCEPHIN ) 2 g in sodium chloride  0.9 % 100 mL IVPB        2 g 200 mL/hr over 30 Minutes Intravenous  Once 12/30/23 0723 12/30/23 0806   12/30/23 0730  metroNIDAZOLE  (FLAGYL ) IVPB 500 mg        500 mg 100 mL/hr over 60 Minutes Intravenous  Once 12/30/23 0723 12/30/23 0823        I have personally reviewed the following labs and images: CBC: Recent Labs  Lab 12/29/23 1132 12/30/23 0545 12/31/23 0357  WBC  --  13.5* 18.7*  NEUTROABS  --  11.2*  --   HGB 14.3 13.8 11.7*  HCT 42.0 40.0 35.2*  MCV  --  100.0 102.9*  PLT  --  250 188   BMP &GFR Recent Labs  Lab 12/29/23 1132 12/30/23 0545 12/31/23 0357  NA 141 135 141  K 4.2 3.8 4.1  CL 103 99 109  CO2  --  25 24  GLUCOSE 101* 145* 91  BUN 24* 18 13  CREATININE 1.10* 0.92 1.02*  CALCIUM   --  9.0 7.9*  MG  --  2.0  --   PHOS  --  3.6  --    Estimated Creatinine Clearance: 37.9 mL/min (A) (by C-G formula based on SCr of 1.02 mg/dL (H)). Liver & Pancreas: Recent Labs  Lab 12/30/23 0545 12/31/23 0357  AST 40 64*  ALT 37 62*  ALKPHOS 63 77  BILITOT 1.9* 1.8*  PROT 7.0 5.5*  ALBUMIN 4.3 3.3*   Recent Labs  Lab 12/30/23 0545 12/31/23 0357  LIPASE 277* 72*   No results for input(s):  AMMONIA in the last 168 hours. Diabetic: No results for input(s): HGBA1C in the last 72 hours. No results for input(s): GLUCAP in the last 168 hours. Cardiac Enzymes: No results for input(s): CKTOTAL, CKMB, CKMBINDEX, TROPONINI in the last 168 hours. No results for input(s): PROBNP in the last 8760 hours. Coagulation Profile: No results for input(s): INR, PROTIME in the last 168 hours. Thyroid  Function Tests: No results for input(s): TSH, T4TOTAL, FREET4, T3FREE, THYROIDAB in the last 72 hours. Lipid Profile: No results for input(s): CHOL, HDL, LDLCALC, TRIG, CHOLHDL, LDLDIRECT in the last 72 hours. Anemia Panel: No results for input(s): VITAMINB12, FOLATE, FERRITIN, TIBC, IRON, RETICCTPCT in the last 72 hours. Urine analysis:    Component Value Date/Time   COLORURINE YELLOW 12/30/2023 0622   APPEARANCEUR CLEAR 12/30/2023 0622   LABSPEC 1.010 12/30/2023 0622  PHURINE 6.0 12/30/2023 0622   GLUCOSEU NEGATIVE 12/30/2023 0622   HGBUR NEGATIVE 12/30/2023 0622   BILIRUBINUR NEGATIVE 12/30/2023 0622   KETONESUR NEGATIVE 12/30/2023 0622   PROTEINUR NEGATIVE 12/30/2023 0622   NITRITE NEGATIVE 12/30/2023 0622   LEUKOCYTESUR NEGATIVE 12/30/2023 0622   Sepsis Labs: Invalid input(s): PROCALCITONIN, LACTICIDVEN  Microbiology: No results found for this or any previous visit (from the past 240 hours).  Radiology Studies: No results found.    Jeanann Balinski T. Silvia Hightower [facility]  If 7PM-7AM, please contact night-coverage [email or web address] 12/31/2023, 10:31 AM

## 2023-12-31 NOTE — Progress Notes (Signed)
 Subjective/Chief Complaint: Patient reports feeling much better today.  Less back and abdominal pain. Tylenol  controlled this.    No n/v.  No appreciable fevers.  Patient had some diarrhea and felt that getting cleaned out helped her symptoms a lot.     Objective: Vital signs in last 24 hours: Temp:  [97.9 F (36.6 C)-99.9 F (37.7 C)] 97.9 F (36.6 C) (12/14 0700) Pulse Rate:  [63-75] 68 (12/14 0700) Resp:  [16-19] 18 (12/14 0700) BP: (104-138)/(54-89) 104/56 (12/14 0700) SpO2:  [94 %-100 %] 94 % (12/14 0700) Weight:  [65.1 kg] 65.1 kg (12/13 1605) Last BM Date : 12/30/23  Intake/Output from previous day: 12/13 0701 - 12/14 0700 In: 1704.8 [I.V.:1188.4; IV Piggyback:516.4] Out: -  Intake/Output this shift: No intake/output data recorded.  General:  sleeping, easily awakened. Resp:  breathing comfortably Abd:  soft, non distended, non tender.    Lab Results:  Recent Labs    12/30/23 0545 12/31/23 0357  WBC 13.5* 18.7*  HGB 13.8 11.7*  HCT 40.0 35.2*  PLT 250 188   BMET Recent Labs    12/30/23 0545 12/31/23 0357  NA 135 141  K 3.8 4.1  CL 99 109  CO2 25 24  GLUCOSE 145* 91  BUN 18 13  CREATININE 0.92 1.02*  CALCIUM  9.0 7.9*   PT/INR No results for input(s): LABPROT, INR in the last 72 hours. ABG No results for input(s): PHART, HCO3 in the last 72 hours.  Invalid input(s): PCO2, PO2  Studies/Results: DG UGI W SINGLE CM (SOL OR THIN BA) Result Date: 12/30/2023 CLINICAL DATA:  Patient s/p ERCP 12/29/23 who presented to the ED this morning with abdominal pain. CT abd/pelvis w/contrast shows concern for possible duodenal perforation. Request for UGI for further evaluation of potential perforation. EXAM: DG UGI W SINGLE CM TECHNIQUE: Scout radiograph was obtained. Single contrast examination was performed using water soluble contrast. This exam was performed by Clotilda Hesselbach, PA-C and was supervised and interpreted by Ester Sides, MD.  FLUOROSCOPY: Radiation Exposure Index (as provided by the fluoroscopic device): 100.3 mGy Kerma COMPARISON:  CT abd/pelvis w/contrast 12/30/23 FINDINGS: Scout Radiograph: Multiple leads overlying abdomen. Non-specific bowel gas pattern. Residual IV contrast in the bladder and right renal collecting system. Esophagus: Not assessed on problem focused exam. Esophageal motility: Not assessed on problem focused exam. Stomach: Normal appearance. No hiatal hernia. Gastric emptying: Normal. Duodenum: Normal appearance. No contrast extravasation seen in standing, supine or prone positions. Other:  None. IMPRESSION: Single contrast problem focused UGI without evidence of contrast extravasation. Electronically Signed   By: Ester Sides M.D.   On: 12/30/2023 10:44   CT ABDOMEN PELVIS W CONTRAST Result Date: 12/30/2023 EXAM: CT ABDOMEN AND PELVIS WITH CONTRAST 12/30/2023 06:30:11 AM TECHNIQUE: CT of the abdomen and pelvis was performed with the administration of 80 mL of iohexol  (OMNIPAQUE ) 300 MG/ML solution. Multiplanar reformatted images are provided for review. Automated exposure control, iterative reconstruction, and/or weight-based adjustment of the mA/kV was utilized to reduce the radiation dose to as low as reasonably achievable. COMPARISON: CT with iv contrast 12/12/2023, MRI abdomen with MRCP 12/23/2023. CLINICAL HISTORY: ERCP yesterday reportedly to remove stones. She had a 4 mm distal common bile duct stone on prior imaging. She is having lower abdominal pain and back pain since the procedure. FINDINGS: LOWER CHEST: No acute abnormality. LIVER: The liver is unremarkable aside from a 5 mm cyst in segment 8. GALLBLADDER AND BILE DUCTS: There is contrast distention of the gallbladder with a small  amount of air in the fundus anteriorly. No gallbladder wall thickening. The 4 mm distal common bile duct stone noted on both prior studies is no longer seen. There is pneumobilia in the left lobe compatible with  sphincterotomy. Locules of free air are present posterior to the descending duodenum on series 2 axial 32 and 33, another near the porta hepatis on axial 27, and a small amount just above the neck of the gallbladder. Duodenal perforation is strongly considered. There is a small volume of paraduodenal fluid which tracks into the right paracolic gutter. There are inflammatory changes around the duodenum and proximal pancreas probably reactive due to leaked duodenal contents although a coexisting pancreatitis is not excluded. There is no pancreatic mass or ductal dilatation. SPLEEN: No acute abnormality. PANCREAS: There are inflammatory changes around the duodenum and proximal pancreas probably reactive due to leaked duodenal contents. A coexisting pancreatitis is not excluded. There is no pancreatic mass or ductal dilatation. ADRENAL GLANDS: No acute abnormality. KIDNEYS, URETERS AND BLADDER: There are scattered Bosniak 1 and 2 cysts bilaterally with no follow-up imaging recommended. No stones in the kidneys or ureters. No hydronephrosis. No perinephric or periureteral stranding. There are low pelvic ureteral insertions consistent with pelvic floor laxity. There is a small cystocele extending below the level of the pelvic floor. The bladder is otherwise unremarkable. Multiple pelvic phleboliths. GI AND BOWEL: Inflammatory changes continue below the level of the duodenum into the right lower quadrant. The gastric wall is unremarkable. There is no small bowel obstruction. The retrocecal appendix is of normal caliber. There is moderate retained stool in the ascending and transverse colon. Sigmoid diverticulosis without diverticulitis. PERITONEUM AND RETROPERITONEUM: There is a small volume of paraduodenal fluid which tracks into the right paracolic gutter. Locules of free air present as described above. Duodenal perforation is strongly considered. VASCULATURE: Aorta is normal in caliber. There is aortoiliac calcific  plaque without aneurysm. LYMPH NODES: No lymphadenopathy. REPRODUCTIVE ORGANS: No acute abnormality. BONES AND SOFT TISSUES: There is dextroscoliosis and degenerative change of the lumbar spine, with a mild chronic lower plate and anterior wedge compression fracture deformity of L2. No focal soft tissue abnormality. IMPRESSION: 1. Findings consistent with duodenal perforation, with small-volume free air and paraduodenal fluid, and inflammatory changes centered on the duodenum and proximal pancreas. Acute pancreatitis may also be present. 2. Post-ERCP changes with interval clearance of the prior distal common bile duct stone and pneumobilia compatible with sphincterotomy. Multiple small stones were noted in the gallbladder on both of the prior studies and are probably being obscured by the contrast. 3. Sigmoid diverticulosis without diverticulitis. Constipation. 4. . Discussed over the phone with physician assistant Gerome at 7:02 am 12/30/2023, with verbal acknowledgement of findings. Electronically signed by: Francis Quam MD 12/30/2023 07:17 AM EST RP Workstation: [id number]   DG C-Arm 1-60 Min-No Report Result Date: 12/29/2023 Fluoroscopy was utilized by the requesting physician.  No radiographic interpretation.    Anti-infectives: Anti-infectives (From admission, onward)    Start     Dose/Rate Route Frequency Ordered Stop   12/30/23 1400  piperacillin -tazobactam (ZOSYN ) IVPB 3.375 g        3.375 g 12.5 mL/hr over 240 Minutes Intravenous Every 8 hours 12/30/23 1130     12/30/23 0730  cefTRIAXone  (ROCEPHIN ) 2 g in sodium chloride  0.9 % 100 mL IVPB        2 g 200 mL/hr over 30 Minutes Intravenous  Once 12/30/23 0723 12/30/23 0806   12/30/23 0730  metroNIDAZOLE  (FLAGYL ) IVPB 500  mg        500 mg 100 mL/hr over 60 Minutes Intravenous  Once 12/30/23 0723 12/30/23 9176       Assessment/Plan: s/p ERCP with small contained perforation WBCs and temp went up, but patient subjectively feels much  better. Will keep NPO today with IV fluids.   Repeat imaging tomorrow.  CT vs upper GI.  Hold on pills.     LOS: 1 day   Jina LITTIE Nephew, MD, FACS, FSSO Surgical Oncology, General Surgery, Trauma and Critical [facility], [place] [phone] for weekday/non holidays Check [email or web address] for coverage night/weekend/holidays under General Surgery

## 2023-12-31 NOTE — Progress Notes (Signed)
 Melissa Tapia [facility] Progress Note  Melissa Tapia 83 y.o. 07-27-1940   Subjective: Abdominal pain has improved significantly compared to yesterday.  Denies nausea vomiting.  Sleeping comfortably.  Objective: Vital signs: Vitals:   12/31/23 0301 12/31/23 0700  BP: (!) 107/54 (!) 104/56  Pulse: 73 68  Resp: 19 18  Temp: 98.5 F (36.9 C) 97.9 F (36.6 C)  SpO2: 97% 94%    Physical Exam: Gen: Lethargic, well-nourished, no acute distress, pleasant HEENT: anicteric sclera CV: RRR Chest: CTA B Abd: Minimal right upper quadrant tenderness with guarding, minimal suprapubic tenderness without guarding, soft, nondistended, positive bowel sounds Ext: no edema  Lab Results: Recent Labs    12/30/23 0545 12/31/23 0357  NA 135 141  K 3.8 4.1  CL 99 109  CO2 25 24  GLUCOSE 145* 91  BUN 18 13  CREATININE 0.92 1.02*  CALCIUM  9.0 7.9*  MG 2.0  --   PHOS 3.6  --    Recent Labs    12/30/23 0545 12/31/23 0357  AST 40 64*  ALT 37 62*  ALKPHOS 63 77  BILITOT 1.9* 1.8*  PROT 7.0 5.5*  ALBUMIN 4.3 3.3*   Recent Labs    12/30/23 0545 12/31/23 0357  WBC 13.5* 18.7*  NEUTROABS 11.2*  --   HGB 13.8 11.7*  HCT 40.0 35.2*  MCV 100.0 102.9*  PLT 250 188      Assessment/Plan: Duodenal perforation and question of mild pancreatitis post-ERCP on 12/12 for choledocholithiasis.  Upper GI series yesterday negative for leak.  Clinically improving.  Continue broad-spectrum antibiotics.  Defer whether she can start clear liquid diet to surgery team.  Supportive care.  Dr. Rosalie will follow-up tomorrow.   Jerrell JAYSON Sol 12/31/2023, 8:57 AM  Questions please call [phone] ID: Melissa Tapia, female   DOB: 1940-06-25, 83 y.o.   MRN: [id number]

## 2023-12-31 NOTE — Plan of Care (Signed)
  Problem: Clinical Measurements: Goal: Will remain free from infection Outcome: Progressing   Problem: Clinical Measurements: Goal: Ability to maintain clinical measurements within normal limits will improve Outcome: Progressing   Problem: Clinical Measurements: Goal: Diagnostic test results will improve Outcome: Progressing   Problem: Clinical Measurements: Goal: Respiratory complications will improve Outcome: Progressing

## 2024-01-01 ENCOUNTER — Inpatient Hospital Stay (HOSPITAL_COMMUNITY)

## 2024-01-01 LAB — COMPREHENSIVE METABOLIC PANEL WITH GFR
ALT: 55 U/L — ABNORMAL HIGH (ref 0–44)
AST: 50 U/L — ABNORMAL HIGH (ref 15–41)
Albumin: 3 g/dL — ABNORMAL LOW (ref 3.5–5.0)
Alkaline Phosphatase: 121 U/L (ref 38–126)
Anion gap: 5 (ref 5–15)
BUN: 10 mg/dL (ref 8–23)
CO2: 25 mmol/L (ref 22–32)
Calcium: 7.8 mg/dL — ABNORMAL LOW (ref 8.9–10.3)
Chloride: 112 mmol/L — ABNORMAL HIGH (ref 98–111)
Creatinine, Ser: 0.96 mg/dL (ref 0.44–1.00)
GFR, Estimated: 59 mL/min — ABNORMAL LOW (ref >60)
Glucose, Bld: 126 mg/dL — ABNORMAL HIGH (ref 70–99)
Potassium: 3.4 mmol/L — ABNORMAL LOW (ref 3.5–5.1)
Sodium: 142 mmol/L (ref 135–145)
Total Bilirubin: 1.7 mg/dL — ABNORMAL HIGH (ref 0.0–1.2)
Total Protein: 5.2 g/dL — ABNORMAL LOW (ref 6.5–8.1)

## 2024-01-01 LAB — CBC
HCT: 33.2 % — ABNORMAL LOW (ref 36.0–46.0)
Hemoglobin: 11.2 g/dL — ABNORMAL LOW (ref 12.0–15.0)
MCH: 34.4 pg — ABNORMAL HIGH (ref 26.0–34.0)
MCHC: 33.7 g/dL (ref 30.0–36.0)
MCV: 101.8 fL — ABNORMAL HIGH (ref 80.0–100.0)
Platelets: 165 K/uL (ref 150–400)
RBC: 3.26 MIL/uL — ABNORMAL LOW (ref 3.87–5.11)
RDW: 12.4 % (ref 11.5–15.5)
WBC: 14.6 K/uL — ABNORMAL HIGH (ref 4.0–10.5)
nRBC: 0 % (ref 0.0–0.2)

## 2024-01-01 LAB — LIPASE, BLOOD: Lipase: 30 U/L (ref 11–51)

## 2024-01-01 LAB — MAGNESIUM: Magnesium: 2.2 mg/dL (ref 1.7–2.4)

## 2024-01-01 MED ORDER — IOHEXOL 300 MG/ML  SOLN
100.0000 mL | Freq: Once | INTRAMUSCULAR | Status: AC | PRN
  Administered 2024-01-01: 15:00:00 100 mL via INTRAVENOUS

## 2024-01-01 MED ORDER — POTASSIUM CHLORIDE 10 MEQ/100ML IV SOLN
10.0000 meq | INTRAVENOUS | Status: AC
  Administered 2024-01-01 (×4): 10 meq via INTRAVENOUS
  Filled 2024-01-01 (×4): qty 100

## 2024-01-01 MED ORDER — SODIUM CHLORIDE (PF) 0.9 % IJ SOLN
INTRAMUSCULAR | Status: AC
  Filled 2024-01-01: qty 50

## 2024-01-01 MED ORDER — IOHEXOL 9 MG/ML PO SOLN
500.0000 mL | ORAL | Status: AC
  Administered 2024-01-01 (×2): 500 mL via ORAL

## 2024-01-01 NOTE — Progress Notes (Signed)
 PROGRESS NOTE  Melissa Tapia FMW:[id number] DOB: 1940-11-30   PCP: Shayne Anes, MD  Patient is from: Home.  DOA: 12/30/2023 LOS: 2  Chief complaints Chief Complaint  Patient presents with   Abdominal Pain     Brief Narrative / Interim history: 83 year old F with PMH of HTN, hypothyroidism, Gilbert's syndrome, GERD, osteoporosis and choledocholithiasis s/p ERCP, sphincterectomy and lithotripsy on 12/29/2023 presenting to ED with severe abdominal pain and belching, and admitted with working diagnosis of possible duodenal perforation as noted on CT.   In ED, stable vitals.  WBC 13.5 with left shift.  Total bili 1.9.  Glucose 145.  Otherwise, CMP and CBC without significant finding.  CT abdomen and pelvis showed small volume free air and prior duodenal fluid and inflammatory change centered on the duodenum and proximal pancreas raising concern for duodenal perforation and acute pancreatitis.  GI and general surgery consulted.  UGI without evidence of contrast extravasation.  Patient was started on ceftriaxone , Flagyl  and IV fluid and admitted for further care.  Antibiotic changed to IV Zosyn  on admission.  The next day, felt much better.  Remains NPO.  GI and surgery following.   Subjective: Seen and examined earlier this morning.  No major events overnight or this morning.  Denies pain, nausea or vomiting.  She reports diarrhea.  Assessment and plan: Small contained duodenal perforation likely due to ERCP-CT raise concern for duodenal perforation.  UGI without contrast extravasation.  Pain resolved.  No nausea or vomiting.  Leukocytosis improved.  No tenderness on exam. -Appreciate input by GI and general surgery -Continue strict n.p.o., IV Zosyn , IVF, IV Tylenol , Protonix  and antiemetics -Follow repeat CT.  Acute pancreatitis-lipase 277>> 72>>> 30..  CT with evidence of acute pancreatitis.  Pain resolved.  Lipase normalized. -Management as above    Choledocholithiasis/cholelithiasis s/p ERCP, sphincterectomy and lithotripsy on 12/12.  LFT improved. -GI on board  Elevated liver enzymes: Likely due to the above.  Mild and improved. - Continue monitoring  Hyperglycemia: Likely stress-induced.  No history of diabetes.  - Monitor with daily labs while on D5 infusion.   Essential hypertension: Soft BP.  - IV fluid as above  Hypokalemia -Monitor replenish K and Mg as appropriate   Gilbert syndrome: Bilirubin stable at 1.8.   GERD -PPI as above  Osteoporosis: On [facility]  outpatient Hyperlipidemia Atherosclerosis  Body mass index is 25.42 kg/m.           DVT prophylaxis:  SCDs Start: 12/30/23 9187  Code Status: Full code Family Communication: None at bedside. Level of care: Progressive Status is: Inpatient Remains inpatient appropriate because: Contained duodenal perforation, acute pancreatitis   Final disposition: Home when medically stable   55 minutes with more than 50% spent in reviewing records, counseling patient/family and coordinating care.  Consultants:  Gastroenterology General Surgery  Procedures: None  Microbiology summarized: None  Objective: Vitals:   12/31/23 0700 12/31/23 1338 12/31/23 2104 01/01/24 0535  BP: (!) 104/56 (!) 113/54 (!) 111/54 (!) 111/57  Pulse: 68 66 73 67  Resp: 18 20 18 18   Temp: 97.9 F (36.6 C) 98.2 F (36.8 C) 99.1 F (37.3 C) 98.7 F (37.1 C)  TempSrc: Oral Oral Oral Oral  SpO2: 94% 98% 92% 98%  Weight:      Height:        Examination:  GENERAL: No apparent distress.  Nontoxic. HEENT: MMM.  Vision and hearing grossly intact.  NECK: Supple.  No apparent JVD.  RESP:  No IWOB.  Fair aeration  bilaterally. CVS:  RRR. Heart sounds normal.  ABD/GI/GU: BS+. Abd soft.  NTND. MSK/EXT:  Moves extremities. No apparent deformity. No edema.  SKIN: no apparent skin lesion or wound NEURO: AA.  Oriented appropriately.  No apparent focal neuro deficit. PSYCH: Calm.  Normal affect.   Sch Meds:  Scheduled Meds:  pantoprazole  (PROTONIX ) IV  40 mg Intravenous Daily   Continuous Infusions:  acetaminophen  1,000 mg (01/01/24 0706)   dextrose  5% lactated ringers  100 mL/hr at 01/01/24 0537   piperacillin -tazobactam 3.375 g (01/01/24 0539)   potassium chloride  10 mEq (01/01/24 1141)   PRN Meds:.fentaNYL  (SUBLIMAZE ) injection, methocarbamol  (ROBAXIN ) injection, ondansetron  **OR** ondansetron  (ZOFRAN ) IV  Antimicrobials: Anti-infectives (From admission, onward)    Start     Dose/Rate Route Frequency Ordered Stop   12/30/23 1400  piperacillin -tazobactam (ZOSYN ) IVPB 3.375 g        3.375 g 12.5 mL/hr over 240 Minutes Intravenous Every 8 hours 12/30/23 1130     12/30/23 0730  cefTRIAXone  (ROCEPHIN ) 2 g in sodium chloride  0.9 % 100 mL IVPB        2 g 200 mL/hr over 30 Minutes Intravenous  Once 12/30/23 0723 12/30/23 0806   12/30/23 0730  metroNIDAZOLE  (FLAGYL ) IVPB 500 mg        500 mg 100 mL/hr over 60 Minutes Intravenous  Once 12/30/23 0723 12/30/23 0823        I have personally reviewed the following labs and images: CBC: Recent Labs  Lab 12/29/23 1132 12/30/23 0545 12/31/23 0357 01/01/24 0327  WBC  --  13.5* 18.7* 14.6*  NEUTROABS  --  11.2*  --   --   HGB 14.3 13.8 11.7* 11.2*  HCT 42.0 40.0 35.2* 33.2*  MCV  --  100.0 102.9* 101.8*  PLT  --  250 188 165   BMP &GFR Recent Labs  Lab 12/29/23 1132 12/30/23 0545 12/31/23 0357 01/01/24 0327  NA 141 135 141 142  K 4.2 3.8 4.1 3.4*  CL 103 99 109 112*  CO2  --  25 24 25   GLUCOSE 101* 145* 91 126*  BUN 24* 18 13 10   CREATININE 1.10* 0.92 1.02* 0.96  CALCIUM   --  9.0 7.9* 7.8*  MG  --  2.0  --  2.2  PHOS  --  3.6  --   --    Estimated Creatinine Clearance: 40.3 mL/min (by C-G formula based on SCr of 0.96 mg/dL). Liver & Pancreas: Recent Labs  Lab 12/30/23 0545 12/31/23 0357 01/01/24 0327  AST 40 64* 50*  ALT 37 62* 55*  ALKPHOS 63 77 121  BILITOT 1.9* 1.8* 1.7*  PROT 7.0  5.5* 5.2*  ALBUMIN 4.3 3.3* 3.0*   Recent Labs  Lab 12/30/23 0545 12/31/23 0357 01/01/24 0327  LIPASE 277* 72* 30   No results for input(s): AMMONIA in the last 168 hours. Diabetic: No results for input(s): HGBA1C in the last 72 hours. No results for input(s): GLUCAP in the last 168 hours. Cardiac Enzymes: No results for input(s): CKTOTAL, CKMB, CKMBINDEX, TROPONINI in the last 168 hours. No results for input(s): PROBNP in the last 8760 hours. Coagulation Profile: No results for input(s): INR, PROTIME in the last 168 hours. Thyroid  Function Tests: No results for input(s): TSH, T4TOTAL, FREET4, T3FREE, THYROIDAB in the last 72 hours. Lipid Profile: No results for input(s): CHOL, HDL, LDLCALC, TRIG, CHOLHDL, LDLDIRECT in the last 72 hours. Anemia Panel: No results for input(s): VITAMINB12, FOLATE, FERRITIN, TIBC, IRON, RETICCTPCT in the last 72 hours. Urine  analysis:    Component Value Date/Time   COLORURINE YELLOW 12/30/2023 0622   APPEARANCEUR CLEAR 12/30/2023 0622   LABSPEC 1.010 12/30/2023 0622   PHURINE 6.0 12/30/2023 0622   GLUCOSEU NEGATIVE 12/30/2023 0622   HGBUR NEGATIVE 12/30/2023 0622   BILIRUBINUR NEGATIVE 12/30/2023 0622   KETONESUR NEGATIVE 12/30/2023 0622   PROTEINUR NEGATIVE 12/30/2023 0622   NITRITE NEGATIVE 12/30/2023 0622   LEUKOCYTESUR NEGATIVE 12/30/2023 0622   Sepsis Labs: Invalid input(s): PROCALCITONIN, LACTICIDVEN  Microbiology: No results found for this or any previous visit (from the past 240 hours).  Radiology Studies: No results found.    Brylea Pita T. Brittin Janik [facility]  If 7PM-7AM, please contact night-coverage [email or web address] 01/01/2024, 12:21 PM

## 2024-01-01 NOTE — Progress Notes (Signed)
° °  Subjective/Chief Complaint: No complaints   Objective: Vital signs in last 24 hours: Temp:  [97.6 F (36.4 C)-99.1 F (37.3 C)] 97.6 F (36.4 C) (12/15 1335) Pulse Rate:  [60-73] 60 (12/15 1335) Resp:  [18] 18 (12/15 0535) BP: (111-134)/(54-70) 134/70 (12/15 1335) SpO2:  [92 %-98 %] 97 % (12/15 1335) Last BM Date : 01/01/24  Intake/Output from previous day: 12/14 0701 - 12/15 0700 In: 1196.1 [I.V.:1096.1; IV Piggyback:100] Out: -  Intake/Output this shift: Total I/O In: 782.8 [I.V.:372.5; IV Piggyback:410.3] Out: -   General appearance: alert and cooperative Resp: clear to auscultation bilaterally Cardio: regular rate and rhythm GI: soft, nontender  Lab Results:  Recent Labs    12/31/23 0357 01/01/24 0327  WBC 18.7* 14.6*  HGB 11.7* 11.2*  HCT 35.2* 33.2*  PLT 188 165   BMET Recent Labs    12/31/23 0357 01/01/24 0327  NA 141 142  K 4.1 3.4*  CL 109 112*  CO2 24 25  GLUCOSE 91 126*  BUN 13 10  CREATININE 1.02* 0.96  CALCIUM  7.9* 7.8*   PT/INR No results for input(s): LABPROT, INR in the last 72 hours. ABG No results for input(s): PHART, HCO3 in the last 72 hours.  Invalid input(s): PCO2, PO2  Studies/Results: No results found.  Anti-infectives: Anti-infectives (From admission, onward)    Start     Dose/Rate Route Frequency Ordered Stop   12/30/23 1400  piperacillin -tazobactam (ZOSYN ) IVPB 3.375 g        3.375 g 12.5 mL/hr over 240 Minutes Intravenous Every 8 hours 12/30/23 1130     12/30/23 0730  cefTRIAXone  (ROCEPHIN ) 2 g in sodium chloride  0.9 % 100 mL IVPB        2 g 200 mL/hr over 30 Minutes Intravenous  Once 12/30/23 0723 12/30/23 0806   12/30/23 0730  metroNIDAZOLE  (FLAGYL ) IVPB 500 mg        500 mg 100 mL/hr over 60 Minutes Intravenous  Once 12/30/23 0723 12/30/23 0823       Assessment/Plan: s/p * No surgery found * Duodenal perf during ercp Continue bowel rest and IV antibiotics for now.  Will repeat CT scan  because of low-grade fever and elevated white count.  If CT scan is negative for leak or abscess then will allow clears Will follow closely with you  LOS: 2 days    Deward Null III 01/01/2024

## 2024-01-01 NOTE — Progress Notes (Signed)
 Mobility Specialist - Progress Note   01/01/24 1304  Mobility  Activity Pivoted/transferred to/from [facility];Ambulated with assistance  Level of Assistance Standby assist, set-up cues, supervision of patient - no hands on  Assistive Device None  Distance Ambulated (ft) 200 ft  Range of Motion/Exercises Active  Activity Response Tolerated well  Mobility visit 1 Mobility  Mobility Specialist Start Time (ACUTE ONLY) 1245  Mobility Specialist Stop Time (ACUTE ONLY) 1304  Mobility Specialist Time Calculation (min) (ACUTE ONLY) 19 min   Pt was found on recliner chair and agreeable to mobilize. No complaints. At EOS returned to recliner chair with all needs met. Call bell in reach.   Melissa Tapia,  Mobility Specialist Can be reached via Secure Chat

## 2024-01-01 NOTE — Progress Notes (Signed)
 Mobility Specialist - Progress Note   01/01/24 1554  Mobility  Activity Ambulated independently  Level of Assistance Modified independent, requires aide device or extra time  Assistive Device None  Distance Ambulated (ft) 350 ft  Range of Motion/Exercises Active  Activity Response Tolerated well  Mobility visit 1 Mobility  Mobility Specialist Start Time (ACUTE ONLY) 1540  Mobility Specialist Stop Time (ACUTE ONLY) 1554  Mobility Specialist Time Calculation (min) (ACUTE ONLY) 14 min   Pt was found on BSC and agreeable to mobilize afterwards. No complaints. At EOS returned to recliner chair with all needs met. Call bell in reach.   Erminio Leos,  Mobility Specialist Can be reached via Secure Chat

## 2024-01-01 NOTE — Progress Notes (Signed)
 Melissa Tapia 1:37 PM  Subjective: Patient seen and examined and her hospital computer reviewed and her case discussed with my partner Dr. Dianna multiple times this weekend and she is feeling better without any pain nausea vomiting and we answered all of her questions and rediscussed her procedure  Objective: Vital signs stable afebrile no acute distress abdomen is soft nontender white count decreased LFTs white count decreased BUN and creatinine okay hemoglobin slight drop repeat CT pending  Assessment: Status post ERCP complicated with perforation  Plan: Hopefully CT will be okay and she will be able to start clear liquids and I will check on tomorrow  [facility]  office [phone] After 5PM or if no answer call [phone]

## 2024-01-02 ENCOUNTER — Ambulatory Visit (HOSPITAL_COMMUNITY)
Admission: RE | Admit: 2024-01-02 | Discharge: 2023-12-29 | Disposition: A | Attending: Gastroenterology | Admitting: Gastroenterology

## 2024-01-02 ENCOUNTER — Encounter (HOSPITAL_COMMUNITY): Admission: RE | Disposition: A | Payer: Self-pay | Attending: Gastroenterology

## 2024-01-02 DIAGNOSIS — I10 Essential (primary) hypertension: Secondary | ICD-10-CM | POA: Diagnosis not present

## 2024-01-02 DIAGNOSIS — K631 Perforation of intestine (nontraumatic): Secondary | ICD-10-CM | POA: Diagnosis not present

## 2024-01-02 DIAGNOSIS — R748 Abnormal levels of other serum enzymes: Secondary | ICD-10-CM | POA: Diagnosis not present

## 2024-01-02 DIAGNOSIS — I700 Atherosclerosis of aorta: Secondary | ICD-10-CM | POA: Diagnosis not present

## 2024-01-02 DIAGNOSIS — R739 Hyperglycemia, unspecified: Secondary | ICD-10-CM | POA: Diagnosis not present

## 2024-01-02 LAB — COMPREHENSIVE METABOLIC PANEL WITH GFR
ALT: 53 U/L — ABNORMAL HIGH (ref 0–44)
AST: 46 U/L — ABNORMAL HIGH (ref 15–41)
Albumin: 3.2 g/dL — ABNORMAL LOW (ref 3.5–5.0)
Alkaline Phosphatase: 191 U/L — ABNORMAL HIGH (ref 38–126)
Anion gap: 7 (ref 5–15)
BUN: 8 mg/dL (ref 8–23)
CO2: 25 mmol/L (ref 22–32)
Calcium: 7.9 mg/dL — ABNORMAL LOW (ref 8.9–10.3)
Chloride: 109 mmol/L (ref 98–111)
Creatinine, Ser: 0.9 mg/dL (ref 0.44–1.00)
GFR, Estimated: >60 mL/min (ref >60)
Glucose, Bld: 115 mg/dL — ABNORMAL HIGH (ref 70–99)
Potassium: 3.3 mmol/L — ABNORMAL LOW (ref 3.5–5.1)
Sodium: 141 mmol/L (ref 135–145)
Total Bilirubin: 1.4 mg/dL — ABNORMAL HIGH (ref 0.0–1.2)
Total Protein: 5.4 g/dL — ABNORMAL LOW (ref 6.5–8.1)

## 2024-01-02 LAB — CBC
HCT: 34 % — ABNORMAL LOW (ref 36.0–46.0)
Hemoglobin: 11.5 g/dL — ABNORMAL LOW (ref 12.0–15.0)
MCH: 34.1 pg — ABNORMAL HIGH (ref 26.0–34.0)
MCHC: 33.8 g/dL (ref 30.0–36.0)
MCV: 100.9 fL — ABNORMAL HIGH (ref 80.0–100.0)
Platelets: 190 K/uL (ref 150–400)
RBC: 3.37 MIL/uL — ABNORMAL LOW (ref 3.87–5.11)
RDW: 12.4 % (ref 11.5–15.5)
WBC: 10 K/uL (ref 4.0–10.5)
nRBC: 0 % (ref 0.0–0.2)

## 2024-01-02 LAB — MAGNESIUM: Magnesium: 2.2 mg/dL (ref 1.7–2.4)

## 2024-01-02 MED ORDER — POTASSIUM CHLORIDE 10 MEQ/100ML IV SOLN
10.0000 meq | INTRAVENOUS | Status: AC
  Administered 2024-01-02: 09:00:00 10 meq via INTRAVENOUS
  Filled 2024-01-02 (×2): qty 100

## 2024-01-02 MED ORDER — ORAL CARE MOUTH RINSE: 15.0000 mL | OROMUCOSAL | Status: DC | PRN

## 2024-01-02 MED ORDER — POTASSIUM CHLORIDE 10 MEQ/100ML IV SOLN
10.0000 meq | INTRAVENOUS | Status: AC
  Administered 2024-01-02 (×3): 10 meq via INTRAVENOUS
  Filled 2024-01-02 (×2): qty 100

## 2024-01-02 NOTE — Progress Notes (Signed)
 Subjective: No pain today.  No nausea.  Up ambulating around the unit  ROS: See above, otherwise other systems negative  Objective: Vital signs in last 24 hours: Temp:  [97.6 F (36.4 C)-98.9 F (37.2 C)] 98.9 F (37.2 C) (12/16 0604) Pulse Rate:  [60-78] 78 (12/16 0604) Resp:  [17-19] 19 (12/16 0604) BP: (125-134)/(64-70) 125/66 (12/16 0604) SpO2:  [96 %-100 %] 96 % (12/16 0604) Last BM Date : 01/02/24  Intake/Output from previous day: 12/15 0701 - 12/16 0700 In: 2133 [I.V.:1622.6; IV Piggyback:510.4] Out: -  Intake/Output this shift: No intake/output data recorded.  PE: Gen: NAD, ambulating Abd: soft, NT, ND  Lab Results:  Recent Labs    01/01/24 0327 01/02/24 0430  WBC 14.6* 10.0  HGB 11.2* 11.5*  HCT 33.2* 34.0*  PLT 165 190   BMET Recent Labs    01/01/24 0327 01/02/24 0430  NA 142 141  K 3.4* 3.3*  CL 112* 109  CO2 25 25  GLUCOSE 126* 115*  BUN 10 8  CREATININE 0.96 0.90  CALCIUM  7.8* 7.9*   PT/INR No results for input(s): LABPROT, INR in the last 72 hours. CMP     Component Value Date/Time   NA 141 01/02/2024 0430   NA 143 11/16/2023 0900   K 3.3 (L) 01/02/2024 0430   CL 109 01/02/2024 0430   CO2 25 01/02/2024 0430   GLUCOSE 115 (H) 01/02/2024 0430   BUN 8 01/02/2024 0430   BUN 17 11/16/2023 0900   CREATININE 0.90 01/02/2024 0430   CALCIUM  7.9 (L) 01/02/2024 0430   PROT 5.4 (L) 01/02/2024 0430   PROT 6.8 11/16/2023 0900   ALBUMIN 3.2 (L) 01/02/2024 0430   ALBUMIN 4.5 11/16/2023 0900   AST 46 (H) 01/02/2024 0430   ALT 53 (H) 01/02/2024 0430   ALKPHOS 191 (H) 01/02/2024 0430   BILITOT 1.4 (H) 01/02/2024 0430   BILITOT 1.4 (H) 11/16/2023 0900   GFRNONAA >60 01/02/2024 0430   Lipase     Component Value Date/Time   LIPASE 30 01/01/2024 0327       Studies/Results: CT ABDOMEN PELVIS W CONTRAST Result Date: 01/01/2024 EXAM: CT ABDOMEN AND PELVIS WITH CONTRAST 01/01/2024 03:42:48 PM TECHNIQUE: CT of the abdomen and  pelvis was performed with the administration of 100 mL of iohexol  (OMNIPAQUE ) 300 MG/ML solution. Multiplanar reformatted images are provided for review. Automated exposure control, iterative reconstruction, and/or weight-based adjustment of the mA/kV was utilized to reduce the radiation dose to as low as reasonably achievable. COMPARISON: CT Abdomen and Pelvis dated 12/30/2023. CLINICAL HISTORY: Follow-up on perforation from prior CT examination. FINDINGS: LOWER CHEST: A small right-sided pleural effusion is noted, new from the prior exam. No parenchymal nodule is seen. No focal infiltrate is noted. LIVER: The liver is within normal limits. GALLBLADDER AND BILE DUCTS: The gallbladder is well distended with multiple dependent stones. Some pericholecystic fluid is noted, new from the prior exam. Mild perihepatic fluid is noted at the liver tip. Pneumobilia is noted related to recent sphincterotomy. No biliary ductal dilatation. SPLEEN: No acute abnormality. PANCREAS: No acute abnormality. ADRENAL GLANDS: No acute abnormality. KIDNEYS, URETERS AND BLADDER: No stones in the kidneys or ureters. No hydronephrosis. No perinephric or periureteral stranding. Urinary bladder is unremarkable. GI AND BOWEL: A mild amount of fluid is noted surrounding the posterior aspect of the duodenum with small foci of air, similar to that seen on the prior exam. The degree of fluid has increased somewhat in the interval  from the prior study. The stomach and small bowel are otherwise within normal limits. No obstructive or inflammatory changes of the colon are seen. Prominent PERITONEUM AND RETROPERITONEUM: Mild free fluid is noted in the pelvis, new from the prior exam. No free air. VASCULATURE: Aorta is normal in caliber. Aortic calcifications are noted. LYMPH NODES: No lymphadenopathy. REPRODUCTIVE ORGANS: No acute abnormality. BONES AND SOFT TISSUES: No acute osseous abnormality. No focal soft tissue abnormality. IMPRESSION: 1. Mild  fluid surrounding the posterior duodenum with small foci of air, increased from prior, again concerning for duodenal perforation. Increased free fluid in the abdomen and pelvis is noted as well. 2. Gallbladder with multiple dependent stones . New pericholecystic fluid is noted .this is likely related to the changes in the second portion of the duodenum as opposed to true acute cholecystitis. Correlate with the physical exam. 3. New small right-sided pleural effusion. Electronically signed by: Oneil Devonshire MD 01/01/2024 08:15 PM EST RP Workstation: [id number]    Anti-infectives: Anti-infectives (From admission, onward)    Start     Dose/Rate Route Frequency Ordered Stop   12/30/23 1400  piperacillin -tazobactam (ZOSYN ) IVPB 3.375 g        3.375 g 12.5 mL/hr over 240 Minutes Intravenous Every 8 hours 12/30/23 1130     12/30/23 0730  cefTRIAXone  (ROCEPHIN ) 2 g in sodium chloride  0.9 % 100 mL IVPB        2 g 200 mL/hr over 30 Minutes Intravenous  Once 12/30/23 0723 12/30/23 0806   12/30/23 0730  metroNIDAZOLE  (FLAGYL ) IVPB 500 mg        500 mg 100 mL/hr over 60 Minutes Intravenous  Once 12/30/23 9276 12/30/23 9176        Assessment/Plan Duodenal perforation during ERCP for choledocholithiasis -repeat CT scan yesterday revealed slight increase in foci of air around the duodenum with increase in fluid from last imaging. -we would plan to keep her NPO today and tomorrow with plans for repeat imaging, UGI, on Thursday -WBC has normalized today to 10 and she is AF without pain, which are all good. -may need picc/TNA if we end up needing to further delay initiation of oral intake.  Will monitor closely   FEN - NPO/IVFs per primary VTE - ok for prophylaxis from our standpoint ID - zosyn   HTN GERD HLD  I reviewed Consultant GI notes, hospitalist notes, last 24 h vitals and pain scores, last 48 h intake and output, last 24 h labs and trends, and last 24 h imaging results.   LOS: 3 days     Burnard FORBES Banter , [facility] 01/02/2024, 10:42 AM Please see Amion for pager number during day hours 7:00am-4:30pm or 7:00am -11:30am on weekends

## 2024-01-02 NOTE — Progress Notes (Signed)
 Mobility Specialist - Progress Note   01/02/24 1033  Mobility  Activity Ambulated independently  Level of Assistance Modified independent, requires aide device or extra time  Assistive Device None  Distance Ambulated (ft) 350 ft  Range of Motion/Exercises Active  Activity Response Tolerated well  Mobility visit 1 Mobility  Mobility Specialist Start Time (ACUTE ONLY) 1005  Mobility Specialist Stop Time (ACUTE ONLY) 1033  Mobility Specialist Time Calculation (min) (ACUTE ONLY) 28 min   Pt was found on recliner chair and agreeable to mobilize. Had x1 brief standing rest break to speak to General Surgery PA. At EOS returned to recliner chair with all needs met. Call bell in reach.   Erminio Leos,  Mobility Specialist Can be reached via Secure Chat

## 2024-01-02 NOTE — Progress Notes (Signed)
°   01/02/24 1627  TOC Brief Assessment  Insurance and Status Reviewed  Patient has primary care physician Yes  Home environment has been reviewed home with spouse  Prior level of function: mod-independent  Prior/Current Home Services No current home services  Social Drivers of Health Review SDOH reviewed no interventions necessary  Readmission risk has been reviewed Yes  Transition of care needs no transition of care needs at this time

## 2024-01-02 NOTE — Progress Notes (Signed)
 Melissa Tapia 9:41 AM  Subjective: Patient doing well not sleeping well because of hospital interruptions did have a little nausea last night still having loose stools without signs of bleeding and we answered all of her questions and discussed her CT scan and she has no new complaints  Objective: Vital signs stable afebrile no acute distress abdomen is soft nontender alk phos slight increase other liver test decreased BUN and creatinine okay white count decreased hemoglobin stable CT reviewed  Assessment: Status post ERCP with duodenal perforation  Plan: Okay with me to try sips of clear liquids and will await surgical opinion and if they disagree and feel she needs to be n.p.o. okay to change her back however I am hopeful she will do well and be able to avoid surgery and will check on tomorrow and case discussed with her nurse as well  [facility]  office [phone] After 5PM or if no answer call [phone]

## 2024-01-02 NOTE — Progress Notes (Signed)
 Mobility Specialist - Progress Note   01/02/24 1609  Mobility  Activity Ambulated independently  Level of Assistance Modified independent, requires aide device or extra time  Assistive Device None  Distance Ambulated (ft) 350 ft  Range of Motion/Exercises Active  Activity Response Tolerated well  Mobility visit 1 Mobility  Mobility Specialist Start Time (ACUTE ONLY) 1550  Mobility Specialist Stop Time (ACUTE ONLY) 1607  Mobility Specialist Time Calculation (min) (ACUTE ONLY) 17 min   Pt was found on recliner chair and agreeable to mobilize. No complaints. At EOS returned to recliner chair with all needs met. Call bell in reach and RN in room.   Erminio Leos,  Mobility Specialist Can be reached via Secure Chat

## 2024-01-02 NOTE — Progress Notes (Signed)
 PROGRESS NOTE  Melissa Tapia FMW:[id number] DOB: [birth year]   PCP: Shayne Anes, MD  Patient is from: Home.  DOA: 12/30/2023 LOS: 3  Chief complaints Chief Complaint  Patient presents with   Abdominal Pain     Brief Narrative / Interim history: 83 year old F with PMH of HTN, hypothyroidism, Gilbert's syndrome, GERD, osteoporosis and choledocholithiasis s/p ERCP, sphincterectomy and lithotripsy on 12/29/2023 presenting to ED with severe abdominal pain and belching, and admitted with working diagnosis of possible duodenal perforation as noted on CT.   In ED, stable vitals.  WBC 13.5 with left shift.  Total bili 1.9.  Glucose 145.  Otherwise, CMP and CBC without significant finding.  CT abdomen and pelvis showed small volume free air and prior duodenal fluid and inflammatory change centered on the duodenum and proximal pancreas raising concern for duodenal perforation and acute pancreatitis.  GI and general surgery consulted.  UGI without evidence of contrast extravasation.  Patient was started on ceftriaxone , Flagyl  and IV fluid and admitted for further care.  Antibiotic changed to IV Zosyn  on admission.  The next day, felt much better.  Repeat CT abdomen and pelvis on 12/15 with slightly increased foci of air around the duodenum with increased fluid.  Remains NPO.  GI and surgery following.   Subjective: Seen and examined earlier this morning.  No major events overnight or this morning.  Had some nausea last night that has resolved.  Continues to have diarrhea.  Denies abdominal pain.  Denies nausea and vomiting this morning.  Assessment and plan: Small contained duodenal perforation likely due to ERCP-CT raise concern for duodenal perforation.  UGI without contrast extravasation.  Repeat CT abdomen and pelvis on 12/15 with slightly increased foci of air around the duodenum with increased fluid.  GI symptoms improved significantly.  Leukocytosis resolved. -Appreciate input by GI and  general surgery -Continue strict n.p.o., IV Zosyn , IVF, IV Tylenol , Protonix  and antiemetics  Acute pancreatitis-lipase 277>> 72>>> 30.  Initial CT with evidence of pancreatitis.  Normal pancreas on repeat CT.  Pain resolved.   -Management as above   Choledocholithiasis/cholelithiasis s/p ERCP, sphincterectomy and lithotripsy on 12/12.  LFT improved. -GI on board  Elevated liver enzymes: Likely due to the above.  Mild and improved. - Continue monitoring  Hyperglycemia: Likely stress-induced.  No history of diabetes.  - Monitor with daily labs while on D5 infusion.   Essential hypertension: Soft BP.  - IV fluid as above  Hypokalemia -Monitor replenish K and Mg as appropriate   Gilbert syndrome: Bilirubin stable at 1.8.   GERD -PPI as above  Osteoporosis: On [facility]  outpatient Hyperlipidemia Atherosclerosis  Body mass index is 25.42 kg/m.           DVT prophylaxis:  SCDs Start: 12/30/23 9187  Code Status: Full code Family Communication: None at bedside. Level of care: Progressive Status is: Inpatient Remains inpatient appropriate because: Contained duodenal perforation, acute pancreatitis   Final disposition: Home when medically stable   55 minutes with more than 50% spent in reviewing records, counseling patient/family and coordinating care.  Consultants:  Gastroenterology General Surgery  Procedures: None  Microbiology summarized: None  Objective: Vitals:   01/01/24 0535 01/01/24 1335 01/01/24 1957 01/02/24 0604  BP: (!) 111/57 134/70 134/64 125/66  Pulse: 67 60 66 78  Resp: 18  17 19   Temp: 98.7 F (37.1 C) 97.6 F (36.4 C) 98.2 F (36.8 C) 98.9 F (37.2 C)  TempSrc: Oral Oral Oral Oral  SpO2: 98% 97% 100% 96%  Weight:      Height:        Examination:  GENERAL: No apparent distress.  Nontoxic. HEENT: MMM.  Vision and hearing grossly intact.  NECK: Supple.  No apparent JVD.  RESP:  No IWOB.  Fair aeration bilaterally. CVS:  RRR.  Heart sounds normal.  ABD/GI/GU: BS+. Abd soft.  NTND. MSK/EXT:  Moves extremities. No apparent deformity. No edema.  SKIN: no apparent skin lesion or wound NEURO: AA.  Oriented appropriately.  No apparent focal neuro deficit. PSYCH: Calm. Normal affect.   Sch Meds:  Scheduled Meds:  pantoprazole  (PROTONIX ) IV  40 mg Intravenous Daily   Continuous Infusions:  dextrose  5% lactated ringers  100 mL/hr at 01/02/24 0620   piperacillin -tazobactam 3.375 g (01/02/24 0618)   potassium chloride  10 mEq (01/02/24 0920)   PRN Meds:.fentaNYL  (SUBLIMAZE ) injection, methocarbamol  (ROBAXIN ) injection, ondansetron  **OR** ondansetron  (ZOFRAN ) IV  Antimicrobials: Anti-infectives (From admission, onward)    Start     Dose/Rate Route Frequency Ordered Stop   12/30/23 1400  piperacillin -tazobactam (ZOSYN ) IVPB 3.375 g        3.375 g 12.5 mL/hr over 240 Minutes Intravenous Every 8 hours 12/30/23 1130     12/30/23 0730  cefTRIAXone  (ROCEPHIN ) 2 g in sodium chloride  0.9 % 100 mL IVPB        2 g 200 mL/hr over 30 Minutes Intravenous  Once 12/30/23 0723 12/30/23 0806   12/30/23 0730  metroNIDAZOLE  (FLAGYL ) IVPB 500 mg        500 mg 100 mL/hr over 60 Minutes Intravenous  Once 12/30/23 0723 12/30/23 0823        I have personally reviewed the following labs and images: CBC: Recent Labs  Lab 12/29/23 1132 12/30/23 0545 12/31/23 0357 01/01/24 0327 01/02/24 0430  WBC  --  13.5* 18.7* 14.6* 10.0  NEUTROABS  --  11.2*  --   --   --   HGB 14.3 13.8 11.7* 11.2* 11.5*  HCT 42.0 40.0 35.2* 33.2* 34.0*  MCV  --  100.0 102.9* 101.8* 100.9*  PLT  --  250 188 165 190   BMP &GFR Recent Labs  Lab 12/29/23 1132 12/30/23 0545 12/31/23 0357 01/01/24 0327 01/02/24 0430  NA 141 135 141 142 141  K 4.2 3.8 4.1 3.4* 3.3*  CL 103 99 109 112* 109  CO2  --  25 24 25 25   GLUCOSE 101* 145* 91 126* 115*  BUN 24* 18 13 10 8   CREATININE 1.10* 0.92 1.02* 0.96 0.90  CALCIUM   --  9.0 7.9* 7.8* 7.9*  MG  --  2.0   --  2.2 2.2  PHOS  --  3.6  --   --   --    Estimated Creatinine Clearance: 43 mL/min (by C-G formula based on SCr of 0.9 mg/dL). Liver & Pancreas: Recent Labs  Lab 12/30/23 0545 12/31/23 0357 01/01/24 0327 01/02/24 0430  AST 40 64* 50* 46*  ALT 37 62* 55* 53*  ALKPHOS 63 77 121 191*  BILITOT 1.9* 1.8* 1.7* 1.4*  PROT 7.0 5.5* 5.2* 5.4*  ALBUMIN 4.3 3.3* 3.0* 3.2*   Recent Labs  Lab 12/30/23 0545 12/31/23 0357 01/01/24 0327  LIPASE 277* 72* 30   No results for input(s): AMMONIA in the last 168 hours. Diabetic: No results for input(s): HGBA1C in the last 72 hours. No results for input(s): GLUCAP in the last 168 hours. Cardiac Enzymes: No results for input(s): CKTOTAL, CKMB, CKMBINDEX, TROPONINI in the last 168 hours. No results for input(s): PROBNP  in the last 8760 hours. Coagulation Profile: No results for input(s): INR, PROTIME in the last 168 hours. Thyroid  Function Tests: No results for input(s): TSH, T4TOTAL, FREET4, T3FREE, THYROIDAB in the last 72 hours. Lipid Profile: No results for input(s): CHOL, HDL, LDLCALC, TRIG, CHOLHDL, LDLDIRECT in the last 72 hours. Anemia Panel: No results for input(s): VITAMINB12, FOLATE, FERRITIN, TIBC, IRON, RETICCTPCT in the last 72 hours. Urine analysis:    Component Value Date/Time   COLORURINE YELLOW 12/30/2023 0622   APPEARANCEUR CLEAR 12/30/2023 0622   LABSPEC 1.010 12/30/2023 0622   PHURINE 6.0 12/30/2023 0622   GLUCOSEU NEGATIVE 12/30/2023 0622   HGBUR NEGATIVE 12/30/2023 0622   BILIRUBINUR NEGATIVE 12/30/2023 0622   KETONESUR NEGATIVE 12/30/2023 0622   PROTEINUR NEGATIVE 12/30/2023 0622   NITRITE NEGATIVE 12/30/2023 0622   LEUKOCYTESUR NEGATIVE 12/30/2023 0622   Sepsis Labs: Invalid input(s): PROCALCITONIN, LACTICIDVEN  Microbiology: No results found for this or any previous visit (from the past 240 hours).  Radiology Studies: CT ABDOMEN PELVIS W  CONTRAST Result Date: 01/01/2024 EXAM: CT ABDOMEN AND PELVIS WITH CONTRAST 01/01/2024 03:42:48 PM TECHNIQUE: CT of the abdomen and pelvis was performed with the administration of 100 mL of iohexol  (OMNIPAQUE ) 300 MG/ML solution. Multiplanar reformatted images are provided for review. Automated exposure control, iterative reconstruction, and/or weight-based adjustment of the mA/kV was utilized to reduce the radiation dose to as low as reasonably achievable. COMPARISON: CT Abdomen and Pelvis dated 12/30/2023. CLINICAL HISTORY: Follow-up on perforation from prior CT examination. FINDINGS: LOWER CHEST: A small right-sided pleural effusion is noted, new from the prior exam. No parenchymal nodule is seen. No focal infiltrate is noted. LIVER: The liver is within normal limits. GALLBLADDER AND BILE DUCTS: The gallbladder is well distended with multiple dependent stones. Some pericholecystic fluid is noted, new from the prior exam. Mild perihepatic fluid is noted at the liver tip. Pneumobilia is noted related to recent sphincterotomy. No biliary ductal dilatation. SPLEEN: No acute abnormality. PANCREAS: No acute abnormality. ADRENAL GLANDS: No acute abnormality. KIDNEYS, URETERS AND BLADDER: No stones in the kidneys or ureters. No hydronephrosis. No perinephric or periureteral stranding. Urinary bladder is unremarkable. GI AND BOWEL: A mild amount of fluid is noted surrounding the posterior aspect of the duodenum with small foci of air, similar to that seen on the prior exam. The degree of fluid has increased somewhat in the interval from the prior study. The stomach and small bowel are otherwise within normal limits. No obstructive or inflammatory changes of the colon are seen. Prominent PERITONEUM AND RETROPERITONEUM: Mild free fluid is noted in the pelvis, new from the prior exam. No free air. VASCULATURE: Aorta is normal in caliber. Aortic calcifications are noted. LYMPH NODES: No lymphadenopathy. REPRODUCTIVE ORGANS:  No acute abnormality. BONES AND SOFT TISSUES: No acute osseous abnormality. No focal soft tissue abnormality. IMPRESSION: 1. Mild fluid surrounding the posterior duodenum with small foci of air, increased from prior, again concerning for duodenal perforation. Increased free fluid in the abdomen and pelvis is noted as well. 2. Gallbladder with multiple dependent stones . New pericholecystic fluid is noted .this is likely related to the changes in the second portion of the duodenum as opposed to true acute cholecystitis. Correlate with the physical exam. 3. New small right-sided pleural effusion. Electronically signed by: Oneil Devonshire MD 01/01/2024 08:15 PM EST RP Workstation: [id number] Simmer T. Lizanne Erker [facility]  If 7PM-7AM, please contact night-coverage [email or web address] 01/02/2024, 12:03 PM

## 2024-01-03 DIAGNOSIS — K631 Perforation of intestine (nontraumatic): Secondary | ICD-10-CM | POA: Diagnosis not present

## 2024-01-03 LAB — MAGNESIUM: Magnesium: 2 mg/dL (ref 1.7–2.4)

## 2024-01-03 LAB — COMPREHENSIVE METABOLIC PANEL WITH GFR
ALT: 49 U/L — ABNORMAL HIGH (ref 0–44)
AST: 45 U/L — ABNORMAL HIGH (ref 15–41)
Albumin: 3 g/dL — ABNORMAL LOW (ref 3.5–5.0)
Alkaline Phosphatase: 230 U/L — ABNORMAL HIGH (ref 38–126)
Anion gap: 7 (ref 5–15)
BUN: <5 mg/dL — ABNORMAL LOW (ref 8–23)
CO2: 24 mmol/L (ref 22–32)
Calcium: 7.9 mg/dL — ABNORMAL LOW (ref 8.9–10.3)
Chloride: 111 mmol/L (ref 98–111)
Creatinine, Ser: 0.84 mg/dL (ref 0.44–1.00)
GFR, Estimated: >60 mL/min (ref >60)
Glucose, Bld: 115 mg/dL — ABNORMAL HIGH (ref 70–99)
Potassium: 3.2 mmol/L — ABNORMAL LOW (ref 3.5–5.1)
Sodium: 142 mmol/L (ref 135–145)
Total Bilirubin: 1.2 mg/dL (ref 0.0–1.2)
Total Protein: 5.4 g/dL — ABNORMAL LOW (ref 6.5–8.1)

## 2024-01-03 LAB — CBC
HCT: 31.9 % — ABNORMAL LOW (ref 36.0–46.0)
Hemoglobin: 10.8 g/dL — ABNORMAL LOW (ref 12.0–15.0)
MCH: 33.6 pg (ref 26.0–34.0)
MCHC: 33.9 g/dL (ref 30.0–36.0)
MCV: 99.4 fL (ref 80.0–100.0)
Platelets: 201 K/uL (ref 150–400)
RBC: 3.21 MIL/uL — ABNORMAL LOW (ref 3.87–5.11)
RDW: 12.4 % (ref 11.5–15.5)
WBC: 8.4 K/uL (ref 4.0–10.5)
nRBC: 0 % (ref 0.0–0.2)

## 2024-01-03 MED ORDER — POTASSIUM CHLORIDE 10 MEQ/100ML IV SOLN
10.0000 meq | INTRAVENOUS | Status: AC
  Administered 2024-01-03 (×4): 10 meq via INTRAVENOUS
  Filled 2024-01-03 (×4): qty 100

## 2024-01-03 NOTE — Progress Notes (Signed)
 Mobility Specialist - Progress Note   01/03/24 1020  Mobility  Activity Ambulated independently  Level of Assistance Modified independent, requires aide device or extra time  Assistive Device None  Distance Ambulated (ft) 750 ft  Range of Motion/Exercises Active  Activity Response Tolerated well  Mobility visit 1 Mobility  Mobility Specialist Start Time (ACUTE ONLY) 1000  Mobility Specialist Stop Time (ACUTE ONLY) 1020  Mobility Specialist Time Calculation (min) (ACUTE ONLY) 20 min   Pt was found on recliner chair and agreeable to mobilize. No complaints. At EOS returned to recliner chair with all needs met. Call bell in reach.   Erminio Leos,  Mobility Specialist Can be reached via Secure Chat

## 2024-01-03 NOTE — Plan of Care (Signed)

## 2024-01-03 NOTE — Progress Notes (Signed)
 Melissa Tapia 12:51 PM  Subjective: Patient seen and examined and discussed with surgical team and she is not having any pain and her diarrhea is better and other than the hard bed and her IV she has no other complaints  Objective: Vital signs stable afebrile no acute distress in good spirits abdomen is soft nontender BUN and creatinine okay alk phos increased other liver test decreased white count decreased  Assessment: Duodenal perforation secondary to ERCP currently stable  Plan: Await repeat Gastrografin upper GI to rule out leak and if so we will allow clear liquids after that and I answered all of the the patient's questions and will check on tomorrow  [facility] E  office [phone] After 5PM or if no answer call [phone]

## 2024-01-03 NOTE — Progress Notes (Signed)
 [facility] Progress Note  Patient: Melissa Tapia     FMW:[id number]  DOA: 12/30/2023   PCP: Shayne Anes, MD       Brief hospital course: This is an 83 year old with Gilbert's syndrome, GERD, choledocholithiasis who underwent an ERCP, sphincterotomy and lithotripsy on 12/29/2023.  She presented to the hospital on 12/13 with abdominal pain, WBC count of 13.5 and a T. bili of 1.9. CT of the abdomen pelvis revealed small volume free air, duodenal fluid and inflammatory changes centered in the duodenum and proximal pancreas raising concern for duodenal perforation. The patient was started on Rocephin  and Flagyl  and then transition to Zosyn .  GI and general surgery consult was requested.  Upper GI did not reveal any extravasation of contrast.  She was continued in her n.p.o. status.  Subjective:  Abdominal pain has improved.  No nausea and vomiting.  She is having some loose stools.  Assessment and Plan: Principal Problem:   Perforation of duodenum - Related to recent ERCP - Appreciate GI and general surgery assistance - Continue Zosyn  and IV fluids while n.p.o. - Appears clinically improved - Plan for upper GI tomorrow  Active Problems: Elevated lipase - Possibly secondary to acute pancreatitis versus above process  Cholelithiasis-recent choledocholithiasis - Will need cholecystectomy as outpatient - LFTs improving  Hypokalemia - Continue to replace    Essential hypertension   Gilbert syndrome   Gastroesophageal reflux disease without esophagitis   Aortic atherosclerosis   Hyperlipidemia       Code Status: Full Code Total time on patient care: 35 minutes DVT prophylaxis:  SCDs Start: 12/30/23 0812     Objective:   Vitals:   01/02/24 2055 01/02/24 2057 01/03/24 0506 01/03/24 1342  BP: (!) 143/65 (!) 144/67 134/67 (!) 157/71  Pulse: 67 67 68 67  Resp: 15  17 16   Temp: 99.1 F (37.3 C)  99.4 F (37.4 C) 98.5 F (36.9 C)  TempSrc: Oral  Oral Oral   SpO2: 100%  95% 98%  Weight:      Height:       Filed Weights   12/30/23 1605  Weight: 65.1 kg   Exam: General exam: Appears comfortable  HEENT: oral mucosa moist Respiratory system: Clear to auscultation.  Cardiovascular system: S1 & S2 heard  Gastrointestinal system: Abdomen soft, non-tender, nondistended. Normal bowel sounds   Extremities: No cyanosis, clubbing or edema Psychiatry:  Mood & affect appropriate.      CBC: Recent Labs  Lab 12/30/23 0545 12/31/23 0357 01/01/24 0327 01/02/24 0430 01/03/24 0417  WBC 13.5* 18.7* 14.6* 10.0 8.4  NEUTROABS 11.2*  --   --   --   --   HGB 13.8 11.7* 11.2* 11.5* 10.8*  HCT 40.0 35.2* 33.2* 34.0* 31.9*  MCV 100.0 102.9* 101.8* 100.9* 99.4  PLT 250 188 165 190 201   Basic Metabolic Panel: Recent Labs  Lab 12/30/23 0545 12/31/23 0357 01/01/24 0327 01/02/24 0430 01/03/24 0417  NA 135 141 142 141 142  K 3.8 4.1 3.4* 3.3* 3.2*  CL 99 109 112* 109 111  CO2 25 24 25 25 24   GLUCOSE 145* 91 126* 115* 115*  BUN 18 13 10 8  <5*  CREATININE 0.92 1.02* 0.96 0.90 0.84  CALCIUM  9.0 7.9* 7.8* 7.9* 7.9*  MG 2.0  --  2.2 2.2 2.0  PHOS 3.6  --   --   --   --      Scheduled Meds:  pantoprazole  (PROTONIX ) IV  40 mg Intravenous Daily  Imaging and lab data personally reviewed   Author: [employer]  01/03/2024 2:14 PM  To contact [facility]>   Check the care team in [facility] and look for the attending/consulting [facility] provider listed  Log into [email or web address] and use [place]'s universal password   Go to> [facility]  and find provider  If you still have difficulty reaching the provider, please page the [facility] (Director on Call) for the Hospitalists listed on amion

## 2024-01-03 NOTE — Progress Notes (Signed)
 Subjective: No pain today.  Minimal nausea this morning, but resolved currently.   ROS: See above, otherwise other systems negative  Objective: Vital signs in last 24 hours: Temp:  [98.5 F (36.9 C)-99.4 F (37.4 C)] 99.4 F (37.4 C) (12/17 0506) Pulse Rate:  [67-68] 68 (12/17 0506) Resp:  [15-18] 17 (12/17 0506) BP: (132-144)/(65-67) 134/67 (12/17 0506) SpO2:  [95 %-100 %] 95 % (12/17 0506) Last BM Date : 01/03/24  Intake/Output from previous day: 12/16 0701 - 12/17 0700 In: 739.1 [I.V.:484.4; IV Piggyback:254.7] Out: -  Intake/Output this shift: No intake/output data recorded.  PE: Gen: NAD Abd: soft, NT, ND  Lab Results:  Recent Labs    01/02/24 0430 01/03/24 0417  WBC 10.0 8.4  HGB 11.5* 10.8*  HCT 34.0* 31.9*  PLT 190 201   BMET Recent Labs    01/02/24 0430 01/03/24 0417  NA 141 142  K 3.3* 3.2*  CL 109 111  CO2 25 24  GLUCOSE 115* 115*  BUN 8 <5*  CREATININE 0.90 0.84  CALCIUM  7.9* 7.9*   PT/INR No results for input(s): LABPROT, INR in the last 72 hours. CMP     Component Value Date/Time   NA 142 01/03/2024 0417   NA 143 11/16/2023 0900   K 3.2 (L) 01/03/2024 0417   CL 111 01/03/2024 0417   CO2 24 01/03/2024 0417   GLUCOSE 115 (H) 01/03/2024 0417   BUN <5 (L) 01/03/2024 0417   BUN 17 11/16/2023 0900   CREATININE 0.84 01/03/2024 0417   CALCIUM  7.9 (L) 01/03/2024 0417   PROT 5.4 (L) 01/03/2024 0417   PROT 6.8 11/16/2023 0900   ALBUMIN 3.0 (L) 01/03/2024 0417   ALBUMIN 4.5 11/16/2023 0900   AST 45 (H) 01/03/2024 0417   ALT 49 (H) 01/03/2024 0417   ALKPHOS 230 (H) 01/03/2024 0417   BILITOT 1.2 01/03/2024 0417   BILITOT 1.4 (H) 11/16/2023 0900   GFRNONAA >60 01/03/2024 0417   Lipase     Component Value Date/Time   LIPASE 30 01/01/2024 0327       Studies/Results: CT ABDOMEN PELVIS W CONTRAST Result Date: 01/01/2024 EXAM: CT ABDOMEN AND PELVIS WITH CONTRAST 01/01/2024 03:42:48 PM TECHNIQUE: CT of the abdomen and  pelvis was performed with the administration of 100 mL of iohexol  (OMNIPAQUE ) 300 MG/ML solution. Multiplanar reformatted images are provided for review. Automated exposure control, iterative reconstruction, and/or weight-based adjustment of the mA/kV was utilized to reduce the radiation dose to as low as reasonably achievable. COMPARISON: CT Abdomen and Pelvis dated 12/30/2023. CLINICAL HISTORY: Follow-up on perforation from prior CT examination. FINDINGS: LOWER CHEST: A small right-sided pleural effusion is noted, new from the prior exam. No parenchymal nodule is seen. No focal infiltrate is noted. LIVER: The liver is within normal limits. GALLBLADDER AND BILE DUCTS: The gallbladder is well distended with multiple dependent stones. Some pericholecystic fluid is noted, new from the prior exam. Mild perihepatic fluid is noted at the liver tip. Pneumobilia is noted related to recent sphincterotomy. No biliary ductal dilatation. SPLEEN: No acute abnormality. PANCREAS: No acute abnormality. ADRENAL GLANDS: No acute abnormality. KIDNEYS, URETERS AND BLADDER: No stones in the kidneys or ureters. No hydronephrosis. No perinephric or periureteral stranding. Urinary bladder is unremarkable. GI AND BOWEL: A mild amount of fluid is noted surrounding the posterior aspect of the duodenum with small foci of air, similar to that seen on the prior exam. The degree of fluid has increased somewhat in the interval from  the prior study. The stomach and small bowel are otherwise within normal limits. No obstructive or inflammatory changes of the colon are seen. Prominent PERITONEUM AND RETROPERITONEUM: Mild free fluid is noted in the pelvis, new from the prior exam. No free air. VASCULATURE: Aorta is normal in caliber. Aortic calcifications are noted. LYMPH NODES: No lymphadenopathy. REPRODUCTIVE ORGANS: No acute abnormality. BONES AND SOFT TISSUES: No acute osseous abnormality. No focal soft tissue abnormality. IMPRESSION: 1. Mild  fluid surrounding the posterior duodenum with small foci of air, increased from prior, again concerning for duodenal perforation. Increased free fluid in the abdomen and pelvis is noted as well. 2. Gallbladder with multiple dependent stones . New pericholecystic fluid is noted .this is likely related to the changes in the second portion of the duodenum as opposed to true acute cholecystitis. Correlate with the physical exam. 3. New small right-sided pleural effusion. Electronically signed by: Oneil Devonshire MD 01/01/2024 08:15 PM EST RP Workstation: [id number]    Anti-infectives: Anti-infectives (From admission, onward)    Start     Dose/Rate Route Frequency Ordered Stop   12/30/23 1400  piperacillin -tazobactam (ZOSYN ) IVPB 3.375 g        3.375 g 12.5 mL/hr over 240 Minutes Intravenous Every 8 hours 12/30/23 1130     12/30/23 0730  cefTRIAXone  (ROCEPHIN ) 2 g in sodium chloride  0.9 % 100 mL IVPB        2 g 200 mL/hr over 30 Minutes Intravenous  Once 12/30/23 0723 12/30/23 0806   12/30/23 0730  metroNIDAZOLE  (FLAGYL ) IVPB 500 mg        500 mg 100 mL/hr over 60 Minutes Intravenous  Once 12/30/23 9276 12/30/23 9176        Assessment/Plan Duodenal perforation during ERCP for choledocholithiasis -repeat CT scan yesterday revealed slight increase in foci of air around the duodenum with increase in fluid from last imaging. -we would plan to keep her NPO still today and plan for UGI tomorrow - AF without pain, which are all good. -may need picc/TNA if we end up needing to further delay initiation of oral intake if an issue with her UGI.  Will monitor closely  -d/w primary service  FEN - NPO/IVFs per primary VTE - ok for prophylaxis from our standpoint ID - zosyn   HTN GERD HLD  I reviewed Consultant GI notes, hospitalist notes, last 24 h vitals and pain scores, last 48 h intake and output, last 24 h labs and trends, and last 24 h imaging results.   LOS: 4 days    Burnard FORBES Banter ,  [facility] Surgery 01/03/2024, 11:14 AM Please see Amion for pager number during day hours 7:00am-4:30pm or 7:00am -11:30am on weekends

## 2024-01-03 NOTE — Progress Notes (Signed)
 Mobility Specialist - Progress Note   01/03/24 1604  Mobility  Activity Ambulated independently  Level of Assistance Modified independent, requires aide device or extra time  Assistive Device None  Distance Ambulated (ft) 750 ft  Range of Motion/Exercises Active  Activity Response Tolerated well  Mobility visit 1 Mobility  Mobility Specialist Start Time (ACUTE ONLY) 1549  Mobility Specialist Stop Time (ACUTE ONLY) 1604  Mobility Specialist Time Calculation (min) (ACUTE ONLY) 15 min   Pt was found on recliner chair and agreeable to mobilize. No complaints. At EOS returned to recliner chair with all needs met. Call bell in reach.   Erminio Leos,  Mobility Specialist Can be reached via Secure Chat

## 2024-01-04 ENCOUNTER — Inpatient Hospital Stay (HOSPITAL_COMMUNITY)

## 2024-01-04 DIAGNOSIS — K631 Perforation of intestine (nontraumatic): Secondary | ICD-10-CM | POA: Diagnosis not present

## 2024-01-04 LAB — COMPREHENSIVE METABOLIC PANEL WITH GFR
ALT: 60 U/L — ABNORMAL HIGH (ref 0–44)
AST: 57 U/L — ABNORMAL HIGH (ref 15–41)
Albumin: 3.1 g/dL — ABNORMAL LOW (ref 3.5–5.0)
Alkaline Phosphatase: 219 U/L — ABNORMAL HIGH (ref 38–126)
Anion gap: 8 (ref 5–15)
BUN: <5 mg/dL — ABNORMAL LOW (ref 8–23)
CO2: 24 mmol/L (ref 22–32)
Calcium: 8.1 mg/dL — ABNORMAL LOW (ref 8.9–10.3)
Chloride: 110 mmol/L (ref 98–111)
Creatinine, Ser: 0.74 mg/dL (ref 0.44–1.00)
GFR, Estimated: >60 mL/min (ref >60)
Glucose, Bld: 120 mg/dL — ABNORMAL HIGH (ref 70–99)
Potassium: 3.2 mmol/L — ABNORMAL LOW (ref 3.5–5.1)
Sodium: 142 mmol/L (ref 135–145)
Total Bilirubin: 1.1 mg/dL (ref 0.0–1.2)
Total Protein: 5.4 g/dL — ABNORMAL LOW (ref 6.5–8.1)

## 2024-01-04 LAB — MAGNESIUM: Magnesium: 1.9 mg/dL (ref 1.7–2.4)

## 2024-01-04 MED ORDER — POTASSIUM CHLORIDE 10 MEQ/100ML IV SOLN
10.0000 meq | INTRAVENOUS | Status: AC
  Administered 2024-01-04 (×5): 10 meq via INTRAVENOUS
  Filled 2024-01-04 (×5): qty 100

## 2024-01-04 MED ORDER — POTASSIUM CHLORIDE 10 MEQ/100ML IV SOLN
10.0000 meq | Freq: Once | INTRAVENOUS | Status: AC
  Administered 2024-01-04: 16:00:00 10 meq via INTRAVENOUS
  Filled 2024-01-04: qty 100

## 2024-01-04 MED ADMIN — Iohexol Inj 300 MG/ML: 75 mL | ORAL | @ 09:00:00 | NDC 00407141365

## 2024-01-04 NOTE — Progress Notes (Signed)
 Mobility Specialist - Progress Note   01/04/24 1024  Mobility  Activity Ambulated independently  Level of Assistance Modified independent, requires aide device or extra time  Assistive Device None  Distance Ambulated (ft) 750 ft  Range of Motion/Exercises Active  Activity Response Tolerated well  Mobility visit 1 Mobility  Mobility Specialist Start Time (ACUTE ONLY) 0956  Mobility Specialist Stop Time (ACUTE ONLY) 1024  Mobility Specialist Time Calculation (min) (ACUTE ONLY) 28 min   Pt was found in bed and agreeable to mobilize. No complaints. At EOS returned to recliner chair with all needs met. Call bell in reach and RN notified.   Erminio Leos,  Mobility Specialist Can be reached via Secure Chat

## 2024-01-04 NOTE — Progress Notes (Signed)
 Mobility Specialist - Progress Note   01/04/24 1500  Mobility  Activity Ambulated independently  Level of Assistance Modified independent, requires aide device or extra time  Assistive Device None  Distance Ambulated (ft) 750 ft  Range of Motion/Exercises Active  Activity Response Tolerated well  Mobility visit 1 Mobility  Mobility Specialist Start Time (ACUTE ONLY) 1445  Mobility Specialist Stop Time (ACUTE ONLY) 1500  Mobility Specialist Time Calculation (min) (ACUTE ONLY) 15 min   Pt was found on recliner chair and agreeable to mobilize. No complaints. At EOS returned to recliner chair with all needs met. Call bell in reach.   Erminio Leos,  Mobility Specialist Can be reached via Secure Chat

## 2024-01-04 NOTE — Progress Notes (Cosign Needed)
 Subjective: No pain today.  No nausea  ROS: See above, otherwise other systems negative  Objective: Vital signs in last 24 hours: Temp:  [98.5 F (36.9 C)-98.8 F (37.1 C)] 98.8 F (37.1 C) (12/18 0449) Pulse Rate:  [64-69] 69 (12/18 0449) Resp:  [15-16] 15 (12/18 0449) BP: (144-160)/(68-72) 144/72 (12/18 0449) SpO2:  [95 %-100 %] 95 % (12/18 0449) Last BM Date : 01/04/24  Intake/Output from previous day: 12/17 0701 - 12/18 0700 In: 3229.8 [I.V.:2713.9; IV Piggyback:516] Out: -  Intake/Output this shift: No intake/output data recorded.  PE: Gen: NAD Abd: soft, NT, ND  Lab Results:  Recent Labs    01/02/24 0430 01/03/24 0417  WBC 10.0 8.4  HGB 11.5* 10.8*  HCT 34.0* 31.9*  PLT 190 201   BMET Recent Labs    01/03/24 0417 01/04/24 0024  NA 142 142  K 3.2* 3.2*  CL 111 110  CO2 24 24  GLUCOSE 115* 120*  BUN <5* <5*  CREATININE 0.84 0.74  CALCIUM  7.9* 8.1*   PT/INR No results for input(s): LABPROT, INR in the last 72 hours. CMP     Component Value Date/Time   NA 142 01/04/2024 0024   NA 143 11/16/2023 0900   K 3.2 (L) 01/04/2024 0024   CL 110 01/04/2024 0024   CO2 24 01/04/2024 0024   GLUCOSE 120 (H) 01/04/2024 0024   BUN <5 (L) 01/04/2024 0024   BUN 17 11/16/2023 0900   CREATININE 0.74 01/04/2024 0024   CALCIUM  8.1 (L) 01/04/2024 0024   PROT 5.4 (L) 01/04/2024 0024   PROT 6.8 11/16/2023 0900   ALBUMIN 3.1 (L) 01/04/2024 0024   ALBUMIN 4.5 11/16/2023 0900   AST 57 (H) 01/04/2024 0024   ALT 60 (H) 01/04/2024 0024   ALKPHOS 219 (H) 01/04/2024 0024   BILITOT 1.1 01/04/2024 0024   BILITOT 1.4 (H) 11/16/2023 0900   GFRNONAA >60 01/04/2024 0024   Lipase     Component Value Date/Time   LIPASE 30 01/01/2024 0327       Studies/Results: DG UGI W SINGLE CM (SOL OR THIN BA) Result Date: 01/04/2024 CLINICAL DATA:  Peri duodenal gas on CT.  Evaluate for perforation. EXAM: WATER SOLUBLE UPPER GI SERIES TECHNIQUE: Single-column upper  GI series was performed using water soluble contrast. Radiation Exposure Index (as provided by the fluoroscopic device): 0.90 mGy Kerma CONTRAST:  75mL OMNIPAQUE  IOHEXOL  300 MG/ML  SOLN COMPARISON:  CTs including 01/01/2024.  Upper GI of 12/30/2023. FINDINGS: Preprocedure scout film is unremarkable. Focused, single-contrast exam performed with attention concentrated on the duodenum. The duodenum is best evaluated on series 95 and 96. There is no evidence of contrast extravasation to localize site of perforation. IMPRESSION: No evidence of duodenal perforation on focused, single-contrast exam performed with water-soluble contrast. Electronically Signed   By: Rockey Kilts M.D.   On: 01/04/2024 09:09    Anti-infectives: Anti-infectives (From admission, onward)    Start     Dose/Rate Route Frequency Ordered Stop   12/30/23 1400  piperacillin -tazobactam (ZOSYN ) IVPB 3.375 g        3.375 g 12.5 mL/hr over 240 Minutes Intravenous Every 8 hours 12/30/23 1130     12/30/23 0730  cefTRIAXone  (ROCEPHIN ) 2 g in sodium chloride  0.9 % 100 mL IVPB        2 g 200 mL/hr over 30 Minutes Intravenous  Once 12/30/23 0723 12/30/23 0806   12/30/23 0730  metroNIDAZOLE  (FLAGYL ) IVPB 500 mg  500 mg 100 mL/hr over 60 Minutes Intravenous  Once 12/30/23 9276 12/30/23 9176        Assessment/Plan Duodenal perforation during ERCP for choledocholithiasis -UGI negative today for a leak -allow CLD today -patient looks well, hopefully can continue to adv diet over the next couple of days with plans for home this weekend if continues to do well.   -will have her follow up with Dr. Curvin as an outpatient to discuss elective lap chole.  FEN - CLD/IVFs per primary VTE - ok for prophylaxis from our standpoint ID - zosyn   HTN GERD HLD  I reviewed Consultant GI notes, hospitalist notes, last 24 h vitals and pain scores, last 48 h intake and output, last 24 h labs and trends, and last 24 h imaging results.   LOS: 5  days    Burnard FORBES Banter , [facility] Surgery 01/04/2024, 10:58 AM Please see Amion for pager number during day hours 7:00am-4:30pm or 7:00am -11:30am on weekends

## 2024-01-04 NOTE — Progress Notes (Signed)
 Melissa Tapia 10:34 AM  Subjective: Patient doing well no complaints slept well no abdominal pain and we discussed her upper GI and case discussed with the surgical team  Objective: Vital signs stable afebrile no acute distress abdomen is soft nontender K still low LFTs about the same upper GI reviewed  Assessment: Improved post ERCP perforation  Plan: Agree with clear liquid diet and hopefully we can advance her diet further tomorrow and then talk about discharge fairly soon and length of time with antibiotics per surgical team  [facility] E  office [phone] After 5PM or if no answer call [phone]

## 2024-01-04 NOTE — Progress Notes (Signed)
 [facility] Progress Note  Patient: Melissa Tapia     FMW:[id number]  DOA: 12/30/2023   PCP: Shayne Anes, MD       Brief hospital course: This is an 83 year old with Gilbert's syndrome, GERD, choledocholithiasis who underwent an ERCP, sphincterotomy and lithotripsy on 12/29/2023.  She presented to the hospital on 12/13 with abdominal pain, WBC count of 13.5 and a T. bili of 1.9. CT of the abdomen pelvis revealed small volume free air, duodenal fluid and inflammatory changes centered in the duodenum and proximal pancreas raising concern for duodenal perforation. The patient was started on Rocephin  and Flagyl  and then transition to Zosyn .  GI and general surgery consult was requested.  Upper GI did not reveal any extravasation of contrast.  She was continued in her n.p.o. status.  Subjective:  Abdominal pain has improved.  No nausea and vomiting.  She is having some loose stools.  Assessment and Plan: Principal Problem:   Perforation of duodenum - Related to recent ERCP - Appreciate GI and general surgery assistance - Continue Zosyn  and IV fluids while n.p.o. - Appears clinically improved - Upper GI today does not show any perforation- started on liquid diet  Active Problems: Elevated lipase - Possibly secondary to acute pancreatitis versus above process  Cholelithiasis-recent choledocholithiasis - Will need cholecystectomy as outpatient - LFTs improving  Hypokalemia - likely due to poor oral intake - Continue to replace    Essential hypertension   Bertrum syndrome   Gastroesophageal reflux disease without esophagitis   Aortic atherosclerosis   Hyperlipidemia       Code Status: Full Code Total time on patient care: 35 minutes DVT prophylaxis:  SCDs Start: 12/30/23 0812     Objective:   Vitals:   01/03/24 0506 01/03/24 1342 01/03/24 2017 01/04/24 0449  BP: 134/67 (!) 157/71 (!) 160/68 (!) 144/72  Pulse: 68 67 64 69  Resp: 17 16 16 15   Temp: 99.4 F  (37.4 C) 98.5 F (36.9 C) 98.5 F (36.9 C) 98.8 F (37.1 C)  TempSrc: Oral Oral Oral Oral  SpO2: 95% 98% 100% 95%  Weight:      Height:       Filed Weights   12/30/23 1605  Weight: 65.1 kg   Exam: General exam: Appears comfortable  HEENT: oral mucosa moist Respiratory system: Clear to auscultation.  Cardiovascular system: S1 & S2 heard  Gastrointestinal system: Abdomen soft, non-tender, nondistended. Normal bowel sounds   Extremities: No cyanosis, clubbing or edema Psychiatry:  Mood & affect appropriate.      CBC: Recent Labs  Lab 12/30/23 0545 12/31/23 0357 01/01/24 0327 01/02/24 0430 01/03/24 0417  WBC 13.5* 18.7* 14.6* 10.0 8.4  NEUTROABS 11.2*  --   --   --   --   HGB 13.8 11.7* 11.2* 11.5* 10.8*  HCT 40.0 35.2* 33.2* 34.0* 31.9*  MCV 100.0 102.9* 101.8* 100.9* 99.4  PLT 250 188 165 190 201   Basic Metabolic Panel: Recent Labs  Lab 12/30/23 0545 12/31/23 0357 01/01/24 0327 01/02/24 0430 01/03/24 0417 01/04/24 0024  NA 135 141 142 141 142 142  K 3.8 4.1 3.4* 3.3* 3.2* 3.2*  CL 99 109 112* 109 111 110  CO2 25 24 25 25 24 24   GLUCOSE 145* 91 126* 115* 115* 120*  BUN 18 13 10 8  <5* <5*  CREATININE 0.92 1.02* 0.96 0.90 0.84 0.74  CALCIUM  9.0 7.9* 7.8* 7.9* 7.9* 8.1*  MG 2.0  --  2.2 2.2 2.0 1.9  PHOS 3.6  --   --   --   --   --  Scheduled Meds:  pantoprazole  (PROTONIX ) IV  40 mg Intravenous Daily    Imaging and lab data personally reviewed   Author: Mung Rinker  01/04/2024 12:20 PM  To contact [facility]>   Check the care team in [facility] and look for the attending/consulting [facility] provider listed  Log into [email or web address] and use [place]'s universal password   Go to> [facility]  and find provider  If you still have difficulty reaching the provider, please page the [facility] (Director on Call) for the Hospitalists listed on amion

## 2024-01-05 ENCOUNTER — Inpatient Hospital Stay (HOSPITAL_COMMUNITY)

## 2024-01-05 DIAGNOSIS — K631 Perforation of intestine (nontraumatic): Secondary | ICD-10-CM | POA: Diagnosis not present

## 2024-01-05 DIAGNOSIS — N281 Cyst of kidney, acquired: Secondary | ICD-10-CM | POA: Diagnosis not present

## 2024-01-05 DIAGNOSIS — K8020 Calculus of gallbladder without cholecystitis without obstruction: Secondary | ICD-10-CM | POA: Diagnosis not present

## 2024-01-05 DIAGNOSIS — K5730 Diverticulosis of large intestine without perforation or abscess without bleeding: Secondary | ICD-10-CM | POA: Diagnosis not present

## 2024-01-05 DIAGNOSIS — N200 Calculus of kidney: Secondary | ICD-10-CM | POA: Diagnosis not present

## 2024-01-05 LAB — BASIC METABOLIC PANEL WITH GFR
Anion gap: 8 (ref 5–15)
BUN: <5 mg/dL — ABNORMAL LOW (ref 8–23)
CO2: 25 mmol/L (ref 22–32)
Calcium: 8.5 mg/dL — ABNORMAL LOW (ref 8.9–10.3)
Chloride: 109 mmol/L (ref 98–111)
Creatinine, Ser: 0.87 mg/dL (ref 0.44–1.00)
GFR, Estimated: >60 mL/min
Glucose, Bld: 105 mg/dL — ABNORMAL HIGH (ref 70–99)
Potassium: 3.3 mmol/L — ABNORMAL LOW (ref 3.5–5.1)
Sodium: 141 mmol/L (ref 135–145)

## 2024-01-05 MED ORDER — ENSURE PLUS HIGH PROTEIN PO LIQD
237.0000 mL | Freq: Three times a day (TID) | ORAL | Status: DC
  Administered 2024-01-05 – 2024-01-06 (×4): 237 mL via ORAL

## 2024-01-05 MED ORDER — POTASSIUM CHLORIDE CRYS ER 20 MEQ PO TBCR
40.0000 meq | EXTENDED_RELEASE_TABLET | ORAL | Status: AC
  Administered 2024-01-05 (×2): 40 meq via ORAL
  Filled 2024-01-05 (×2): qty 2

## 2024-01-05 NOTE — Progress Notes (Signed)
 Melissa Tapia 11:08 AM  Subjective: Patient doing well tolerated clear liquids yesterday about to try full liquids today and is tolerating Ensure and is walking the hallways and has no pain no new complaints  Objective: Vital signs stable afebrile no acute distress abdomen is soft nontender official x-ray report pending however looks okay to me potassium still little low no other labs  Assessment: Status post ERCP complicated with duodenal perforation currently stable  Plan: Continue management per surgical team with slow advancement of diet await x-ray results will check on tomorrow  [facility] E  office [phone] After 5PM or if no answer call [phone]

## 2024-01-05 NOTE — Progress Notes (Signed)
 "      Subjective: Pt denies abdominal pain, nausea or vomiting. Having bowel function. Tolerating CLD. Would like some ensure today.   Objective: Vital signs in last 24 hours: Temp:  [98.3 F (36.8 C)-98.8 F (37.1 C)] 98.8 F (37.1 C) (12/19 0548) Pulse Rate:  [64-73] 64 (12/19 0548) Resp:  [16-18] 18 (12/19 0548) BP: (134-153)/(63-69) 143/63 (12/19 0548) SpO2:  [97 %-100 %] 97 % (12/19 0548) Last BM Date : 01/04/24  Intake/Output from previous day: 12/18 0701 - 12/19 0700 In: 1823.7 [I.V.:1176; IV Piggyback:647.7] Out: -  Intake/Output this shift: No intake/output data recorded.  PE: Gen: NAD Abd: soft, NT, ND  Lab Results:  Recent Labs    01/03/24 0417  WBC 8.4  HGB 10.8*  HCT 31.9*  PLT 201   BMET Recent Labs    01/04/24 0024 01/05/24 0427  NA 142 141  K 3.2* 3.3*  CL 110 109  CO2 24 25  GLUCOSE 120* 105*  BUN <5* <5*  CREATININE 0.74 0.87  CALCIUM  8.1* 8.5*   PT/INR No results for input(s): LABPROT, INR in the last 72 hours. CMP     Component Value Date/Time   NA 141 01/05/2024 0427   NA 143 11/16/2023 0900   K 3.3 (L) 01/05/2024 0427   CL 109 01/05/2024 0427   CO2 25 01/05/2024 0427   GLUCOSE 105 (H) 01/05/2024 0427   BUN <5 (L) 01/05/2024 0427   BUN 17 11/16/2023 0900   CREATININE 0.87 01/05/2024 0427   CALCIUM  8.5 (L) 01/05/2024 0427   PROT 5.4 (L) 01/04/2024 0024   PROT 6.8 11/16/2023 0900   ALBUMIN 3.1 (L) 01/04/2024 0024   ALBUMIN 4.5 11/16/2023 0900   AST 57 (H) 01/04/2024 0024   ALT 60 (H) 01/04/2024 0024   ALKPHOS 219 (H) 01/04/2024 0024   BILITOT 1.1 01/04/2024 0024   BILITOT 1.4 (H) 11/16/2023 0900   GFRNONAA >60 01/05/2024 0427   Lipase     Component Value Date/Time   LIPASE 30 01/01/2024 0327       Studies/Results: DG UGI W SINGLE CM (SOL OR THIN BA) Result Date: 01/04/2024 CLINICAL DATA:  Peri duodenal gas on CT.  Evaluate for perforation. EXAM: WATER SOLUBLE UPPER GI SERIES TECHNIQUE: Single-column  upper GI series was performed using water soluble contrast. Radiation Exposure Index (as provided by the fluoroscopic device): 0.90 mGy Kerma CONTRAST:  75mL OMNIPAQUE  IOHEXOL  300 MG/ML  SOLN COMPARISON:  CTs including 01/01/2024.  Upper GI of 12/30/2023. FINDINGS: Preprocedure scout film is unremarkable. Focused, single-contrast exam performed with attention concentrated on the duodenum. The duodenum is best evaluated on series 95 and 96. There is no evidence of contrast extravasation to localize site of perforation. IMPRESSION: No evidence of duodenal perforation on focused, single-contrast exam performed with water-soluble contrast. Electronically Signed   By: Rockey Kilts M.D.   On: 01/04/2024 09:09    Anti-infectives: Anti-infectives (From admission, onward)    Start     Dose/Rate Route Frequency Ordered Stop   12/30/23 1400  piperacillin -tazobactam (ZOSYN ) IVPB 3.375 g        3.375 g 12.5 mL/hr over 240 Minutes Intravenous Every 8 hours 12/30/23 1130     12/30/23 0730  cefTRIAXone  (ROCEPHIN ) 2 g in sodium chloride  0.9 % 100 mL IVPB        2 g 200 mL/hr over 30 Minutes Intravenous  Once 12/30/23 0723 12/30/23 0806   12/30/23 0730  metroNIDAZOLE  (FLAGYL ) IVPB 500 mg  500 mg 100 mL/hr over 60 Minutes Intravenous  Once 12/30/23 9276 12/30/23 9176        Assessment/Plan Duodenal perforation during ERCP for choledocholithiasis - UGI 12/18 negative for a leak - adv to FLD and then as tolerated to soft diet, add ensure - Non-contrasted CT to eval fluid around duodenum noted on initial CT 12/15 - patient looks well, hopefully can continue to adv diet over the next couple of days with plans for home this weekend if continues to do well.   - will have her follow up with Dr. Curvin as an outpatient to discuss elective lap chole.  FEN - FLD/IVFs per primary VTE - ok for prophylaxis from our standpoint ID - [email or web address]   HTN GERD HLD  I reviewed Consultant GI notes, hospitalist notes, last  24 h vitals and pain scores, last 48 h intake and output, last 24 h labs and trends, and last 24 h imaging results.   LOS: 6 days    Burnard JONELLE Louder , [facility] 01/05/2024, 9:06 AM Please see Amion for pager number during day hours 7:00am-4:30pm or 7:00am -11:30am on weekends  "

## 2024-01-05 NOTE — Progress Notes (Signed)
 Mobility Specialist - Progress Note   01/05/24 1616  Mobility  Activity Ambulated independently  Level of Assistance Independent after set-up  Assistive Device None  Distance Ambulated (ft) 1200 ft  Activity Response Tolerated well  Mobility Referral Yes  Mobility visit 1 Mobility  Mobility Specialist Start Time (ACUTE ONLY) 1523  Mobility Specialist Stop Time (ACUTE ONLY) 1543  Mobility Specialist Time Calculation (min) (ACUTE ONLY) 20 min   Pt eager to mobilize this afternoon. Requested to use BSC prior to ambulation. Returned to recliner at EOS and was left with call light at side and all needs within reach.   Alfornia EDISON Mobility Specialist Acute Rehabilitation Services Phone: [phone] 01/05/2024, 4:19 PM

## 2024-01-05 NOTE — Progress Notes (Signed)
 " [facility] Progress Note  Patient: Melissa Tapia     FMW:[id number]  DOA: 12/30/2023   PCP: Shayne Anes, MD       Brief hospital course: This is an 83 year old with Gilbert's syndrome, GERD, choledocholithiasis who underwent an ERCP, sphincterotomy and lithotripsy on 12/29/2023.  She presented to the hospital on 12/13 with abdominal pain, WBC count of 13.5 and a T. bili of 1.9. CT of the abdomen pelvis revealed small volume free air, duodenal fluid and inflammatory changes centered in the duodenum and proximal pancreas raising concern for duodenal perforation. The patient was started on Rocephin  and Flagyl  and then transition to Zosyn .  GI and general surgery consult was requested.  Upper GI did not reveal any extravasation of contrast.  She was continued in her n.p.o. status.  Subjective:  Tolerating liquid bowel  Assessment and Plan: Principal Problem:   Perforation of duodenum - Related to recent ERCP - Appreciate GI and general surgery assistance - Continue Zosyn  and IV fluids while n.p.o. - Appears clinically improved - Upper GI does not show any perforation- started on liquid diet-  Active Problems: Elevated lipase - Possibly secondary to acute pancreatitis versus above process  Cholelithiasis-recent choledocholithiasis - Will need cholecystectomy as outpatient - LFTs improving  Hypokalemia - likely due to poor oral intake - Continue to replace    Essential hypertension   Bertrum syndrome   Gastroesophageal reflux disease without esophagitis   Aortic atherosclerosis   Hyperlipidemia       Code Status: Full Code Total time on patient care: 35 minutes DVT prophylaxis:  SCDs Start: 12/30/23 0812     Objective:   Vitals:   01/04/24 0449 01/04/24 1324 01/04/24 2137 01/05/24 0548  BP: (!) 144/72 (!) 153/69 134/65 (!) 143/63  Pulse: 69 73 65 64  Resp: 15 16 16 18   Temp: 98.8 F (37.1 C) 98.3 F (36.8 C) 98.7 F (37.1 C) 98.8 F (37.1 C)   TempSrc: Oral Oral Oral Oral  SpO2: 95% 100% 100% 97%  Weight:      Height:       Filed Weights   12/30/23 1605  Weight: 65.1 kg   Exam: General exam: Appears comfortable  HEENT: oral mucosa moist Respiratory system: Clear to auscultation.  Cardiovascular system: S1 & S2 heard  Gastrointestinal system: Abdomen soft, non-tender, nondistended. Normal bowel sounds   Extremities: No cyanosis, clubbing or edema Psychiatry:  Mood & affect appropriate.      CBC: Recent Labs  Lab 12/30/23 0545 12/31/23 0357 01/01/24 0327 01/02/24 0430 01/03/24 0417  WBC 13.5* 18.7* 14.6* 10.0 8.4  NEUTROABS 11.2*  --   --   --   --   HGB 13.8 11.7* 11.2* 11.5* 10.8*  HCT 40.0 35.2* 33.2* 34.0* 31.9*  MCV 100.0 102.9* 101.8* 100.9* 99.4  PLT 250 188 165 190 201   Basic Metabolic Panel: Recent Labs  Lab 12/30/23 0545 12/31/23 0357 01/01/24 0327 01/02/24 0430 01/03/24 0417 01/04/24 0024 01/05/24 0427  NA 135   < > 142 141 142 142 141  K 3.8   < > 3.4* 3.3* 3.2* 3.2* 3.3*  CL 99   < > 112* 109 111 110 109  CO2 25   < > 25 25 24 24 25   GLUCOSE 145*   < > 126* 115* 115* 120* 105*  BUN 18   < > 10 8 <5* <5* <5*  CREATININE 0.92   < > 0.96 0.90 0.84 0.74 0.87  CALCIUM  9.0   < >  7.8* 7.9* 7.9* 8.1* 8.5*  MG 2.0  --  2.2 2.2 2.0 1.9  --   PHOS 3.6  --   --   --   --   --   --    < > = values in this interval not displayed.     Scheduled Meds:  feeding supplement  237 mL Oral TID BM   pantoprazole  (PROTONIX ) IV  40 mg Intravenous Daily    Imaging and lab data personally reviewed   Author: Donavan Kerlin  01/05/2024 1:13 PM  To contact [facility]>   Check the care team in [facility] and look for the attending/consulting [facility] provider listed  Log into [email or web address] and use [facility] universal password   Go to> [facility]  and find provider  If you still have difficulty reaching the provider, please page the [facility] (Director on Call) for the Hospitalists listed on amion      "

## 2024-01-05 NOTE — Plan of Care (Signed)

## 2024-01-06 ENCOUNTER — Other Ambulatory Visit: Payer: Self-pay

## 2024-01-06 ENCOUNTER — Other Ambulatory Visit (HOSPITAL_COMMUNITY): Payer: Self-pay

## 2024-01-06 DIAGNOSIS — K631 Perforation of intestine (nontraumatic): Secondary | ICD-10-CM | POA: Diagnosis not present

## 2024-01-06 LAB — BASIC METABOLIC PANEL WITH GFR
Anion gap: 8 (ref 5–15)
BUN: 11 mg/dL (ref 8–23)
CO2: 25 mmol/L (ref 22–32)
Calcium: 9.2 mg/dL (ref 8.9–10.3)
Chloride: 107 mmol/L (ref 98–111)
Creatinine, Ser: 0.92 mg/dL (ref 0.44–1.00)
GFR, Estimated: >60 mL/min
Glucose, Bld: 92 mg/dL (ref 70–99)
Potassium: 4.2 mmol/L (ref 3.5–5.1)
Sodium: 141 mmol/L (ref 135–145)

## 2024-01-06 MED ORDER — AMOXICILLIN-POT CLAVULANATE 875-125 MG PO TABS
1.0000 | ORAL_TABLET | Freq: Two times a day (BID) | ORAL | 0 refills | Status: DC
  Filled 2024-01-06: qty 10, 5d supply, fill #0

## 2024-01-06 NOTE — Plan of Care (Signed)

## 2024-01-06 NOTE — Progress Notes (Signed)
 Melissa Tapia 9:58 AM  Subjective: Patient seen and examined and surgical note appreciated and she is doing fine without any pain and tolerating her diet moving her bowels and we answered all of her questions  Objective: Vital signs stable afebrile no acute distress abdomen is soft nontender repeat x-ray and CT okay chemistries okay  Assessment: Improved  Plan: Agree with discharge soon if she tolerates diet and call me as needed if I can be of any further assistance otherwise I will see her back in early January and she knows to call me sooner as needed and we answered all of her questions  [facility]  office [phone] After 5PM or if no answer call [phone]

## 2024-01-06 NOTE — Discharge Summary (Signed)
 Physician Discharge Summary  Melissa Tapia FMW:[id number] DOB: [birth year] DOA: 12/30/2023  PCP: Shayne Anes, MD  Admit date: 12/30/2023 Discharge date: 01/06/2024 Discharging to: home    Consults:  GI General surgery     Discharge Diagnoses:   Principal Problem:   Perforation of duodenum (HCC) Active Problems:   Essential hypertension   Bertrum syndrome   Gastroesophageal reflux disease without esophagitis   Aortic atherosclerosis   Hyperlipidemia   Hyperglycemia   Elevated lipase   Brief hospital course: This is an 83 year old with Gilbert's syndrome, GERD, choledocholithiasis who underwent an ERCP, sphincterotomy and lithotripsy on 12/29/2023.  She presented to the hospital on 12/13 with abdominal pain, WBC count of 13.5 and a T. bili of 1.9. CT of the abdomen pelvis revealed small volume free air, duodenal fluid and inflammatory changes centered in the duodenum and proximal pancreas raising concern for duodenal perforation. The patient was started on Rocephin  and Flagyl  and then transition to Zosyn .  GI and general surgery consult was requested.  Upper GI did not reveal any extravasation of contrast.        Assessment and Plan: Principal Problem:   Perforation of duodenum - Related to recent ERCP - Appreciate GI and general surgery assistance - treated with Zosyn   - Appears clinically improved - Upper GI does not show any perforation- started on liquid diet and advanced to solids - will need Augmentin  for 5 more days and f/u with both GI and Gen surgery - advised to avoid fatty food   Active Problems: Elevated lipase - Possibly secondary to acute pancreatitis versus above process   Cholelithiasis-recent choledocholithiasis - Will need cholecystectomy as outpatient - LFTs improving   Hypokalemia - likely due to poor oral intake - Continue to replace     Essential hypertension   Bertrum syndrome   Gastroesophageal reflux disease without esophagitis   Aortic  atherosclerosis   Hyperlipidemia          Discharge Instructions   Allergies as of 01/06/2024       Reactions   Aspirin Nausea Only   Sulfa Antibiotics Rash   In 1963        Medication List     TAKE these medications    amoxicillin -clavulanate 875-125 MG tablet Commonly known as: AUGMENTIN  Take 1 tablet by mouth 2 (two) times daily.   atorvastatin  20 MG tablet Commonly known as: LIPITOR Take 1 tablet (20 mg total) by mouth at bedtime.   CALTRATE 600 PLUS-VIT D PO Caltrate 600 plus D   Centrum Silver 50+Women Tabs Take 1 tablet by mouth daily.   Cholecalciferol 25 MCG (1000 UT) capsule Take by mouth.   cyanocobalamin  1000 MCG tablet Commonly known as: VITAMIN B12 Take 1 tablet by mouth daily.   B-12 COMPLIANCE INJECTION IJ Inject as directed. Inject every 3 months   docusate sodium 50 MG capsule Commonly known as: COLACE Take 50 mg by mouth daily as needed for moderate constipation.   estradiol 0.01 % Crea vaginal cream Commonly known as: ESTRACE Place 0.5 g vaginally 2 (two) times a week.   Fish Oil 1200 MG Caps Take 1 capsule by mouth daily.   polyethylene glycol powder 17 GM/SCOOP powder Commonly known as: GLYCOLAX/MIRALAX Take 17 g by mouth daily. Dissolve 1 capful (17g) in 4-8 ounces of liquid and take by mouth daily.   Prolia  60 MG/ML Sosy injection Generic drug: denosumab  first was 8/20 Subcutaneous   telmisartan 20 MG tablet Commonly known as: MICARDIS 1/2 TABLET ORALLY  ONCE A DAY IN THE EVENING 90 DAYS Orally Once a day for 90 days        Follow-up Information     Curvin Mt III, MD Follow up on 02/01/2024.   Specialty: General Surgery Why: 3:00pm, Arrive 30 minutes prior to your appointment time, Please bring your insurance card and photo ID Contact information: [address] [place] [address] [phone]                    The results of significant diagnostics from this hospitalization  (including imaging, microbiology, ancillary and laboratory) are listed below for reference.    CT ABDOMEN PELVIS WO CONTRAST Result Date: 01/06/2024 EXAM: CT ABDOMEN AND PELVIS WITHOUT CONTRAST 01/05/2024 02:23:22 PM TECHNIQUE: CT of the abdomen and pelvis was performed without the administration of intravenous contrast. Multiplanar reformatted images are provided for review. Automated exposure control, iterative reconstruction, and/or weight-based adjustment of the mA/kV was utilized to reduce the radiation dose to as low as reasonably achievable. COMPARISON: 01/01/2024 CLINICAL HISTORY: Sepsis; follow up contained duodenal perforation. FINDINGS: LOWER CHEST: Unchanged small right pleural effusion and mild bibasilar atelectasis. LIVER: The liver is unremarkable. GALLBLADDER AND BILE DUCTS: Gallstones are again noted with similar pericholecystic soft tissue haziness and equivocal wall thickening. No bile duct dilatation. New mobility are identified compatible with recent ERCP with sphincterotomy. SPLEEN: No acute abnormality. PANCREAS: No acute abnormality. ADRENAL GLANDS: No acute abnormality. KIDNEYS, URETERS AND BLADDER: Punctate stone is identified within the inferior pole of the unchanged Bosniak class 1 cyst of the lower pole of the right kidney measuring 1.1 cm. Per consensus, no follow-up is needed for simple Bosniak type 1 and 2 renal cysts, unless the patient has a malignancy history or risk factors. No stones in the ureters. No hydronephrosis. No perinephric or periureteral stranding. Urinary bladder is unremarkable. GI AND BOWEL: The stomach appears normal. Enteric contrast material within the stomach. No extravasated contrast material identified. Stable fluid along the posterior aspect of the descending duodenum with a small focus of extraluminal gas. The extraluminal gas is decreased from the previous exam. Fluid extends into the right retroperitoneal space towards the right paracolic gutter,  similar to the previous exam. No dilated bowel loops seen. The appendix is visualized and appears normal. Sigmoid colon demonstrates diverticulosis without evidence of diverticulitis. No bowel wall thickening, pericolonic stranding, abscess, or free air. PERITONEUM AND RETROPERITONEUM: Resolution of free fluid within the pelvis. No free air. VASCULATURE: Aorta is normal in caliber. Aortic atherosclerotic calcification. LYMPH NODES: No lymphadenopathy. REPRODUCTIVE ORGANS: Uterus and adnexal structures are normal. BONES AND SOFT TISSUES: L4-L5 and L5-S1 degenerative disc disease. Unchanged inferior endplate compression deformity involving the L2 vertebra. No focal soft tissue abnormality. IMPRESSION: 1. Stable fluid along the posterior aspect of the descending duodenum with a small focus of extraluminal gas, decreased from the previous exam, compatible with contained duodenal perforation. Fluid extends into the right retroperitoneal space towards the right paracolic gutter, similar to the previous exam. Resolution of free fluid noted within the pelvis. 2. Gallstones with pericholecystic soft tissue haziness and equivocal wall thickening. If there is a clinical concern for acute cholecystitis, consider further evaluation with a nuclear medicine hepatobiliary scan. 3. No bile duct dilatation. New pneumobilia compatible with recent ERCP with sphincterotomy. Electronically signed by: Waddell Calk MD 01/06/2024 07:51 AM EST RP Workstation: [id number]   DG Abd Portable 1V Result Date: 01/05/2024 CLINICAL DATA:  Barium peritonitis. EXAM: PORTABLE ABDOMEN - 1 VIEW COMPARISON:  Radiograph yesterday.  CT 01/01/2024 FINDINGS: Enteric contrast is seen within the colon. No evidence of extraluminal contrast. No bowel dilatation or evidence of obstruction IMPRESSION: Enteric contrast within the colon. No evidence of extraluminal contrast. Electronically Signed   By: Andrea Gasman M.D.   On: 01/05/2024 20:57   DG UGI W  SINGLE CM (SOL OR THIN BA) Result Date: 01/04/2024 CLINICAL DATA:  Peri duodenal gas on CT.  Evaluate for perforation. EXAM: WATER SOLUBLE UPPER GI SERIES TECHNIQUE: Single-column upper GI series was performed using water soluble contrast. Radiation Exposure Index (as provided by the fluoroscopic device): 0.90 mGy Kerma CONTRAST:  75mL OMNIPAQUE  IOHEXOL  300 MG/ML  SOLN COMPARISON:  CTs including 01/01/2024.  Upper GI of 12/30/2023. FINDINGS: Preprocedure scout film is unremarkable. Focused, single-contrast exam performed with attention concentrated on the duodenum. The duodenum is best evaluated on series 95 and 96. There is no evidence of contrast extravasation to localize site of perforation. IMPRESSION: No evidence of duodenal perforation on focused, single-contrast exam performed with water-soluble contrast. Electronically Signed   By: Rockey Kilts M.D.   On: 01/04/2024 09:09   CT ABDOMEN PELVIS W CONTRAST Result Date: 01/01/2024 EXAM: CT ABDOMEN AND PELVIS WITH CONTRAST 01/01/2024 03:42:48 PM TECHNIQUE: CT of the abdomen and pelvis was performed with the administration of 100 mL of iohexol  (OMNIPAQUE ) 300 MG/ML solution. Multiplanar reformatted images are provided for review. Automated exposure control, iterative reconstruction, and/or weight-based adjustment of the mA/kV was utilized to reduce the radiation dose to as low as reasonably achievable. COMPARISON: CT Abdomen and Pelvis dated 12/30/2023. CLINICAL HISTORY: Follow-up on perforation from prior CT examination. FINDINGS: LOWER CHEST: A small right-sided pleural effusion is noted, new from the prior exam. No parenchymal nodule is seen. No focal infiltrate is noted. LIVER: The liver is within normal limits. GALLBLADDER AND BILE DUCTS: The gallbladder is well distended with multiple dependent stones. Some pericholecystic fluid is noted, new from the prior exam. Mild perihepatic fluid is noted at the liver tip. Pneumobilia is noted related to recent  sphincterotomy. No biliary ductal dilatation. SPLEEN: No acute abnormality. PANCREAS: No acute abnormality. ADRENAL GLANDS: No acute abnormality. KIDNEYS, URETERS AND BLADDER: No stones in the kidneys or ureters. No hydronephrosis. No perinephric or periureteral stranding. Urinary bladder is unremarkable. GI AND BOWEL: A mild amount of fluid is noted surrounding the posterior aspect of the duodenum with small foci of air, similar to that seen on the prior exam. The degree of fluid has increased somewhat in the interval from the prior study. The stomach and small bowel are otherwise within normal limits. No obstructive or inflammatory changes of the colon are seen. Prominent PERITONEUM AND RETROPERITONEUM: Mild free fluid is noted in the pelvis, new from the prior exam. No free air. VASCULATURE: Aorta is normal in caliber. Aortic calcifications are noted. LYMPH NODES: No lymphadenopathy. REPRODUCTIVE ORGANS: No acute abnormality. BONES AND SOFT TISSUES: No acute osseous abnormality. No focal soft tissue abnormality. IMPRESSION: 1. Mild fluid surrounding the posterior duodenum with small foci of air, increased from prior, again concerning for duodenal perforation. Increased free fluid in the abdomen and pelvis is noted as well. 2. Gallbladder with multiple dependent stones . New pericholecystic fluid is noted .this is likely related to the changes in the second portion of the duodenum as opposed to true acute cholecystitis. Correlate with the physical exam. 3. New small right-sided pleural effusion. Electronically signed by: Oneil Devonshire MD 01/01/2024 08:15 PM EST RP Workstation: [id number]   DG ERCP Result  Date: 01/01/2024 CLINICAL DATA:  Choledocholithiasis EXAM: ERCP 2 fluoroscopic images of the right upper quadrant obtained intra procedural TECHNIQUE: Multiple spot images obtained with the fluoroscopic device and submitted for interpretation post-procedure. FLUOROSCOPY: Radiation Exposure Index (as provided by  the fluoroscopic device): 24.13 mGy Kerma COMPARISON:  None Available. FINDINGS: Fluoroscopic images demonstrate a flexible endoscopy device with guidewire in the common bile duct. Common bile duct is filled with contrast. Cystic duct gallbladder not visualized. Filling defects within the mildly dilated common bile duct consistent with choledocholithiasis. No contrast extravasation. Balloon sweep of common bile duct. IMPRESSION: ERCP with choledocholithiasis and balloon sweep of the common bile duct. These images were submitted for radiologic interpretation only. Please see the procedural report for the amount of contrast and the fluoroscopy time utilized. Electronically Signed   By: Cordella Banner   On: 01/01/2024 09:36   DG UGI W SINGLE CM (SOL OR THIN BA) Result Date: 12/30/2023 CLINICAL DATA:  Patient s/p ERCP 12/29/23 who presented to the ED this morning with abdominal pain. CT abd/pelvis w/contrast shows concern for possible duodenal perforation. Request for UGI for further evaluation of potential perforation. EXAM: DG UGI W SINGLE CM TECHNIQUE: Scout radiograph was obtained. Single contrast examination was performed using water soluble contrast. This exam was performed by Clotilda Hesselbach, PA-C and was supervised and interpreted by Ester Sides, MD. FLUOROSCOPY: Radiation Exposure Index (as provided by the fluoroscopic device): 100.3 mGy Kerma COMPARISON:  CT abd/pelvis w/contrast 12/30/23 FINDINGS: Scout Radiograph: Multiple leads overlying abdomen. Non-specific bowel gas pattern. Residual IV contrast in the bladder and right renal collecting system. Esophagus: Not assessed on problem focused exam. Esophageal motility: Not assessed on problem focused exam. Stomach: Normal appearance. No hiatal hernia. Gastric emptying: Normal. Duodenum: Normal appearance. No contrast extravasation seen in standing, supine or prone positions. Other:  None. IMPRESSION: Single contrast problem focused UGI without  evidence of contrast extravasation. Electronically Signed   By: Ester Sides M.D.   On: 12/30/2023 10:44   CT ABDOMEN PELVIS W CONTRAST Result Date: 12/30/2023 EXAM: CT ABDOMEN AND PELVIS WITH CONTRAST 12/30/2023 06:30:11 AM TECHNIQUE: CT of the abdomen and pelvis was performed with the administration of 80 mL of iohexol  (OMNIPAQUE ) 300 MG/ML solution. Multiplanar reformatted images are provided for review. Automated exposure control, iterative reconstruction, and/or weight-based adjustment of the mA/kV was utilized to reduce the radiation dose to as low as reasonably achievable. COMPARISON: CT with iv contrast 12/12/2023, MRI abdomen with MRCP 12/23/2023. CLINICAL HISTORY: ERCP yesterday reportedly to remove stones. She had a 4 mm distal common bile duct stone on prior imaging. She is having lower abdominal pain and back pain since the procedure. FINDINGS: LOWER CHEST: No acute abnormality. LIVER: The liver is unremarkable aside from a 5 mm cyst in segment 8. GALLBLADDER AND BILE DUCTS: There is contrast distention of the gallbladder with a small amount of air in the fundus anteriorly. No gallbladder wall thickening. The 4 mm distal common bile duct stone noted on both prior studies is no longer seen. There is pneumobilia in the left lobe compatible with sphincterotomy. Locules of free air are present posterior to the descending duodenum on series 2 axial 32 and 33, another near the porta hepatis on axial 27, and a small amount just above the neck of the gallbladder. Duodenal perforation is strongly considered. There is a small volume of paraduodenal fluid which tracks into the right paracolic gutter. There are inflammatory changes around the duodenum and proximal pancreas probably reactive due to leaked  duodenal contents although a coexisting pancreatitis is not excluded. There is no pancreatic mass or ductal dilatation. SPLEEN: No acute abnormality. PANCREAS: There are inflammatory changes around the  duodenum and proximal pancreas probably reactive due to leaked duodenal contents. A coexisting pancreatitis is not excluded. There is no pancreatic mass or ductal dilatation. ADRENAL GLANDS: No acute abnormality. KIDNEYS, URETERS AND BLADDER: There are scattered Bosniak 1 and 2 cysts bilaterally with no follow-up imaging recommended. No stones in the kidneys or ureters. No hydronephrosis. No perinephric or periureteral stranding. There are low pelvic ureteral insertions consistent with pelvic floor laxity. There is a small cystocele extending below the level of the pelvic floor. The bladder is otherwise unremarkable. Multiple pelvic phleboliths. GI AND BOWEL: Inflammatory changes continue below the level of the duodenum into the right lower quadrant. The gastric wall is unremarkable. There is no small bowel obstruction. The retrocecal appendix is of normal caliber. There is moderate retained stool in the ascending and transverse colon. Sigmoid diverticulosis without diverticulitis. PERITONEUM AND RETROPERITONEUM: There is a small volume of paraduodenal fluid which tracks into the right paracolic gutter. Locules of free air present as described above. Duodenal perforation is strongly considered. VASCULATURE: Aorta is normal in caliber. There is aortoiliac calcific plaque without aneurysm. LYMPH NODES: No lymphadenopathy. REPRODUCTIVE ORGANS: No acute abnormality. BONES AND SOFT TISSUES: There is dextroscoliosis and degenerative change of the lumbar spine, with a mild chronic lower plate and anterior wedge compression fracture deformity of L2. No focal soft tissue abnormality. IMPRESSION: 1. Findings consistent with duodenal perforation, with small-volume free air and paraduodenal fluid, and inflammatory changes centered on the duodenum and proximal pancreas. Acute pancreatitis may also be present. 2. Post-ERCP changes with interval clearance of the prior distal common bile duct stone and pneumobilia compatible with  sphincterotomy. Multiple small stones were noted in the gallbladder on both of the prior studies and are probably being obscured by the contrast. 3. Sigmoid diverticulosis without diverticulitis. Constipation. 4. . Discussed over the phone with physician assistant Gerome at 7:02 am 12/30/2023, with verbal acknowledgement of findings. Electronically signed by: Francis Quam MD 12/30/2023 07:17 AM EST RP Workstation: [id number]   DG C-Arm 1-60 Min-No Report Result Date: 12/29/2023 Fluoroscopy was utilized by the requesting physician.  No radiographic interpretation.   MR ABDOMEN MRCP W WO CONTAST Result Date: 12/23/2023 EXAM: MRCP WITH AND WITHOUT IV CONTRAST 12/23/2023 12:55:00 PM TECHNIQUE: Multisequence, multiplanar magnetic resonance images of the abdomen with and without intravenous contrast. MRCP sequences were performed. 6.0 cc of Vueway  was administered. COMPARISON: CT abdomen and pelvis from 12/12/2023. CLINICAL HISTORY: FINDINGS: LIVER: Small cyst within the right lobe of the liver measures 5 mm, image 11/6. GALLBLADDER AND BILIARY SYSTEM: Gallstones are identified within the dependent portion of the gallbladder. These measure up to 5 mm. No gallbladder wall thickening or pericholecystic inflammation. There is no intrahepatic bile duct dilatation. The common bile duct is nondilated measuring up to 4 mm. Distal common bile duct stone is suspected, measuring 4 mm. Coronal image 22/5. This corresponds to the CT findings from 12/12/2023. SPLEEN: The spleen is within normal limits in size and appearance. PANCREAS/PANCREATIC DUCT: The pancreas is normal in size and contour without focal lesion or ductal dilatation. ADRENAL GLANDS: There is a nodule within the left adrenal gland measuring 0.9 cm. This shows loss of signal on the diaphyseal sequences compatible with a benign adenoma. No follow-up imaging recommended. KIDNEYS: Bilateral Bosniak class 1 and 2 kidney cysts are identified, the largest arises off  the posterolateral cortex of the right kidney, measuring 1.3 cm. Image 17/10. No follow-up imaging recommended for bosniak class 1 and 2 kidney cysts recommended unless otherwise clinically indicated. LYMPH NODES: No enlarged abdominal lymph nodes. VASCULATURE: Unremarkable. PERITONEUM: No ascites. ABDOMINAL WALL: No hernia. No mass. BOWEL: Grossly unremarkable. No bowel obstruction. BONES: The lower thoracic and lumbar spine is convex towards the right. No acute abnormality or worrisome osseous lesion. SOFT TISSUES: Unremarkable. MISCELLANEOUS: Unremarkable. IMPRESSION: 1. Gallstones up to 5 mm without evidence of cholecystitis. 2. 4 mm distal common bile duct stone corresponding to prior CT, with no intrahepatic bile duct dilatation. Electronically signed by: Waddell Calk MD 12/23/2023 01:29 PM EST RP Workstation: [id number]   CT ABDOMEN PELVIS W CONTRAST Result Date: 12/14/2023 CLINICAL DATA:  Unintentional weight loss of 10 lb in past 2 months. Belching. EXAM: CT ABDOMEN AND PELVIS WITH CONTRAST TECHNIQUE: Multidetector CT imaging of the abdomen and pelvis was performed using the standard protocol following bolus administration of intravenous contrast. RADIATION DOSE REDUCTION: This exam was performed according to the departmental dose-optimization program which includes automated exposure control, adjustment of the mA and/or kV according to patient size and/or use of iterative reconstruction technique. CONTRAST:  80mL ISOVUE -370 IOPAMIDOL  (ISOVUE -370) INJECTION 76% COMPARISON:  04/24/2013 FINDINGS: Lower Chest: No acute findings. Hepatobiliary: No suspicious hepatic masses identified. Stable sub-centimeter anterior right hepatic lobe cyst. Gallstones are seen, but without signs of cholecystitis or biliary dilatation. A 2-3 mm calcification is now seen in the location of the distal common bile duct (image 35/2), highly suspicious for choledocholithiasis. Pancreas:  No mass or inflammatory changes. Spleen:  Within normal limits in size and appearance. Adrenals/Urinary Tract: No suspicious masses identified. No evidence of ureteral calculi or hydronephrosis. Unremarkable unopacified urinary bladder. Stomach/Bowel: No evidence of obstruction, inflammatory process or abnormal fluid collections. Normal appendix visualized. Large stool burden seen throughout the colon. Sigmoid diverticulosis again noted without signs of diverticulitis. Vascular/Lymphatic: No pathologically enlarged lymph nodes. No acute vascular findings. Reproductive:  No mass or other significant abnormality. Other:  None. Musculoskeletal: No suspicious bone lesions identified. Old wedge compression fracture of the L2 vertebral body noted. IMPRESSION: Cholelithiasis. No radiographic evidence of cholecystitis. 2-3 mm calcification in location of distal common bile duct, highly suspicious for choledocholithiasis. Recommend correlation with liver function tests, and consider abdomen MRI and MRCP without and with contrast for further evaluation. Sigmoid diverticulosis, without radiographic evidence of diverticulitis. Large stool burden noted; recommend clinical correlation for possible constipation. Electronically Signed   By: Norleen DELENA Kil M.D.   On: 12/14/2023 12:01   Labs:   Basic Metabolic Panel: Recent Labs  Lab 01/01/24 0327 01/02/24 0430 01/03/24 0417 01/04/24 0024 01/05/24 0427 01/06/24 0419  NA 142 141 142 142 141 141  K 3.4* 3.3* 3.2* 3.2* 3.3* 4.2  CL 112* 109 111 110 109 107  CO2 25 25 24 24 25 25   GLUCOSE 126* 115* 115* 120* 105* 92  BUN 10 8 <5* <5* <5* 11  CREATININE 0.96 0.90 0.84 0.74 0.87 0.92  CALCIUM  7.8* 7.9* 7.9* 8.1* 8.5* 9.2  MG 2.2 2.2 2.0 1.9  --   --      CBC: Recent Labs  Lab 12/31/23 0357 01/01/24 0327 01/02/24 0430 01/03/24 0417  WBC 18.7* 14.6* 10.0 8.4  HGB 11.7* 11.2* 11.5* 10.8*  HCT 35.2* 33.2* 34.0* 31.9*  MCV 102.9* 101.8* 100.9* 99.4  PLT 188 165 190 201         SIGNED:   True Atlas, MD  [facility] 01/06/2024, 10:48 AM Time taking on discharge: 50 minutes

## 2024-01-06 NOTE — Progress Notes (Signed)
 Mobility Specialist Progress Note:   01/06/24 1019  Mobility  Activity Ambulated independently  Level of Assistance Independent  Assistive Device None  Distance Ambulated (ft) 800 ft  Activity Response Tolerated well  Mobility Referral Yes  Mobility visit 1 Mobility  Mobility Specialist Start Time (ACUTE ONLY) 0907  Mobility Specialist Stop Time (ACUTE ONLY) [id number]  Mobility Specialist Time Calculation (min) (ACUTE ONLY) 15 min   Pt was received returning from bathroom and agreed to mobility. No complaints/issues during session. Returned to recliner with all needs met. Call bell in reach.  [employer] - Mobility Specialist

## 2024-01-06 NOTE — Progress Notes (Signed)
 "    Subjective/Chief Complaint: Pt feels well Denies abdominal pain Tolerating po   Objective: Vital signs in last 24 hours: Temp:  [98 F (36.7 C)-98.7 F (37.1 C)] 98.7 F (37.1 C) (12/20 0534) Pulse Rate:  [65-77] 65 (12/20 0534) Resp:  [16-18] 17 (12/20 0534) BP: (133-152)/(61-72) 134/62 (12/20 0534) SpO2:  [95 %-100 %] 95 % (12/20 0534) Last BM Date : 01/05/24  Intake/Output from previous day: 12/19 0701 - 12/20 0700 In: 139.5 [IV Piggyback:139.5] Out: -  Intake/Output this shift: No intake/output data recorded.  Exam: Awake and alert Comfortable Abdomen soft, non-tender, no guarding  Lab Results:  No results for input(s): WBC, HGB, HCT, PLT in the last 72 hours. BMET Recent Labs    01/05/24 0427 01/06/24 0419  NA 141 141  K 3.3* 4.2  CL 109 107  CO2 25 25  GLUCOSE 105* 92  BUN <5* 11  CREATININE 0.87 0.92  CALCIUM  8.5* 9.2   PT/INR No results for input(s): LABPROT, INR in the last 72 hours. ABG No results for input(s): PHART, HCO3 in the last 72 hours.  Invalid input(s): PCO2, PO2  Studies/Results: CT ABDOMEN PELVIS WO CONTRAST Result Date: 01/06/2024 EXAM: CT ABDOMEN AND PELVIS WITHOUT CONTRAST 01/05/2024 02:23:22 PM TECHNIQUE: CT of the abdomen and pelvis was performed without the administration of intravenous contrast. Multiplanar reformatted images are provided for review. Automated exposure control, iterative reconstruction, and/or weight-based adjustment of the mA/kV was utilized to reduce the radiation dose to as low as reasonably achievable. COMPARISON: 01/01/2024 CLINICAL HISTORY: Sepsis; follow up contained duodenal perforation. FINDINGS: LOWER CHEST: Unchanged small right pleural effusion and mild bibasilar atelectasis. LIVER: The liver is unremarkable. GALLBLADDER AND BILE DUCTS: Gallstones are again noted with similar pericholecystic soft tissue haziness and equivocal wall thickening. No bile duct dilatation. New  mobility are identified compatible with recent ERCP with sphincterotomy. SPLEEN: No acute abnormality. PANCREAS: No acute abnormality. ADRENAL GLANDS: No acute abnormality. KIDNEYS, URETERS AND BLADDER: Punctate stone is identified within the inferior pole of the unchanged Bosniak class 1 cyst of the lower pole of the right kidney measuring 1.1 cm. Per consensus, no follow-up is needed for simple Bosniak type 1 and 2 renal cysts, unless the patient has a malignancy history or risk factors. No stones in the ureters. No hydronephrosis. No perinephric or periureteral stranding. Urinary bladder is unremarkable. GI AND BOWEL: The stomach appears normal. Enteric contrast material within the stomach. No extravasated contrast material identified. Stable fluid along the posterior aspect of the descending duodenum with a small focus of extraluminal gas. The extraluminal gas is decreased from the previous exam. Fluid extends into the right retroperitoneal space towards the right paracolic gutter, similar to the previous exam. No dilated bowel loops seen. The appendix is visualized and appears normal. Sigmoid colon demonstrates diverticulosis without evidence of diverticulitis. No bowel wall thickening, pericolonic stranding, abscess, or free air. PERITONEUM AND RETROPERITONEUM: Resolution of free fluid within the pelvis. No free air. VASCULATURE: Aorta is normal in caliber. Aortic atherosclerotic calcification. LYMPH NODES: No lymphadenopathy. REPRODUCTIVE ORGANS: Uterus and adnexal structures are normal. BONES AND SOFT TISSUES: L4-L5 and L5-S1 degenerative disc disease. Unchanged inferior endplate compression deformity involving the L2 vertebra. No focal soft tissue abnormality. IMPRESSION: 1. Stable fluid along the posterior aspect of the descending duodenum with a small focus of extraluminal gas, decreased from the previous exam, compatible with contained duodenal perforation. Fluid extends into the right retroperitoneal  space towards the right paracolic gutter, similar to the previous  exam. Resolution of free fluid noted within the pelvis. 2. Gallstones with pericholecystic soft tissue haziness and equivocal wall thickening. If there is a clinical concern for acute cholecystitis, consider further evaluation with a nuclear medicine hepatobiliary scan. 3. No bile duct dilatation. New pneumobilia compatible with recent ERCP with sphincterotomy. Electronically signed by: Waddell Calk MD 01/06/2024 07:51 AM EST RP Workstation: [id number]   DG Abd Portable 1V Result Date: 01/05/2024 CLINICAL DATA:  Barium peritonitis. EXAM: PORTABLE ABDOMEN - 1 VIEW COMPARISON:  Radiograph yesterday.  CT 01/01/2024 FINDINGS: Enteric contrast is seen within the colon. No evidence of extraluminal contrast. No bowel dilatation or evidence of obstruction IMPRESSION: Enteric contrast within the colon. No evidence of extraluminal contrast. Electronically Signed   By: Andrea Gasman M.D.   On: 01/05/2024 20:57   DG UGI W SINGLE CM (SOL OR THIN BA) Result Date: 01/04/2024 CLINICAL DATA:  Peri duodenal gas on CT.  Evaluate for perforation. EXAM: WATER SOLUBLE UPPER GI SERIES TECHNIQUE: Single-column upper GI series was performed using water soluble contrast. Radiation Exposure Index (as provided by the fluoroscopic device): 0.90 mGy Kerma CONTRAST:  75mL OMNIPAQUE  IOHEXOL  300 MG/ML  SOLN COMPARISON:  CTs including 01/01/2024.  Upper GI of 12/30/2023. FINDINGS: Preprocedure scout film is unremarkable. Focused, single-contrast exam performed with attention concentrated on the duodenum. The duodenum is best evaluated on series 95 and 96. There is no evidence of contrast extravasation to localize site of perforation. IMPRESSION: No evidence of duodenal perforation on focused, single-contrast exam performed with water-soluble contrast. Electronically Signed   By: Rockey Kilts M.D.   On: 01/04/2024 09:09    Anti-infectives: Anti-infectives (From  admission, onward)    Start     Dose/Rate Route Frequency Ordered Stop   12/30/23 1400  piperacillin -tazobactam (ZOSYN ) IVPB 3.375 g        3.375 g 12.5 mL/hr over 240 Minutes Intravenous Every 8 hours 12/30/23 1130     12/30/23 0730  cefTRIAXone  (ROCEPHIN ) 2 g in sodium chloride  0.9 % 100 mL IVPB        2 g 200 mL/hr over 30 Minutes Intravenous  Once 12/30/23 0723 12/30/23 0806   12/30/23 0730  metroNIDAZOLE  (FLAGYL ) IVPB 500 mg        500 mg 100 mL/hr over 60 Minutes Intravenous  Once 12/30/23 9276 12/30/23 9176       Assessment/Plan: Duodenal perforation during ERCP for choledocholithiasis - UGI 12/18 negative for a leak -repeat CT yesterday shows decrease in the area around the duodenum  -given clinically how she looks and the improvement on CT, it is ok to discharge home today from a surgical standpoint.  She will follow up in our office with Dr. Curvin in a few weeks and will eventually need a lap chole once she is fully recovered from the perforation.  Would send home on Augmentin  for 5 more days -surgery will sign off and see PRN    Vicenta Poli MD 01/06/2024  "

## 2024-01-06 NOTE — Progress Notes (Signed)
 Discharge meds in a secure bag delivered to patient by this RN

## 2024-01-06 NOTE — Plan of Care (Signed)
 No acute changes overnight Pt expected to discharge today    Problem: Health Behavior/Discharge Planning: Goal: Ability to manage health-related needs will improve Outcome: Adequate for Discharge   Problem: Clinical Measurements: Goal: Will remain free from infection Outcome: Adequate for Discharge   Problem: Clinical Measurements: Goal: Respiratory complications will improve Outcome: Adequate for Discharge

## 2024-02-01 ENCOUNTER — Ambulatory Visit: Payer: Self-pay | Admitting: General Surgery

## 2024-02-06 ENCOUNTER — Ambulatory Visit (HOSPITAL_COMMUNITY)
Admission: RE | Admit: 2024-02-06 | Discharge: 2024-02-06 | Disposition: A | Discharge status: Home / Self Care | Source: Ambulatory Visit | Attending: Internal Medicine | Admitting: Internal Medicine

## 2024-02-06 VITALS — BP 128/71 | HR 89 | Temp 97.4°F | Resp 16

## 2024-02-06 DIAGNOSIS — M810 Age-related osteoporosis without current pathological fracture: Secondary | ICD-10-CM | POA: Diagnosis present

## 2024-02-06 MED ORDER — DENOSUMAB 60 MG/ML ~~LOC~~ SOSY
PREFILLED_SYRINGE | SUBCUTANEOUS | Status: AC
  Filled 2024-02-06: qty 1

## 2024-02-06 MED ORDER — DENOSUMAB 60 MG/ML ~~LOC~~ SOSY
60.0000 mg | PREFILLED_SYRINGE | Freq: Once | SUBCUTANEOUS | Status: AC
  Administered 2024-02-06: 60 mg via SUBCUTANEOUS

## 2024-02-12 ENCOUNTER — Encounter (HOSPITAL_COMMUNITY): Payer: Self-pay | Admitting: General Surgery

## 2024-02-12 ENCOUNTER — Other Ambulatory Visit: Payer: Self-pay

## 2024-02-12 NOTE — Progress Notes (Signed)
 PCP - Shayne Anes, MD  Cardiologist - Michele Richardson, DO   PPM/ICD - denies Device Orders - n/a Rep Notified - n/a  Chest x-ray -  EKG - 12-29-13 Stress Test -  ECHO - 10-16-23 Cardiac Cath -    Dm-denies  Blood Thinner Instructions: denies Aspirin Instructions: denies  ERAS Protcol - clear liquids until 11:00 am  COVID TEST- n/a  Anesthesia review: Yes, hospital stay in Dec.  Patient verbally denies any shortness of breath, fever, cough and chest pain during phone call   -------------  SDW INSTRUCTIONS given:  Your procedure is scheduled on January 11:30, 2026.  Report to [facility] Main Entrance A at 11:30 A.M., and check in at the Admitting office.  Call this number if you have problems the morning of surgery:  [phone]   Remember:  Do not eat after midnight the night before your surgery  You may drink clear liquids until 11:00 the morning of your surgery.   Clear liquids allowed are: Water, Non-Citrus Juices (without pulp), Carbonated Beverages, Clear Tea, Black Coffee Only, and Gatorade    Take these medicines the morning of surgery with A SIP OF WATER   NONE   As of today, STOP taking any Aspirin (unless otherwise instructed by your surgeon) Aleve, Naproxen, Ibuprofen, Motrin, Advil, Goody's, BC's, all herbal medications, fish oil, and all vitamins.                      Do not wear jewelry, make up, or nail polish            Do not wear lotions, powders, perfumes/colognes, or deodorant.            Do not shave 48 hours prior to surgery.  Men may shave face and neck.            Do not bring valuables to the hospital.            [facility] is not responsible for any belongings or valuables.  Do NOT Smoke (Tobacco/Vaping) 24 hours prior to your procedure If you use a CPAP at night, you may bring all equipment for your overnight stay.   Contacts, glasses, dentures or bridgework may not be worn into surgery.      For patients admitted to the hospital,  discharge time will be determined by your treatment team.   Patients discharged the day of surgery will not be allowed to drive home, and someone needs to stay with them for 24 hours.    Special instructions:   [facility]- Preparing For Surgery  Before surgery, you can play an important role. Because skin is not sterile, your skin needs to be as free of germs as possible. You can reduce the number of germs on your skin by washing with CHG (chlorahexidine gluconate) Soap before surgery.  CHG is an antiseptic cleaner which kills germs and bonds with the skin to continue killing germs even after washing.    Oral Hygiene is also important to reduce your risk of infection.  Remember - BRUSH YOUR TEETH THE MORNING OF SURGERY WITH YOUR REGULAR TOOTHPASTE  Please do not use if you have an allergy to CHG or antibacterial soaps. If your skin becomes reddened/irritated stop using the CHG.  Do not shave (including legs and underarms) for at least 48 hours prior to first CHG shower. It is OK to shave your face.  Please follow these instructions carefully.   Shower the OMNICOM  SURGERY and the MORNING OF SURGERY with DIAL Soap.   Pat yourself dry with a CLEAN TOWEL.  Wear CLEAN PAJAMAS to bed the night before surgery  Place CLEAN SHEETS on your bed the night of your first shower and DO NOT SLEEP WITH PETS.   Day of Surgery: Please shower morning of surgery  Wear Clean/Comfortable clothing the morning of surgery Do not apply any deodorants/lotions.   Remember to brush your teeth WITH YOUR REGULAR TOOTHPASTE.   Questions were answered. Patient verbalized understanding of instructions.

## 2024-02-15 ENCOUNTER — Encounter (HOSPITAL_COMMUNITY): Payer: Self-pay

## 2024-02-15 ENCOUNTER — Ambulatory Visit (HOSPITAL_COMMUNITY)
Admission: RE | Admit: 2024-02-15 | Discharge: 2024-02-16 | Disposition: A | Discharge status: Home / Self Care | Attending: General Surgery | Admitting: General Surgery

## 2024-02-15 ENCOUNTER — Other Ambulatory Visit: Payer: Self-pay

## 2024-02-15 ENCOUNTER — Encounter (HOSPITAL_COMMUNITY)
Admission: RE | Disposition: A | Discharge status: Home / Self Care | Payer: Self-pay | Source: Home / Self Care | Attending: General Surgery

## 2024-02-15 ENCOUNTER — Encounter (HOSPITAL_COMMUNITY): Payer: Self-pay | Admitting: General Surgery

## 2024-02-15 ENCOUNTER — Ambulatory Visit (HOSPITAL_COMMUNITY)

## 2024-02-15 DIAGNOSIS — Z87891 Personal history of nicotine dependence: Secondary | ICD-10-CM | POA: Insufficient documentation

## 2024-02-15 DIAGNOSIS — Z8249 Family history of ischemic heart disease and other diseases of the circulatory system: Secondary | ICD-10-CM | POA: Insufficient documentation

## 2024-02-15 DIAGNOSIS — K219 Gastro-esophageal reflux disease without esophagitis: Secondary | ICD-10-CM | POA: Diagnosis not present

## 2024-02-15 DIAGNOSIS — K8010 Calculus of gallbladder with chronic cholecystitis without obstruction: Secondary | ICD-10-CM | POA: Insufficient documentation

## 2024-02-15 DIAGNOSIS — K8020 Calculus of gallbladder without cholecystitis without obstruction: Secondary | ICD-10-CM

## 2024-02-15 DIAGNOSIS — Z79899 Other long term (current) drug therapy: Secondary | ICD-10-CM | POA: Insufficient documentation

## 2024-02-15 DIAGNOSIS — I10 Essential (primary) hypertension: Secondary | ICD-10-CM | POA: Diagnosis not present

## 2024-02-15 DIAGNOSIS — Z01818 Encounter for other preprocedural examination: Secondary | ICD-10-CM

## 2024-02-15 DIAGNOSIS — E039 Hypothyroidism, unspecified: Secondary | ICD-10-CM | POA: Diagnosis not present

## 2024-02-15 LAB — CBC
HCT: 38.6 % (ref 36.0–46.0)
Hemoglobin: 13.6 g/dL (ref 12.0–15.0)
MCH: 34.4 pg — ABNORMAL HIGH (ref 26.0–34.0)
MCHC: 35.2 g/dL (ref 30.0–36.0)
MCV: 97.7 fL (ref 80.0–100.0)
Platelets: 217 10*3/uL (ref 150–400)
RBC: 3.95 MIL/uL (ref 3.87–5.11)
RDW: 11.9 % (ref 11.5–15.5)
WBC: 7.8 10*3/uL (ref 4.0–10.5)
nRBC: 0 % (ref 0.0–0.2)

## 2024-02-15 LAB — BASIC METABOLIC PANEL WITH GFR
Anion gap: 13 (ref 5–15)
BUN: 21 mg/dL (ref 8–23)
CO2: 22 mmol/L (ref 22–32)
Calcium: 9.1 mg/dL (ref 8.9–10.3)
Chloride: 102 mmol/L (ref 98–111)
Creatinine, Ser: 0.91 mg/dL (ref 0.44–1.00)
GFR, Estimated: >60 mL/min
Glucose, Bld: 95 mg/dL (ref 70–99)
Potassium: 3.9 mmol/L (ref 3.5–5.1)
Sodium: 137 mmol/L (ref 135–145)

## 2024-02-15 MED ORDER — FENTANYL CITRATE (PF) 100 MCG/2ML IJ SOLN
INTRAMUSCULAR | Status: DC | PRN
  Administered 2024-02-15 (×2): 50 ug via INTRAVENOUS

## 2024-02-15 MED ORDER — GABAPENTIN 100 MG PO CAPS
100.0000 mg | ORAL_CAPSULE | ORAL | Status: AC
  Administered 2024-02-15: 100 mg via ORAL
  Filled 2024-02-15: qty 1

## 2024-02-15 MED ORDER — CHLORHEXIDINE GLUCONATE 0.12 % MT SOLN
15.0000 mL | Freq: Once | OROMUCOSAL | Status: AC
  Administered 2024-02-15: 15 mL via OROMUCOSAL
  Filled 2024-02-15: qty 15

## 2024-02-15 MED ORDER — BUPIVACAINE-EPINEPHRINE (PF) 0.25% -1:200000 IJ SOLN
INTRAMUSCULAR | Status: AC
  Filled 2024-02-15: qty 30

## 2024-02-15 MED ORDER — MIDAZOLAM HCL 2 MG/2ML IJ SOLN
INTRAMUSCULAR | Status: AC
  Filled 2024-02-15: qty 2

## 2024-02-15 MED ORDER — ORAL CARE MOUTH RINSE: 15.0000 mL | Freq: Once | OROMUCOSAL | Status: AC

## 2024-02-15 MED ORDER — SODIUM CHLORIDE 0.9 % IR SOLN
Status: DC | PRN
  Administered 2024-02-15: 1000 mL

## 2024-02-15 MED ORDER — CHLORHEXIDINE GLUCONATE CLOTH 2 % EX PADS: 6.0000 | MEDICATED_PAD | Freq: Once | CUTANEOUS | Status: DC

## 2024-02-15 MED ORDER — PROPOFOL 500 MG/50ML IV EMUL
INTRAVENOUS | Status: DC | PRN
  Administered 2024-02-15: 120 ug/kg/min via INTRAVENOUS

## 2024-02-15 MED ORDER — ONDANSETRON HCL 4 MG/2ML IJ SOLN
INTRAMUSCULAR | Status: DC | PRN
  Administered 2024-02-15: 4 mg via INTRAVENOUS

## 2024-02-15 MED ORDER — ATORVASTATIN CALCIUM 10 MG PO TABS
20.0000 mg | ORAL_TABLET | Freq: Every day | ORAL | Status: DC
  Administered 2024-02-15: 20 mg via ORAL
  Filled 2024-02-15: qty 2

## 2024-02-15 MED ORDER — HEMOSTATIC AGENTS (NO CHARGE) OPTIME
TOPICAL | Status: DC | PRN
  Administered 2024-02-15: 1 via TOPICAL

## 2024-02-15 MED ORDER — BUPIVACAINE-EPINEPHRINE 0.25% -1:200000 IJ SOLN
INTRAMUSCULAR | Status: DC | PRN
  Administered 2024-02-15: 15 mL

## 2024-02-15 MED ORDER — PANTOPRAZOLE SODIUM 40 MG IV SOLR
40.0000 mg | Freq: Every day | INTRAVENOUS | Status: DC
  Administered 2024-02-15: 40 mg via INTRAVENOUS
  Filled 2024-02-15: qty 10

## 2024-02-15 MED ORDER — ONDANSETRON HCL 4 MG/2ML IJ SOLN: 4.0000 mg | Freq: Four times a day (QID) | INTRAMUSCULAR | Status: DC | PRN

## 2024-02-15 MED ORDER — SUGAMMADEX SODIUM 200 MG/2ML IV SOLN
INTRAVENOUS | Status: DC | PRN
  Administered 2024-02-15: 200 mg via INTRAVENOUS

## 2024-02-15 MED ORDER — DROPERIDOL 2.5 MG/ML IJ SOLN: 0.6250 mg | Freq: Once | INTRAMUSCULAR | Status: DC | PRN

## 2024-02-15 MED ORDER — OXYCODONE HCL 5 MG PO TABS
5.0000 mg | ORAL_TABLET | ORAL | Status: DC | PRN
  Administered 2024-02-15 (×2): 5 mg via ORAL
  Filled 2024-02-15 (×2): qty 1

## 2024-02-15 MED ORDER — SODIUM CHLORIDE 0.9 % IV SOLN
INTRAVENOUS | Status: DC | PRN
  Administered 2024-02-15: 5 mL

## 2024-02-15 MED ORDER — PHENYLEPHRINE HCL (PRESSORS) 10 MG/ML IV SOLN
INTRAVENOUS | Status: DC | PRN
  Administered 2024-02-15: 80 ug via INTRAVENOUS

## 2024-02-15 MED ORDER — 0.9 % SODIUM CHLORIDE (POUR BTL) OPTIME
TOPICAL | Status: DC | PRN
  Administered 2024-02-15: 1000 mL

## 2024-02-15 MED ORDER — MORPHINE SULFATE (PF) 2 MG/ML IV SOLN: 1.0000 mg | INTRAVENOUS | Status: DC | PRN

## 2024-02-15 MED ORDER — FENTANYL CITRATE (PF) 100 MCG/2ML IJ SOLN
25.0000 ug | INTRAMUSCULAR | Status: DC | PRN
  Administered 2024-02-15: 25 ug via INTRAVENOUS

## 2024-02-15 MED ORDER — ENSURE PLUS HIGH PROTEIN PO LIQD
237.0000 mL | Freq: Two times a day (BID) | ORAL | Status: DC
  Administered 2024-02-16: 237 mL via ORAL

## 2024-02-15 MED ORDER — PROPOFOL 10 MG/ML IV BOLUS
INTRAVENOUS | Status: AC
  Filled 2024-02-15: qty 20

## 2024-02-15 MED ORDER — ENOXAPARIN SODIUM 30 MG/0.3ML IJ SOSY
30.0000 mg | PREFILLED_SYRINGE | INTRAMUSCULAR | Status: DC
  Administered 2024-02-16: 30 mg via SUBCUTANEOUS
  Filled 2024-02-15: qty 0.3

## 2024-02-15 MED ORDER — DEXAMETHASONE SOD PHOSPHATE PF 10 MG/ML IJ SOLN
INTRAMUSCULAR | Status: DC | PRN
  Administered 2024-02-15: 5 mg via INTRAVENOUS

## 2024-02-15 MED ORDER — SODIUM CHLORIDE 0.9 % IV SOLN: INTRAVENOUS | Status: DC

## 2024-02-15 MED ORDER — FENTANYL CITRATE (PF) 100 MCG/2ML IJ SOLN
INTRAMUSCULAR | Status: AC
  Filled 2024-02-15: qty 2

## 2024-02-15 MED ORDER — IRBESARTAN 75 MG PO TABS
75.0000 mg | ORAL_TABLET | Freq: Every day | ORAL | Status: DC
  Administered 2024-02-15: 75 mg via ORAL
  Filled 2024-02-15 (×2): qty 1

## 2024-02-15 MED ORDER — ACETAMINOPHEN 500 MG PO TABS
1000.0000 mg | ORAL_TABLET | ORAL | Status: AC
  Administered 2024-02-15: 1000 mg via ORAL
  Filled 2024-02-15: qty 2

## 2024-02-15 MED ORDER — ROCURONIUM BROMIDE 10 MG/ML (PF) SYRINGE
PREFILLED_SYRINGE | INTRAVENOUS | Status: DC | PRN
  Administered 2024-02-15: 50 mg via INTRAVENOUS

## 2024-02-15 MED ORDER — CEFAZOLIN SODIUM-DEXTROSE 2-4 GM/100ML-% IV SOLN
2.0000 g | INTRAVENOUS | Status: AC
  Administered 2024-02-15: 2 g via INTRAVENOUS
  Filled 2024-02-15: qty 100

## 2024-02-15 MED ORDER — LACTATED RINGERS IV SOLN: INTRAVENOUS | Status: DC

## 2024-02-15 MED ORDER — PROPOFOL 10 MG/ML IV BOLUS
INTRAVENOUS | Status: DC | PRN
  Administered 2024-02-15: 130 mg via INTRAVENOUS
  Administered 2024-02-15: 30 mg via INTRAVENOUS

## 2024-02-15 MED ORDER — ONDANSETRON 4 MG PO TBDP: 4.0000 mg | ORAL_TABLET | Freq: Four times a day (QID) | ORAL | Status: DC | PRN

## 2024-02-15 MED ORDER — LIDOCAINE 2% (20 MG/ML) 5 ML SYRINGE
INTRAMUSCULAR | Status: DC | PRN
  Administered 2024-02-15: 60 mg via INTRAVENOUS

## 2024-02-15 NOTE — Anesthesia Preprocedure Evaluation (Signed)
 "                                  Anesthesia Evaluation  Patient identified by MRN, date of birth, ID band Patient awake    Reviewed: Allergy & Precautions, NPO status , Patient's Chart, lab work & pertinent test results  Airway Mallampati: II  TM Distance: >3 FB Neck ROM: Full    Dental no notable dental hx.    Pulmonary neg pulmonary ROS, former smoker   Pulmonary exam normal        Cardiovascular hypertension, Pt. on medications  Rhythm:Regular Rate:Normal     Neuro/Psych negative neurological ROS  negative psych ROS   GI/Hepatic Neg liver ROS,GERD  ,,Cholecystitis    Endo/Other  Hypothyroidism    Renal/GU negative Renal ROS  negative genitourinary   Musculoskeletal negative musculoskeletal ROS (+)    Abdominal Normal abdominal exam  (+)   Peds  Hematology Lab Results      Component                Value               Date                      WBC                      7.8                 02/15/2024                HGB                      13.6                02/15/2024                HCT                      38.6                02/15/2024                MCV                      97.7                02/15/2024                PLT                      217                 02/15/2024              Anesthesia Other Findings   Reproductive/Obstetrics                              Anesthesia Physical Anesthesia Plan  ASA: 2  Anesthesia Plan: General   Post-op Pain Management: Tylenol  PO (pre-op)* and Gabapentin  PO (pre-op)*   Induction: Intravenous  PONV Risk Score and Plan: 3 and Ondansetron , Dexamethasone  and Treatment may vary due to age or medical condition  Airway Management Planned: Mask and Oral ETT  Additional Equipment: None  Intra-op Plan:  Post-operative Plan: Extubation in OR  Informed Consent: I have reviewed the patients History and Physical, chart, labs and discussed the procedure including the  risks, benefits and alternatives for the proposed anesthesia with the patient or authorized representative who has indicated his/her understanding and acceptance.     Dental advisory given  Plan Discussed with: CRNA  Anesthesia Plan Comments:         Anesthesia Quick Evaluation  "

## 2024-02-15 NOTE — H&P (Signed)
 " PROVIDER: DEWARD GARNETTE NULL, MD  MRN: [id number] DOB: 1940/08/16 Subjective   Chief Complaint: Follow-up   History of Present Illness: Melissa Tapia is a 84 y.o. female who is seen today for stones. The patient is an 84 year old white female who was recently hospitalized with gallstones and common bile duct stones. Unfortunately Melissa Tapia developed a small perforation of her duodenum during ERCP. This was managed conservatively and Melissa Tapia recovered nicely. Melissa Tapia is now ready to schedule her definitive surgery to remove the gallbladder. Her liver functions have been normal    Review of Systems: A complete review of systems was obtained from the patient. I have reviewed this information and discussed as appropriate with the patient. See HPI as well for other ROS.  ROS   Medical History: Past Medical History:  Diagnosis Date  Anxiety  Hypertension   There is no problem list on file for this patient.  History reviewed. No pertinent surgical history.   Allergies  Allergen Reactions  Sulfa (Sulfonamide Antibiotics) Hives, Other (See Comments) and Rash  Aspirin Other (See Comments) and Nausea   Current Outpatient Medications on File Prior to Visit  Medication Sig Dispense Refill  atorvastatin  (LIPITOR) 10 MG tablet Take 10 mg by mouth at bedtime  cholecalciferol (VITAMIN D3) 1000 unit capsule Take by mouth  cyanocobalamin  (VITAMIN B12) 1000 MCG tablet Take 1,000 mcg by mouth once daily  denosumab  (PROLIA ) 60 mg/mL inj syringe Inject 60 mg subcutaneously  DOCOSAHEXAENOIC ACID ORAL Take by mouth  docusate (COLACE) 50 MG capsule Take 50 mg by mouth  esomeprazole  (NEXIUM ) 20 MG DR capsule TAKE 1 CAPSULE EVERY DAY AS DIRECTED  estradioL (ESTRACE) 0.01 % (0.1 mg/gram) vaginal cream as directed  fluconazole (DIFLUCAN) 100 MG tablet TAKE 1 TABLET BY MOUTH EVERY DAY FOR 5 DAYS  fluticasone  furoate-vilanteroL (BREO ELLIPTA ) 100-25 mcg/dose DsDv inhaler as directed  fluticasone  propion-salmeteroL  (ADVAIR HFA) 115-21 mcg/actuation inhaler 2 PUFFS INHALATION TWICE A DAY. RINSE MOUTH AFTER USE. 30 DAYS  gabapentin  (NEURONTIN ) 100 MG capsule TAKE 1 CAPSULE AT BEDTIME FOR 1 WEEK THEN INCREASE TO 2 CAPSULES AT BEDTIME  mirtazapine (REMERON) 15 MG tablet Take 15 mg by mouth at bedtime  nystatin (MYCOSTATIN) 100,000 unit/mL suspension SWISH, GARGLE, AND SWALLOW 5 MLS 4 TIMES A DAY AS DIRECTED FOR MOUTH/THROAT  ondansetron  (ZOFRAN ) 4 MG tablet Take 4 mg by mouth every 8 (eight) hours as needed  telmisartan (MICARDIS) 20 MG tablet TAKE 1/2 TABLET ORALLY ONCE A DAY IN THE EVENING 90 DAYS   No current facility-administered medications on file prior to visit.   Family History  Problem Relation Age of Onset  High blood pressure (Hypertension) Mother  Colon cancer Father  Stroke Sister  High blood pressure (Hypertension) Sister  High blood pressure (Hypertension) Brother    Social History   Tobacco Use  Smoking Status Former  Types: Cigarettes  Smokeless Tobacco Never    Social History   Socioeconomic History  Marital status: Widowed  Tobacco Use  Smoking status: Former  Types: Cigarettes  Smokeless tobacco: Never  Substance and Sexual Activity  Alcohol  use: Never  Drug use: Never   Social Drivers of Health   Food Insecurity: No Food Insecurity (12/30/2023)  Received from [facility]  Hunger Vital Sign  Within the past 12 months, you worried that your food would run out before you got the money to buy more.: Never true  Within the past 12 months, the food you bought just didn't last and you didn't have money  to get more.: Never true  Transportation Needs: No Transportation Needs (12/30/2023)  Received from [facility] - Transportation  In the past 12 months, has lack of transportation kept you from medical appointments or from getting medications?: No  In the past 12 months, has lack of transportation kept you from meetings, work, or from getting things needed for  daily living?: No  Social Connections: Moderately Integrated (01/01/2024)  Received from [facility]  Social Connection and Isolation Panel  In a typical week, how many times do you talk on the phone with family, friends, or neighbors?: More than three times a week  How often do you get together with friends or relatives?: Twice a week  How often do you attend church or religious services?: More than 4 times per year  Do you belong to any clubs or organizations such as church groups, unions, fraternal or athletic groups, or school groups?: Yes  How often do you attend meetings of the clubs or organizations you belong to?: More than 4 times per year  Are you married, widowed, divorced, separated, never married, or living with a partner?: Widowed  Housing Stability: Unknown (02/01/2024)  Housing Stability Vital Sign  Homeless in the Last Year: No   Objective:   Vitals:  BP: (!) 146/88  Pulse: 88  Temp: 36.9 C (98.5 F)  Weight: 62.6 kg (138 lb)  Height: 160 cm (5' 3)  PainSc: 0-No pain   Body mass index is 24.45 kg/m.  Physical Exam Vitals reviewed.  Constitutional:  General: Melissa Tapia is not in acute distress. Appearance: Normal appearance.  HENT:  Head: Normocephalic and atraumatic.  Right Ear: External ear normal.  Left Ear: External ear normal.  Nose: Nose normal.  Mouth/Throat:  Mouth: Mucous membranes are moist.  Pharynx: Oropharynx is clear.  Eyes:  General: No scleral icterus. Extraocular Movements: Extraocular movements intact.  Conjunctiva/sclera: Conjunctivae normal.  Pupils: Pupils are equal, round, and reactive to light.  Cardiovascular:  Rate and Rhythm: Normal rate and regular rhythm.  Pulses: Normal pulses.  Heart sounds: Normal heart sounds.  Pulmonary:  Effort: Pulmonary effort is normal. No respiratory distress.  Breath sounds: Normal breath sounds.  Abdominal:  General: Bowel sounds are normal.  Palpations: Abdomen is soft.  Tenderness: There is no  abdominal tenderness.  Musculoskeletal:  General: No swelling, tenderness or deformity. Normal range of motion.  Cervical back: Normal range of motion and neck supple.  Skin: General: Skin is warm and dry.  Coloration: Skin is not jaundiced.  Neurological:  General: No focal deficit present.  Mental Status: Melissa Tapia is alert and oriented to person, place, and time.  Psychiatric:  Mood and Affect: Mood normal.  Behavior: Behavior normal.     Labs, Imaging and Diagnostic Testing:  Assessment and Plan:   Diagnoses and all orders for this visit:  Calculus of gallbladder without cholecystitis without obstruction - [facility] Case Posting Request; Future    The patient appears to have symptomatic gallstones. Melissa Tapia has recovered well from a small duodenal perforation at the time of ERCP. Melissa Tapia is now ready to schedule her definitive surgery to prevent further gallbladder attacks in the future. I have discussed with her in detail the risks and benefits of the operation as well as some of the technical aspects and Melissa Tapia understands and wishes to proceed. We will plan for laparoscopic cholecystectomy with intraoperative cholangiogram in the near future.  "

## 2024-02-15 NOTE — Interval H&P Note (Signed)
 History and Physical Interval Note:  02/15/2024 1:01 PM  Melissa Tapia  has presented today for surgery, with the diagnosis of GALLSTONES.  The various methods of treatment have been discussed with the patient and family. After consideration of risks, benefits and other options for treatment, the patient has consented to  Procedures: LAPAROSCOPIC CHOLECYSTECTOMY WITH INTRAOPERATIVE CHOLANGIOGRAM (N/A) as a surgical intervention.  The patient's history has been reviewed, patient examined, no change in status, stable for surgery.  I have reviewed the patient's chart and labs.  Questions were answered to the patient's satisfaction.     Deward Null III

## 2024-02-15 NOTE — Plan of Care (Signed)

## 2024-02-15 NOTE — Plan of Care (Signed)
 " Problem: Education: Goal: Knowledge of General Education information will improve Description: Including pain rating scale, medication(s)/side effects and non-pharmacologic comfort measures 02/15/2024 1855 by Wilfred Palau, RN Outcome: Progressing 02/15/2024 1841 by Wilfred Palau, RN Outcome: Progressing   Problem: Health Behavior/Discharge Planning: Goal: Ability to manage health-related needs will improve 02/15/2024 1855 by Wilfred Palau, RN Outcome: Progressing 02/15/2024 1841 by Wilfred Palau, RN Outcome: Progressing   Problem: Clinical Measurements: Goal: Ability to maintain clinical measurements within normal limits will improve 02/15/2024 1855 by Wilfred Palau, RN Outcome: Progressing 02/15/2024 1841 by Wilfred Palau, RN Outcome: Progressing Goal: Will remain free from infection 02/15/2024 1855 by Wilfred Palau, RN Outcome: Progressing 02/15/2024 1841 by Wilfred Palau, RN Outcome: Progressing Goal: Diagnostic test results will improve 02/15/2024 1855 by Wilfred Palau, RN Outcome: Progressing 02/15/2024 1841 by Wilfred Palau, RN Outcome: Progressing Goal: Respiratory complications will improve 02/15/2024 1855 by Wilfred Palau, RN Outcome: Progressing 02/15/2024 1841 by Wilfred Palau, RN Outcome: Progressing Goal: Cardiovascular complication will be avoided 02/15/2024 1855 by Wilfred Palau, RN Outcome: Progressing 02/15/2024 1841 by Wilfred Palau, RN Outcome: Progressing   Problem: Activity: Goal: Risk for activity intolerance will decrease 02/15/2024 1855 by Wilfred Palau, RN Outcome: Progressing 02/15/2024 1841 by Wilfred Palau, RN Outcome: Progressing   Problem: Nutrition: Goal: Adequate nutrition will be maintained 02/15/2024 1855 by Wilfred Palau, RN Outcome: Progressing 02/15/2024 1841 by Wilfred Palau, RN Outcome: Progressing   Problem: Coping: Goal: Level of anxiety will decrease 02/15/2024 1855 by Wilfred Palau, RN Outcome: Progressing 02/15/2024 1841 by Wilfred Palau, RN Outcome: Progressing   Problem: Elimination: Goal: Will not experience complications related to bowel motility 02/15/2024 1855 by Wilfred Palau, RN Outcome: Progressing 02/15/2024 1841 by Wilfred Palau, RN Outcome: Progressing Goal: Will not experience complications related to urinary retention 02/15/2024 1855 by Wilfred Palau, RN Outcome: Progressing 02/15/2024 1841 by Wilfred Palau, RN Outcome: Progressing   Problem: Pain Managment: Goal: General experience of comfort will improve and/or be controlled 02/15/2024 1855 by Wilfred Palau, RN Outcome: Progressing 02/15/2024 1841 by Wilfred Palau, RN Outcome: Progressing   Problem: Safety: Goal: Ability to remain free from injury will improve 02/15/2024 1855 by Wilfred Palau, RN Outcome: Progressing 02/15/2024 1841 by Wilfred Palau, RN Outcome: Progressing   Problem: Skin Integrity: Goal: Risk for impaired skin integrity will decrease 02/15/2024 1855 by Wilfred Palau, RN Outcome: Progressing 02/15/2024 1841 by Wilfred Palau, RN Outcome: Progressing   Problem: Education: Goal: Knowledge of the prescribed therapeutic regimen will improve 02/15/2024 1855 by Wilfred Palau, RN Outcome: Progressing 02/15/2024 1841 by Wilfred Palau, RN Outcome: Progressing   Problem: Bowel/Gastric: Goal: Gastrointestinal status for postoperative course will improve 02/15/2024 1855 by Wilfred Palau, RN Outcome: Progressing 02/15/2024 1841 by Wilfred Palau, RN Outcome: Progressing   Problem: Cardiac: Goal: Ability to maintain an adequate cardiac output 02/15/2024 1855 by Wilfred Palau, RN Outcome: Progressing 02/15/2024 1841 by Wilfred Palau, RN Outcome: Progressing Goal: Will show no evidence of cardiac arrhythmias 02/15/2024 1855 by Wilfred Palau, RN Outcome: Progressing 02/15/2024 1841 by Wilfred Palau, RN Outcome:  Progressing   Problem: Nutritional: Goal: Will attain and maintain optimal nutritional status 02/15/2024 1855 by Wilfred Palau, RN Outcome: Progressing 02/15/2024 1841 by Wilfred Palau, RN Outcome: Progressing   Problem: Neurological: Goal: Will regain or maintain usual level of consciousness 02/15/2024 1855 by Wilfred Palau, RN Outcome: Progressing 02/15/2024 1841 by Wilfred Palau, RN Outcome: Progressing   Problem: Clinical Measurements: Goal: Ability to maintain clinical measurements within normal limits 02/15/2024 1855 by Wilfred Palau, RN Outcome: Progressing 02/15/2024  1841 by Wilfred Palau, RN Outcome: Progressing Goal: Postoperative complications will be avoided or minimized 02/15/2024 1855 by Wilfred Palau, RN Outcome: Progressing 02/15/2024 1841 by Wilfred Palau, RN Outcome: Progressing   Problem: Respiratory: Goal: Will regain and/or maintain adequate ventilation 02/15/2024 1855 by Wilfred Palau, RN Outcome: Progressing 02/15/2024 1841 by Wilfred Palau, RN Outcome: Progressing Goal: Respiratory status will improve 02/15/2024 1855 by Wilfred Palau, RN Outcome: Progressing 02/15/2024 1841 by Wilfred Palau, RN Outcome: Progressing   Problem: Skin Integrity: Goal: Demonstrates signs of wound healing without infection 02/15/2024 1855 by Wilfred Palau, RN Outcome: Progressing 02/15/2024 1841 by Wilfred Palau, RN Outcome: Progressing   Problem: Urinary Elimination: Goal: Will remain free from infection 02/15/2024 1855 by Wilfred Palau, RN Outcome: Progressing 02/15/2024 1841 by Wilfred Palau, RN Outcome: Progressing Goal: Ability to achieve and maintain adequate urine output 02/15/2024 1855 by Wilfred Palau, RN Outcome: Progressing 02/15/2024 1841 by Wilfred Palau, RN Outcome: Progressing   "

## 2024-02-15 NOTE — Anesthesia Procedure Notes (Signed)
 Procedure Name: Intubation Date/Time: 02/15/2024 1:57 PM  Performed by: Cetrone, Domenic, RNPre-anesthesia Checklist: Patient identified, Emergency Drugs available, Suction available and Patient being monitored Patient Re-evaluated:Patient Re-evaluated prior to induction Oxygen  Delivery Method: Circle system utilized Preoxygenation: Pre-oxygenation with 100% oxygen  Induction Type: IV induction Ventilation: Mask ventilation without difficulty Laryngoscope Size: Miller and 2 Grade View: Grade I Tube type: Oral Tube size: 7.0 mm Number of attempts: 1 Airway Equipment and Method: Stylet and Oral airway Placement Confirmation: positive ETCO2, breath sounds checked- equal and bilateral and ETT inserted through vocal cords under direct vision Secured at: 21 cm Tube secured with: Tape Dental Injury: Teeth and Oropharynx as per pre-operative assessment

## 2024-02-15 NOTE — Transfer of Care (Signed)
 Immediate Anesthesia Transfer of Care Note  Patient: Melissa Tapia  Procedure(s) Performed: LAPAROSCOPIC CHOLECYSTECTOMY WITH INTRAOPERATIVE CHOLANGIOGRAM  Patient Location: PACU  Anesthesia Type:General  Level of Consciousness: drowsy and patient cooperative  Airway & Oxygen  Therapy: Patient Spontanous Breathing and Patient connected to face mask oxygen   Post-op Assessment: Report given to RN, Post -op Vital signs reviewed and stable, Patient moving all extremities, and Patient moving all extremities X 4  Post vital signs: Reviewed and stable  Last Vitals:  Vitals Value Taken Time  BP 121/55 02/15/24 15:09  Temp    Pulse 60 02/15/24 15:11  Resp 19 02/15/24 15:12  SpO2 100 % 02/15/24 15:11  Vitals shown include unfiled device data.  Last Pain:  Vitals:   02/15/24 1231  TempSrc:   PainSc: 0-No pain         Complications: There were no known notable events for this encounter.

## 2024-02-15 NOTE — Op Note (Signed)
 02/15/2024  1:01 PM  2:52 PM  PATIENT:  Melissa Tapia  84 y.o. female  PRE-OPERATIVE DIAGNOSIS:  GALLSTONES  POST-OPERATIVE DIAGNOSIS:  GALLSTONES  PROCEDURE:  Procedures: LAPAROSCOPIC CHOLECYSTECTOMY WITH INTRAOPERATIVE CHOLANGIOGRAM (N/A)  SURGEON:  Surgeons and Role:    * Curvin Deward MOULD, MD - Primary    * Dasie Leonor CROME, MD  PHYSICIAN ASSISTANT:   ASSISTANTS: Dr. Dasie   ANESTHESIA:   local and general  EBL:  minimal   BLOOD ADMINISTERED:none  DRAINS: none   LOCAL MEDICATIONS USED:  MARCAINE      SPECIMEN:  Source of Specimen:  gallbladder  DISPOSITION OF SPECIMEN:  PATHOLOGY  COUNTS:  YES  TOURNIQUET:  * No tourniquets in log *  DICTATION: .Dragon Dictation    Procedure: After informed consent was obtained the patient was brought to the operating room and placed in the supine position on the operating room table. After adequate induction of general anesthesia the patient's abdomen was prepped with ChloraPrep allowed to dry and draped in usual sterile manner. An appropriate timeout was performed. The area below the umbilicus was infiltrated with quarter percent  Marcaine . A small incision was made with a 15 blade knife. The incision was carried down through the subcutaneous tissue bluntly with a hemostat and [employer] retractors. The linea alba was identified. The linea alba was incised with a 15 blade knife and each side was grasped with Coker clamps. The preperitoneal space was then probed with a hemostat until the peritoneum was opened and access was gained to the abdominal cavity. A 0 Vicryl pursestring stitch was placed in the fascia surrounding the opening. A Hassan cannula was then placed through the opening and anchored in place with the previously placed Vicryl purse string stitch. The abdomen was insufflated with carbon dioxide without difficulty. A laparoscope was inserted through the [facility] cannula in the right upper quadrant was inspected. Next the  epigastric region was infiltrated with % Marcaine . A small incision was made with a 15 blade knife. A 5 mm port was placed bluntly through this incision into the abdominal cavity under direct vision. Next 2 sites were chosen laterally on the right side of the abdomen for placement of 5 mm ports. Each of these areas was infiltrated with quarter percent Marcaine . Small stab incisions were made with a 15 blade knife. 5 mm ports were then placed bluntly through these incisions into the abdominal cavity under direct vision without difficulty. A blunt grasper was placed through the lateralmost 5 mm port and used to grasp the dome of the gallbladder and elevate it anteriorly and superiorly. Another blunt grasper was placed through the other 5 mm port and used to retract the body and neck of the gallbladder. A dissector was placed through the epigastric port and using the electrocautery the peritoneal reflection at the gallbladder neck was opened. Blunt dissection was then carried out in this area until the gallbladder neck-cystic duct junction was readily identified and a good window was created. A single clip was placed on the gallbladder neck. A small  ductotomy was made just below the clip with laparoscopic scissors. A 14-gauge Angiocath was then placed through the anterior abdominal wall under direct vision. A Reddick cholangiogram catheter was then placed through the Angiocath and flushed. The catheter was then placed in the cystic duct and anchored in place with a clip. A cholangiogram was obtained that showed no filling defects, good emptying into the duodenum, and an adequate length on the cystic duct. The  anchoring clip and catheters were then removed from the patient. 3 clips were placed proximally on the cystic duct and the duct was divided between the 2 sets of clips.  The clips did not seem quite wide enough to completely occlude the duct.  I placed a 0 PDS Endoloop around the end of the cystic duct and  cinched this down.  Posterior to this the cystic artery was identified and again dissected bluntly in a circumferential manner until a good window  was created. 2 clips were placed proximally and one distally on the artery and the artery was divided between the 2 sets of clips. Next a laparoscopic hook cautery device was used to separate the gallbladder from the liver bed. Prior to completely detaching the gallbladder from the liver bed the liver bed was inspected and several small bleeding points were coagulated with the electrocautery until the area was completely hemostatic.  I also applied Surgicel powder to the liver bed.  The gallbladder was then detached the rest of it from the liver bed without difficulty. A laparoscopic bag was inserted through the hassan port. The laparoscope was moved to the epigastric port. The gallbladder was placed within the bag and the bag was sealed.  The bag with the gallbladder was then removed with the [facility] cannula through the infraumbilical port without difficulty. The fascial defect was then closed with the previously placed Vicryl pursestring stitch as well as with another figure-of-eight 0 Vicryl stitch. The liver bed was inspected again and found to be hemostatic. The abdomen was irrigated with copious amounts of saline until the effluent was clear. The ports were then removed under direct vision without difficulty and were found to be hemostatic. The gas was allowed to escape. No other abnormalities were noted on general inspection of the abdomen. The skin incisions were all closed with interrupted 4-0 Monocryl subcuticular stitches. Dermabond dressings were applied. The patient tolerated the procedure well. At the end of the case all needle sponge and instrument counts were correct. The patient was then awakened and taken to recovery in stable condition  PLAN OF CARE: Admit for overnight observation  PATIENT DISPOSITION:  PACU - hemodynamically stable.   Delay  start of Pharmacological VTE agent (>24hrs) due to surgical blood loss or risk of bleeding: no

## 2024-02-16 ENCOUNTER — Encounter (HOSPITAL_COMMUNITY): Payer: Self-pay | Admitting: General Surgery

## 2024-02-16 DIAGNOSIS — K8010 Calculus of gallbladder with chronic cholecystitis without obstruction: Secondary | ICD-10-CM | POA: Diagnosis not present

## 2024-02-16 LAB — GLUCOSE, CAPILLARY: Glucose-Capillary: 118 mg/dL — ABNORMAL HIGH (ref 70–99)

## 2024-02-16 MED ORDER — OXYCODONE HCL 5 MG PO TABS: 5.0000 mg | ORAL_TABLET | Freq: Four times a day (QID) | ORAL | 0 refills | Status: AC | PRN

## 2024-02-16 NOTE — Plan of Care (Signed)

## 2024-02-16 NOTE — Plan of Care (Signed)
" °  Problem: Education: Goal: Knowledge of General Education information will improve Description: Including pain rating scale, medication(s)/side effects and non-pharmacologic comfort measures Outcome: Progressing   Problem: Health Behavior/Discharge Planning: Goal: Ability to manage health-related needs will improve Outcome: Progressing   Problem: Clinical Measurements: Goal: Ability to maintain clinical measurements within normal limits will improve Outcome: Progressing Goal: Will remain free from infection Outcome: Progressing Goal: Diagnostic test results will improve Outcome: Progressing Goal: Respiratory complications will improve Outcome: Progressing Goal: Cardiovascular complication will be avoided Outcome: Progressing   Problem: Coping: Goal: Level of anxiety will decrease Outcome: Progressing   Problem: Elimination: Goal: Will not experience complications related to bowel motility Outcome: Progressing Goal: Will not experience complications related to urinary retention Outcome: Progressing   Problem: Cardiac: Goal: Ability to maintain an adequate cardiac output Outcome: Progressing Goal: Will show no evidence of cardiac arrhythmias Outcome: Progressing   Problem: Nutritional: Goal: Will attain and maintain optimal nutritional status Outcome: Progressing   Problem: Neurological: Goal: Will regain or maintain usual level of consciousness Outcome: Progressing   "

## 2024-02-16 NOTE — Care Management Obs Status (Signed)
 MEDICARE OBSERVATION STATUS NOTIFICATION   Patient Details  Name: Melissa Tapia MRN: [id number] Date of Birth: November 12, 1940   Medicare Observation Status Notification Given:  Yes Obs notice signed and copy provided,   Claretta Deed 02/16/2024, 11:54 AM

## 2024-02-16 NOTE — Progress Notes (Signed)
 1 Day Post-Op   Subjective/Chief Complaint: Nauseated overnight but better this am   Objective: Vital signs in last 24 hours: Temp:  [97.6 F (36.4 C)-98.6 F (37 C)] 98.4 F (36.9 C) (01/30 0751) Pulse Rate:  [61-82] 67 (01/30 0751) Resp:  [15-20] 17 (01/30 0600) BP: (110-151)/(55-76) 110/55 (01/30 0751) SpO2:  [95 %-99 %] 97 % (01/30 0751) Weight:  [59 kg] 59 kg (01/29 1156)    Intake/Output from previous day: 01/29 0701 - 01/30 0700 In: 1457.4 [P.O.:240; I.V.:1117.4; IV Piggyback:100] Out: 300 [Urine:300] Intake/Output this shift: No intake/output data recorded.  General appearance: alert and cooperative Resp: clear to auscultation bilaterally Cardio: regular rate and rhythm GI: soft, mild tenderness  Lab Results:  Recent Labs    02/15/24 1159  WBC 7.8  HGB 13.6  HCT 38.6  PLT 217   BMET Recent Labs    02/15/24 1159  NA 137  K 3.9  CL 102  CO2 22  GLUCOSE 95  BUN 21  CREATININE 0.91  CALCIUM  9.1   PT/INR No results for input(s): LABPROT, INR in the last 72 hours. ABG No results for input(s): PHART, HCO3 in the last 72 hours.  Invalid input(s): PCO2, PO2  Studies/Results: DG Cholangiogram Operative Result Date: 02/15/2024 EXAM: INTRAOPERATIVE CHOLANGIOGRAM 02/15/2024 03:30:06 PM TECHNIQUE: Fluoroscopy provided by the radiology department. Radiologist was not present during procedure. A single spot film was obtained during an intraoperative cholangiogram and submitted for review. FLUOROSCOPY DOSE AND TYPE: Fluoroscopy time 9 seconds. Radiation Dose Index: Reference Air Kerma (in mGy) = 1.36. COMPARISON: None available. CLINICAL HISTORY: Cholecystectomy. FINDINGS: Status post cholecystectomy without leak. The cystic duct remnant is well visualized. The intra- and extrahepatic biliary tree is within normal limits. No filling defects are seen. Free flow of contrast into the duodenum is noted. IMPRESSION: 1. Unremarkable intraoperative  cholangiogram following cholecystectomy. Electronically signed by: Oneil Devonshire MD 02/15/2024 09:32 PM EST RP Workstation: [id number] [id number] C-Arm [id number] Min-No Report Result Date: 02/15/2024 Fluoroscopy was utilized by the requesting physician.  No radiographic interpretation.    Anti-infectives: Anti-infectives (From admission, onward)    Start     Dose/Rate Route Frequency Ordered Stop   02/15/24 1200  ceFAZolin  (ANCEF ) IVPB 2g/100 mL premix        2 g 200 mL/hr over 30 Minutes Intravenous On call to O.R. 02/15/24 1158 02/15/24 1410       Assessment/Plan: s/p Procedures: LAPAROSCOPIC CHOLECYSTECTOMY WITH INTRAOPERATIVE CHOLANGIOGRAM (N/A) Advance diet Plan for d/c later today  LOS: 0 days    Deward Null III 02/16/2024

## 2024-02-18 ENCOUNTER — Encounter (HOSPITAL_COMMUNITY): Payer: Self-pay | Admitting: General Surgery

## 2024-02-19 LAB — SURGICAL PATHOLOGY

## 2024-02-19 NOTE — Anesthesia Postprocedure Evaluation (Signed)
"   Anesthesia Post Note  Patient: Melissa Tapia  Procedure(s) Performed: LAPAROSCOPIC CHOLECYSTECTOMY WITH INTRAOPERATIVE CHOLANGIOGRAM     Patient location during evaluation: PACU Anesthesia Type: General Level of consciousness: awake and alert Pain management: pain level controlled Vital Signs Assessment: post-procedure vital signs reviewed and stable Respiratory status: spontaneous breathing, nonlabored ventilation, respiratory function stable and patient connected to nasal cannula oxygen  Cardiovascular status: blood pressure returned to baseline and stable Postop Assessment: no apparent nausea or vomiting Anesthetic complications: no   There were no known notable events for this encounter.  Last Vitals:  Vitals:   02/16/24 0600 02/16/24 0751  BP: (!) 112/55 (!) 110/55  Pulse: 70 67  Resp: 17   Temp: 37 C 36.9 C  SpO2: 97% 97%    Last Pain:  Vitals:   02/16/24 0751  TempSrc: Oral  PainSc: 0-No pain                 Cordella SQUIBB Ferrell Flam      "

## 2024-03-11 ENCOUNTER — Ambulatory Visit: Admitting: Neurology

## 2024-08-06 ENCOUNTER — Encounter (HOSPITAL_COMMUNITY)
